# Patient Record
Sex: Male | Born: 1935 | Race: White | Hispanic: No | Marital: Married | Smoking: Former smoker
Health system: Southern US, Community
[De-identification: ages and names within clinical notes are randomized; demographics above are authoritative.]

## PROBLEM LIST (undated history)

## (undated) DIAGNOSIS — Z87898 Personal history of other specified conditions: Secondary | ICD-10-CM

## (undated) DIAGNOSIS — E119 Type 2 diabetes mellitus without complications: Secondary | ICD-10-CM

## (undated) DIAGNOSIS — R011 Cardiac murmur, unspecified: Secondary | ICD-10-CM

## (undated) DIAGNOSIS — N400 Enlarged prostate without lower urinary tract symptoms: Secondary | ICD-10-CM

## (undated) DIAGNOSIS — F0390 Unspecified dementia without behavioral disturbance: Secondary | ICD-10-CM

## (undated) DIAGNOSIS — I219 Acute myocardial infarction, unspecified: Secondary | ICD-10-CM

## (undated) DIAGNOSIS — G20A1 Parkinson's disease without dyskinesia, without mention of fluctuations: Secondary | ICD-10-CM

## (undated) DIAGNOSIS — R269 Unspecified abnormalities of gait and mobility: Secondary | ICD-10-CM

## (undated) DIAGNOSIS — I2510 Atherosclerotic heart disease of native coronary artery without angina pectoris: Secondary | ICD-10-CM

## (undated) DIAGNOSIS — C3490 Malignant neoplasm of unspecified part of unspecified bronchus or lung: Secondary | ICD-10-CM

## (undated) DIAGNOSIS — I10 Essential (primary) hypertension: Secondary | ICD-10-CM

## (undated) DIAGNOSIS — N529 Male erectile dysfunction, unspecified: Secondary | ICD-10-CM

## (undated) DIAGNOSIS — E785 Hyperlipidemia, unspecified: Secondary | ICD-10-CM

## (undated) DIAGNOSIS — G20 Parkinson's disease: Secondary | ICD-10-CM

## (undated) DIAGNOSIS — M722 Plantar fascial fibromatosis: Secondary | ICD-10-CM

## (undated) DIAGNOSIS — H9190 Unspecified hearing loss, unspecified ear: Secondary | ICD-10-CM

## (undated) DIAGNOSIS — K635 Polyp of colon: Secondary | ICD-10-CM

## (undated) DIAGNOSIS — C801 Malignant (primary) neoplasm, unspecified: Secondary | ICD-10-CM

## (undated) HISTORY — DX: Male erectile dysfunction, unspecified: N52.9

## (undated) HISTORY — DX: Polyp of colon: K63.5

## (undated) HISTORY — DX: Personal history of other specified conditions: Z87.898

## (undated) HISTORY — PX: TONSILLECTOMY AND ADENOIDECTOMY: SUR1326

## (undated) HISTORY — DX: Hyperlipidemia, unspecified: E78.5

## (undated) HISTORY — DX: Malignant neoplasm of unspecified part of unspecified bronchus or lung: C34.90

## (undated) HISTORY — DX: Plantar fascial fibromatosis: M72.2

## (undated) HISTORY — DX: Unspecified abnormalities of gait and mobility: R26.9

## (undated) HISTORY — PX: CORONARY ANGIOPLASTY: SHX604

## (undated) HISTORY — DX: Atherosclerotic heart disease of native coronary artery without angina pectoris: I25.10

## (undated) HISTORY — PX: HEMORRHOID SURGERY: SHX153

## (undated) HISTORY — DX: Type 2 diabetes mellitus without complications: E11.9

## (undated) HISTORY — DX: Essential (primary) hypertension: I10

## (undated) HISTORY — PX: OTHER SURGICAL HISTORY: SHX169

---

## 2003-11-13 ENCOUNTER — Inpatient Hospital Stay: Payer: Self-pay | Admitting: Internal Medicine

## 2003-11-14 ENCOUNTER — Other Ambulatory Visit: Payer: Self-pay

## 2003-11-15 ENCOUNTER — Other Ambulatory Visit: Payer: Self-pay

## 2003-12-11 ENCOUNTER — Encounter: Payer: Self-pay | Admitting: Cardiology

## 2004-01-03 ENCOUNTER — Encounter: Payer: Self-pay | Admitting: Cardiology

## 2004-02-03 ENCOUNTER — Encounter: Payer: Self-pay | Admitting: Cardiology

## 2004-03-05 ENCOUNTER — Encounter: Payer: Self-pay | Admitting: Cardiology

## 2005-09-01 ENCOUNTER — Ambulatory Visit: Payer: Self-pay | Admitting: Gastroenterology

## 2008-12-19 ENCOUNTER — Ambulatory Visit: Payer: Self-pay | Admitting: Gastroenterology

## 2008-12-19 LAB — HM COLONOSCOPY

## 2010-09-12 ENCOUNTER — Ambulatory Visit: Payer: Medicare Other | Attending: Neurology | Admitting: Physical Therapy

## 2010-09-12 DIAGNOSIS — G20A1 Parkinson's disease without dyskinesia, without mention of fluctuations: Secondary | ICD-10-CM | POA: Insufficient documentation

## 2010-09-12 DIAGNOSIS — R269 Unspecified abnormalities of gait and mobility: Secondary | ICD-10-CM | POA: Insufficient documentation

## 2010-09-12 DIAGNOSIS — G20 Parkinson's disease: Secondary | ICD-10-CM | POA: Insufficient documentation

## 2010-09-12 DIAGNOSIS — IMO0001 Reserved for inherently not codable concepts without codable children: Secondary | ICD-10-CM | POA: Insufficient documentation

## 2010-09-16 ENCOUNTER — Ambulatory Visit: Payer: Medicare Other | Admitting: Physical Therapy

## 2010-09-18 ENCOUNTER — Ambulatory Visit: Payer: Medicare Other | Admitting: Physical Therapy

## 2010-09-22 ENCOUNTER — Ambulatory Visit: Payer: Medicare Other | Admitting: Physical Therapy

## 2010-09-24 ENCOUNTER — Ambulatory Visit: Payer: Medicare Other | Admitting: Physical Therapy

## 2010-09-26 ENCOUNTER — Ambulatory Visit: Payer: Medicare Other | Admitting: Physical Therapy

## 2010-09-29 ENCOUNTER — Ambulatory Visit: Payer: Medicare Other | Admitting: Physical Therapy

## 2010-10-02 ENCOUNTER — Ambulatory Visit: Payer: Medicare Other | Admitting: Physical Therapy

## 2010-10-07 ENCOUNTER — Ambulatory Visit: Payer: Medicare Other | Attending: Neurology | Admitting: Physical Therapy

## 2010-10-07 DIAGNOSIS — IMO0001 Reserved for inherently not codable concepts without codable children: Secondary | ICD-10-CM | POA: Insufficient documentation

## 2010-10-07 DIAGNOSIS — G20A1 Parkinson's disease without dyskinesia, without mention of fluctuations: Secondary | ICD-10-CM | POA: Insufficient documentation

## 2010-10-07 DIAGNOSIS — G20 Parkinson's disease: Secondary | ICD-10-CM | POA: Insufficient documentation

## 2010-10-07 DIAGNOSIS — R269 Unspecified abnormalities of gait and mobility: Secondary | ICD-10-CM | POA: Insufficient documentation

## 2010-10-09 ENCOUNTER — Ambulatory Visit: Payer: Medicare Other | Admitting: Physical Therapy

## 2010-10-13 ENCOUNTER — Ambulatory Visit: Payer: Medicare Other | Admitting: Physical Therapy

## 2010-10-14 ENCOUNTER — Ambulatory Visit: Payer: Medicare Other | Admitting: Physical Therapy

## 2010-10-15 ENCOUNTER — Ambulatory Visit: Payer: Medicare Other | Admitting: Physical Therapy

## 2010-12-11 ENCOUNTER — Emergency Department (HOSPITAL_COMMUNITY)
Admission: EM | Admit: 2010-12-11 | Discharge: 2010-12-11 | Disposition: A | Discharge status: Home / Self Care | Payer: Medicare Other | Attending: Emergency Medicine | Admitting: Emergency Medicine

## 2010-12-11 ENCOUNTER — Encounter: Payer: Self-pay | Admitting: *Deleted

## 2010-12-11 ENCOUNTER — Emergency Department (HOSPITAL_COMMUNITY): Payer: Medicare Other

## 2010-12-11 DIAGNOSIS — G20A1 Parkinson's disease without dyskinesia, without mention of fluctuations: Secondary | ICD-10-CM | POA: Insufficient documentation

## 2010-12-11 DIAGNOSIS — G20 Parkinson's disease: Secondary | ICD-10-CM | POA: Insufficient documentation

## 2010-12-11 DIAGNOSIS — W19XXXA Unspecified fall, initial encounter: Secondary | ICD-10-CM

## 2010-12-11 DIAGNOSIS — S42009A Fracture of unspecified part of unspecified clavicle, initial encounter for closed fracture: Secondary | ICD-10-CM | POA: Insufficient documentation

## 2010-12-11 DIAGNOSIS — W010XXA Fall on same level from slipping, tripping and stumbling without subsequent striking against object, initial encounter: Secondary | ICD-10-CM | POA: Insufficient documentation

## 2010-12-11 HISTORY — DX: Malignant (primary) neoplasm, unspecified: C80.1

## 2010-12-11 MED ORDER — OXYCODONE-ACETAMINOPHEN 5-325 MG PO TABS: 1.0000 | ORAL_TABLET | ORAL | Status: AC | PRN

## 2010-12-11 NOTE — ED Provider Notes (Signed)
History     CSN: [id number] Arrival date & time: 12/11/2010  3:07 PM   First MD Initiated Contact with Patient 12/11/10 1525      Chief Complaint  Patient presents with  . Fall    left shoulder dislocation     HPI Patient presented via EMS after suffering a mechanical fall.  Patient was trying to tie a shoe on he stumbled and fell striking his left shoulder and head.  Patient has some mild tenderness and swelling to the left shoulder.  Patient denies loss of consciousness, headache, vomiting.  Had some mild nausea.  That has subsided.  Patient has no other complaints of pain.  Patient has history of Parkinson's disease is controlled with medication. Past Medical History  Diagnosis Date  . Cancer   . Blood transfusion   . Parkinson's disease     Past Surgical History  Procedure Date  . Cardicac stent     History reviewed. No pertinent family history.  History  Substance Use Topics  . Smoking status: Never Smoker   . Smokeless tobacco: Never Used  . Alcohol Use: No      Review of Systems  HENT: Negative for neck pain.   Cardiovascular: Negative for chest pain.  All other systems reviewed and are negative.    Allergies  Review of patient's allergies indicates no known allergies.  Home Medications   Current Outpatient Rx  Name Route Sig Dispense Refill  . CLOPIDOGREL BISULFATE 300 MG PO TABS Oral Take by mouth once.      Marland Kitchen PROPOXYPHENE HCL 65 MG PO CAPS Oral Take by mouth every 4 (four) hours as needed.      Marland Kitchen RASAGILINE MESYLATE 0.5 MG PO TABS Oral Take by mouth daily.      Marland Kitchen ROPINIROLE HCL 0.25 MG PO TABS Oral Take by mouth 3 (three) times daily.        BP 139/69  Pulse 69  Temp(Src) 97.8 F (36.6 C) (Oral)  Resp 16  SpO2 95%  Physical Exam  Nursing note and vitals reviewed. Constitutional: He is oriented to person, place, and time. He appears well-developed and well-nourished. No distress.  HENT:  Head: Normocephalic and atraumatic.  Eyes: Pupils  are equal, round, and reactive to light.  Neck: Normal range of motion. No spinous process tenderness and no muscular tenderness present.  Cardiovascular: Normal rate and intact distal pulses.   Pulmonary/Chest: No respiratory distress.  Abdominal: Normal appearance. He exhibits no distension.  Musculoskeletal:       Left shoulder: He exhibits decreased range of motion and tenderness. He exhibits no bony tenderness.  Neurological: He is alert and oriented to person, place, and time. No cranial nerve deficit.  Skin: Skin is warm and dry. No rash noted.  Psychiatric: He has a normal mood and affect. His behavior is normal.    ED Course  Procedures (including critical care time)  Labs Reviewed - No data to display No results found.   No diagnosis found.    MDM          Nelia Shi, MD 12/12/10 [phone]

## 2010-12-11 NOTE — ED Notes (Signed)
Pt was fixing his shoe. Pt fell over and grab the stair rale and hit his shoulder and head. No loc

## 2010-12-11 NOTE — ED Notes (Signed)
Ortho placed sling

## 2010-12-11 NOTE — Progress Notes (Signed)
Orthopedic Tech Progress Note Patient Details:  Craig Dennis 11-17-1933 [id number]     Tawni Carnes [facility] 12/11/2010, 5:01 PM

## 2010-12-11 NOTE — ED Notes (Signed)
Pt was fixing his shoe when he fell over. Attempted to grap the rail lost his balance and fell without any loc

## 2012-03-21 DIAGNOSIS — R269 Unspecified abnormalities of gait and mobility: Secondary | ICD-10-CM | POA: Insufficient documentation

## 2012-03-21 DIAGNOSIS — G20 Parkinson's disease: Secondary | ICD-10-CM | POA: Insufficient documentation

## 2012-08-08 ENCOUNTER — Encounter: Payer: Self-pay | Admitting: Neurology

## 2012-08-08 DIAGNOSIS — R269 Unspecified abnormalities of gait and mobility: Secondary | ICD-10-CM

## 2012-08-08 DIAGNOSIS — G20 Parkinson's disease: Secondary | ICD-10-CM

## 2012-08-09 ENCOUNTER — Ambulatory Visit: Payer: Self-pay | Admitting: Neurology

## 2012-11-03 ENCOUNTER — Encounter: Payer: Self-pay | Admitting: Neurology

## 2012-12-03 ENCOUNTER — Encounter: Payer: Self-pay | Admitting: Neurology

## 2013-01-02 ENCOUNTER — Encounter: Payer: Self-pay | Admitting: Neurology

## 2013-02-02 ENCOUNTER — Encounter: Payer: Self-pay | Admitting: Neurology

## 2013-03-05 ENCOUNTER — Encounter: Payer: Self-pay | Admitting: Neurology

## 2013-05-02 ENCOUNTER — Encounter: Payer: Self-pay | Admitting: Family Medicine

## 2013-05-02 ENCOUNTER — Ambulatory Visit (INDEPENDENT_AMBULATORY_CARE_PROVIDER_SITE_OTHER): Payer: Medicare Other | Admitting: Family Medicine

## 2013-05-02 VITALS — BP 122/68 | HR 75 | Temp 98.2°F | Ht 73.0 in | Wt 222.0 lb

## 2013-05-02 DIAGNOSIS — I2510 Atherosclerotic heart disease of native coronary artery without angina pectoris: Secondary | ICD-10-CM

## 2013-05-02 DIAGNOSIS — R519 Headache, unspecified: Secondary | ICD-10-CM

## 2013-05-02 DIAGNOSIS — E119 Type 2 diabetes mellitus without complications: Secondary | ICD-10-CM

## 2013-05-02 DIAGNOSIS — G20 Parkinson's disease: Secondary | ICD-10-CM

## 2013-05-02 DIAGNOSIS — E785 Hyperlipidemia, unspecified: Secondary | ICD-10-CM

## 2013-05-02 DIAGNOSIS — R51 Headache: Secondary | ICD-10-CM

## 2013-05-02 DIAGNOSIS — I10 Essential (primary) hypertension: Secondary | ICD-10-CM

## 2013-05-02 DIAGNOSIS — E1159 Type 2 diabetes mellitus with other circulatory complications: Secondary | ICD-10-CM

## 2013-05-02 DIAGNOSIS — Z9861 Coronary angioplasty status: Secondary | ICD-10-CM

## 2013-05-02 DIAGNOSIS — I798 Other disorders of arteries, arterioles and capillaries in diseases classified elsewhere: Secondary | ICD-10-CM

## 2013-05-02 NOTE — Patient Instructions (Signed)
Go to the lab on the way out.  We'll contact you with your lab report. Let me review your records and we'll go from there.  Take care.  Glad to see you.

## 2013-05-02 NOTE — Progress Notes (Signed)
Pre visit review using our clinic review tool, if applicable. No additional management support is needed unless otherwise documented below in the visit note.  Diabetes:  Using medications without difficulties:yes Hypoglycemic episodes:no Hyperglycemic episodes:no Feet problems:no Blood Sugars averaging:160 or lower eye exam within last year:yes Due for A1c.   H/o CAD s/p stent w/o CP, SOB, sig BLE edema.  Will review records.   H/o parkinson's followed by neuro. Will review records.  Some tremor.  Occ freezing episodes, worse in crowds.  Gait changes noted.  Compliant with meds.    H/o postconcussive HA, after a deck collapsed.  Had taken prn fiorinal.  Will review records.   PMH and SH reviewed  Meds, vitals, and allergies reviewed.   ROS: See HPI.  Otherwise negative.    GEN: nad, alert and oriented, faint tremor noted in hands.  HEENT: mucous membranes moist NECK: supple w/o LA CV: rrr. PULM: ctab, no inc wob ABD: soft, +bs EXT: trace edema SKIN: no acute rash  Diabetic foot exam: Normal inspection No skin breakdown No calluses  Normal DP pulses Normal sensation to light touch and monofilament Nails normal

## 2013-05-03 ENCOUNTER — Encounter: Payer: Self-pay | Admitting: Family Medicine

## 2013-05-03 DIAGNOSIS — E785 Hyperlipidemia, unspecified: Secondary | ICD-10-CM | POA: Insufficient documentation

## 2013-05-03 DIAGNOSIS — R51 Headache: Secondary | ICD-10-CM

## 2013-05-03 DIAGNOSIS — I10 Essential (primary) hypertension: Secondary | ICD-10-CM | POA: Insufficient documentation

## 2013-05-03 DIAGNOSIS — G8929 Other chronic pain: Secondary | ICD-10-CM | POA: Insufficient documentation

## 2013-05-03 DIAGNOSIS — G20 Parkinson's disease: Secondary | ICD-10-CM | POA: Insufficient documentation

## 2013-05-03 DIAGNOSIS — I2510 Atherosclerotic heart disease of native coronary artery without angina pectoris: Secondary | ICD-10-CM | POA: Insufficient documentation

## 2013-05-03 DIAGNOSIS — Z9861 Coronary angioplasty status: Secondary | ICD-10-CM

## 2013-05-03 DIAGNOSIS — E1159 Type 2 diabetes mellitus with other circulatory complications: Secondary | ICD-10-CM | POA: Insufficient documentation

## 2013-05-03 DIAGNOSIS — E1151 Type 2 diabetes mellitus with diabetic peripheral angiopathy without gangrene: Secondary | ICD-10-CM | POA: Insufficient documentation

## 2013-05-03 DIAGNOSIS — R519 Headache, unspecified: Secondary | ICD-10-CM | POA: Insufficient documentation

## 2013-05-03 LAB — BASIC METABOLIC PANEL
BUN: 18 mg/dL (ref 6–23)
CALCIUM: 10.2 mg/dL (ref 8.4–10.5)
CO2: 29 meq/L (ref 19–32)
CREATININE: 1 mg/dL (ref 0.4–1.5)
Chloride: 98 mEq/L (ref 96–112)
GFR: 75.15 mL/min (ref >60.00)
GLUCOSE: 92 mg/dL (ref 70–99)
Potassium: 4.5 mEq/L (ref 3.5–5.1)
Sodium: 137 mEq/L (ref 135–145)

## 2013-05-03 LAB — HEMOGLOBIN A1C: Hgb A1c MFr Bld: 7.6 % — ABNORMAL HIGH (ref 4.6–6.5)

## 2013-05-03 NOTE — Assessment & Plan Note (Signed)
Will review records. See notes on labs.  No changes in meds at the OV.

## 2013-05-03 NOTE — Assessment & Plan Note (Signed)
No CP. Will review records. See notes on labs.  No changes in meds at the OV.

## 2013-05-03 NOTE — Assessment & Plan Note (Signed)
Will review records. No changes in meds at the OV.

## 2013-05-08 ENCOUNTER — Telehealth: Payer: Self-pay | Admitting: Family Medicine

## 2013-05-08 ENCOUNTER — Encounter: Payer: Self-pay | Admitting: Family Medicine

## 2013-05-08 DIAGNOSIS — I350 Nonrheumatic aortic (valve) stenosis: Secondary | ICD-10-CM | POA: Insufficient documentation

## 2013-05-08 NOTE — Telephone Encounter (Signed)
Call pt.  Records didn't list the dates of his PNA, shingles, and td shots. If he can get [place] the records re: those, it would help.  I need his neuro records re: his HA meds.  Please see if they can send those over. I thought they were already in the records but they aren't.   Thanks.

## 2013-05-09 ENCOUNTER — Encounter: Payer: Self-pay | Admitting: Family Medicine

## 2013-05-09 NOTE — Telephone Encounter (Signed)
Patient states he thinks he has the immunization dates in his records at home and will submit them ASAP.  He has seen an MD at [facility] and will contact them to see about getting the records forwarded to our office.

## 2013-05-09 NOTE — Telephone Encounter (Signed)
Thanks

## 2013-05-15 ENCOUNTER — Other Ambulatory Visit: Payer: Self-pay | Admitting: Family Medicine

## 2013-05-15 MED ORDER — BUTALBITAL-ASPIRIN-CAFFEINE 50-325-40 MG PO CAPS: ORAL_CAPSULE | ORAL | Status: DC

## 2013-05-15 NOTE — Telephone Encounter (Signed)
Please call in.  Had been on prev.  Thanks.

## 2013-05-15 NOTE — Telephone Encounter (Signed)
Refill request from [facility].  RX not on current med list or hx meds.

## 2013-05-16 ENCOUNTER — Telehealth: Payer: Self-pay

## 2013-05-16 NOTE — Telephone Encounter (Signed)
Relevant patient education assigned to patient using Emmi. ° °

## 2013-05-16 NOTE — Telephone Encounter (Signed)
Medication phoned to pharmacy.  

## 2013-05-25 ENCOUNTER — Other Ambulatory Visit: Payer: Self-pay

## 2013-05-25 MED ORDER — SIMVASTATIN 40 MG PO TABS: 40.0000 mg | ORAL_TABLET | Freq: Every day | ORAL | Status: DC

## 2013-05-25 NOTE — Telephone Encounter (Signed)
simva sent, clarify the metoprolol with the patient.

## 2013-05-25 NOTE — Telephone Encounter (Signed)
Mrs Shearn said Dr Damita Dunnings has not prescribed before; request refill simvastatin 40 mg to [facility] and metoprolol ER Suc 25 mg (equivalent to Toprol XL) with instructions take one tab by mouth daily to [facility]. pts med list has metoprolol 50 mg taking one daily.Please advise.

## 2013-05-26 NOTE — Telephone Encounter (Signed)
Left message on patient's voicemail to return call

## 2013-05-29 MED ORDER — METOPROLOL SUCCINATE ER 25 MG PO TB24: 25.0000 mg | ORAL_TABLET | Freq: Every day | ORAL | Status: DC

## 2013-05-29 NOTE — Telephone Encounter (Signed)
Patient's wife says patient is currently taking Metoprolol ER Suc 25 mg. Daily.  Rx sent.

## 2013-06-05 ENCOUNTER — Telehealth: Payer: Self-pay | Admitting: Family Medicine

## 2013-06-05 ENCOUNTER — Encounter: Payer: Self-pay | Admitting: Family Medicine

## 2013-06-05 ENCOUNTER — Ambulatory Visit (INDEPENDENT_AMBULATORY_CARE_PROVIDER_SITE_OTHER): Payer: Medicare Other | Admitting: Family Medicine

## 2013-06-05 VITALS — BP 112/62 | HR 79 | Temp 97.8°F | Wt 220.2 lb

## 2013-06-05 DIAGNOSIS — I2510 Atherosclerotic heart disease of native coronary artery without angina pectoris: Secondary | ICD-10-CM

## 2013-06-05 DIAGNOSIS — Z9861 Coronary angioplasty status: Secondary | ICD-10-CM

## 2013-06-05 DIAGNOSIS — L539 Erythematous condition, unspecified: Secondary | ICD-10-CM | POA: Insufficient documentation

## 2013-06-05 MED ORDER — CEPHALEXIN 500 MG PO CAPS: 500.0000 mg | ORAL_CAPSULE | Freq: Four times a day (QID) | ORAL | Status: DC

## 2013-06-05 NOTE — Patient Instructions (Signed)
Ice and elevate your knee.   Start the antibiotics.   If you have more pain, swelling, spreading redness, or fever, then notify us or go to the ER.  Please update [place] in 1-2 days.   Take care.

## 2013-06-05 NOTE — Telephone Encounter (Signed)
Can you add him on at 3:45?  Thanks.

## 2013-06-05 NOTE — Telephone Encounter (Signed)
Patient Information:  Caller Name: [name]  Phone: [phone]  Patient: Craig Dennis, Craig Dennis  Gender: Male  DOB: Dec 29, 1933  Age: 80 Years  PCP: Elsie Stain Brigitte Pulse) [facility])  Office Follow Up:  Does the office need to follow up with this patient?: Yes  Instructions For The Office: No appt available in office. PLEASE CONTACT REGARDING REDNESS WARMTH and swelling of Knee. Home care advice provided.  RN Note:  No appt available in office. PLEASE CONTACT REGARDING REDNESS WARMTH and swelling of Knee. Home care advice provided.  Symptoms  Reason For Call & Symptoms: She states her husband fell a few week ago 05/24/13 and again on 05/26/13 due to his Parkinsons and balance . He injured his Left knee. She states he had some brusing at the time but it went away along with an abrasions.  He was able to walk on it and not having pain.  She noted on Friday 06/02/13, he complained of pain . It was swollen around the knee cap and behind. Her son-in-law was an EMT and looked at it and told her it was inflammation.  Today there is redness at patella, swollen, warmth noted.  He is able to walk . Denies pain.  Reviewed Health History In EMR: Yes  Reviewed Medications In EMR: Yes  Reviewed Allergies In EMR: Yes  Reviewed Surgeries / Procedures: Yes  Date of Onset of Symptoms: 06/02/2013  Treatments Tried: Ice  Treatments Tried Worked: No  Guideline(s) Used:  Knee Injury  Disposition Per Guideline:   See Today in Office  Reason For Disposition Reached:   Patient wants to be seen  Advice Given:  Apply a Cold Pack:  Apply a cold pack or an ice bag (wrapped in a moist towel) to the area for 20 minutes. Repeat in 1 hour, then every 4 hours while awake.  Elevate the Leg:  Lay down and put your leg on a pillow. This puts (elevates) the knee above the heart.  Do this for 15-20 minutes, 2-3 times a day, for the first two days.  This can also help decrease swelling, bruising, and pain.  Call Back If:  Pain becomes severe  You become worse.  RN Overrode Recommendation:  Make Appointment  No appt available in office. PLEASE CONTACT REGARDING REDNESS WARMTH and swelling of Knee. Home care advice provided.

## 2013-06-05 NOTE — Progress Notes (Signed)
Pre visit review using our clinic review tool, if applicable. No additional management support is needed unless otherwise documented below in the visit note.  L knee red today.  2 weeks ago he fell on the rug and went down on the L knee.  Knee was purple.  Iced down at the time.  Bruising resolved.  Scrape on the knee gradually healed.  Then fell again on the sidewalk at home, again on L knee.  Redness started 06/02/13.  No pain, no complaints of pain.    Redness and swelling are much better today, at least 50% improved, clearly better recently w/o abx treatment.  The skin felt tight prev but not painful o/w.  He has still being going to the gym in the meantime, but not since Friday.    Meds, vitals, and allergies reviewed.   ROS: See HPI.  Otherwise, noncontributory.  nad ncat L knee with normal ROM, not ttp Puffy anteriorly with 10x10cm red patch, warm to touch.  No fluctuant mass.  Not ttp on the joint line or posteriorly

## 2013-06-05 NOTE — Telephone Encounter (Signed)
Wife advised.  Appointment scheduled.

## 2013-06-06 NOTE — Assessment & Plan Note (Signed)
This could be a resolving bursitis, inflammatory. No clear sign for needing I&D now, much improved. Would still start keflex in the meantime, elevate, ice.  If worsening, to ER.  He agrees.  Border marked.

## 2013-06-08 ENCOUNTER — Telehealth: Payer: Self-pay

## 2013-06-08 NOTE — Telephone Encounter (Signed)
Craig Dennis left v/m; pt was seen 06/05/13; pts knee is much better; knee swelling has gone down and is not as red;looks pink today but knee is improving.

## 2013-06-08 NOTE — Telephone Encounter (Signed)
Left detailed message on voicemail.  

## 2013-06-08 NOTE — Telephone Encounter (Signed)
Noted, thanks. Finish the abx and f/u prn. Appreciate the update.

## 2013-07-15 ENCOUNTER — Emergency Department: Payer: Self-pay | Admitting: Emergency Medicine

## 2013-07-15 DIAGNOSIS — N529 Male erectile dysfunction, unspecified: Secondary | ICD-10-CM | POA: Insufficient documentation

## 2013-07-15 LAB — COMPREHENSIVE METABOLIC PANEL
ALBUMIN: 3.8 g/dL (ref 3.4–5.0)
AST: 16 U/L (ref 15–37)
Alkaline Phosphatase: 61 U/L
Anion Gap: 5 — ABNORMAL LOW (ref 7–16)
BILIRUBIN TOTAL: 0.3 mg/dL (ref 0.2–1.0)
BUN: 15 mg/dL (ref 7–18)
CHLORIDE: 101 mmol/L (ref 98–107)
Calcium, Total: 9.5 mg/dL (ref 8.5–10.1)
Co2: 31 mmol/L (ref 21–32)
Creatinine: 0.87 mg/dL (ref 0.60–1.30)
EGFR (African American): >60
EGFR (Non-African Amer.): >60
GLUCOSE: 159 mg/dL — AB (ref 65–99)
Osmolality: 278 (ref 275–301)
POTASSIUM: 4 mmol/L (ref 3.5–5.1)
SGPT (ALT): 10 U/L — ABNORMAL LOW (ref 12–78)
Sodium: 137 mmol/L (ref 136–145)
TOTAL PROTEIN: 7.1 g/dL (ref 6.4–8.2)

## 2013-07-15 LAB — URINALYSIS, COMPLETE
BACTERIA: NONE SEEN
BILIRUBIN, UR: NEGATIVE
BLOOD: NEGATIVE
Glucose,UR: NEGATIVE mg/dL (ref 0–75)
Leukocyte Esterase: NEGATIVE
Nitrite: NEGATIVE
Ph: 6 (ref 4.5–8.0)
Protein: NEGATIVE
SPECIFIC GRAVITY: 1.019 (ref 1.003–1.030)
SQUAMOUS EPITHELIAL: NONE SEEN
WBC UR: 1 /HPF (ref 0–5)

## 2013-07-15 LAB — CBC
HCT: 37.9 % — AB (ref 40.0–52.0)
HGB: 12.9 g/dL — ABNORMAL LOW (ref 13.0–18.0)
MCH: 35.6 pg — AB (ref 26.0–34.0)
MCHC: 34.1 g/dL (ref 32.0–36.0)
MCV: 105 fL — AB (ref 80–100)
Platelet: 187 10*3/uL (ref 150–440)
RBC: 3.63 10*6/uL — ABNORMAL LOW (ref 4.40–5.90)
RDW: 13.1 % (ref 11.5–14.5)
WBC: 10.3 10*3/uL (ref 3.8–10.6)

## 2013-07-15 LAB — TROPONIN I

## 2013-07-15 LAB — PRO B NATRIURETIC PEPTIDE: B-Type Natriuretic Peptide: 258 pg/mL (ref 0–450)

## 2013-07-21 ENCOUNTER — Emergency Department: Payer: Self-pay | Admitting: Emergency Medicine

## 2013-08-01 DIAGNOSIS — I219 Acute myocardial infarction, unspecified: Secondary | ICD-10-CM | POA: Insufficient documentation

## 2013-08-08 ENCOUNTER — Other Ambulatory Visit: Payer: Medicare Other

## 2013-08-15 ENCOUNTER — Ambulatory Visit: Payer: Medicare Other | Admitting: Family Medicine

## 2014-06-18 ENCOUNTER — Other Ambulatory Visit: Payer: Self-pay

## 2014-06-18 ENCOUNTER — Encounter
Admission: RE | Admit: 2014-06-18 | Discharge: 2014-06-18 | Disposition: A | Discharge status: Home / Self Care | Payer: Medicare Other | Source: Ambulatory Visit | Attending: Ophthalmology | Admitting: Ophthalmology

## 2014-06-18 DIAGNOSIS — G20 Parkinson's disease: Secondary | ICD-10-CM | POA: Diagnosis not present

## 2014-06-18 DIAGNOSIS — H2511 Age-related nuclear cataract, right eye: Secondary | ICD-10-CM | POA: Diagnosis not present

## 2014-06-18 DIAGNOSIS — Z955 Presence of coronary angioplasty implant and graft: Secondary | ICD-10-CM | POA: Diagnosis not present

## 2014-06-18 DIAGNOSIS — R011 Cardiac murmur, unspecified: Secondary | ICD-10-CM | POA: Diagnosis not present

## 2014-06-18 DIAGNOSIS — I252 Old myocardial infarction: Secondary | ICD-10-CM | POA: Diagnosis not present

## 2014-06-18 DIAGNOSIS — I10 Essential (primary) hypertension: Secondary | ICD-10-CM | POA: Diagnosis not present

## 2014-06-18 DIAGNOSIS — Z79899 Other long term (current) drug therapy: Secondary | ICD-10-CM | POA: Diagnosis not present

## 2014-06-18 DIAGNOSIS — E119 Type 2 diabetes mellitus without complications: Secondary | ICD-10-CM | POA: Diagnosis not present

## 2014-06-18 DIAGNOSIS — E780 Pure hypercholesterolemia: Secondary | ICD-10-CM | POA: Diagnosis not present

## 2014-06-27 ENCOUNTER — Encounter: Payer: Self-pay | Admitting: *Deleted

## 2014-06-27 DIAGNOSIS — H2511 Age-related nuclear cataract, right eye: Secondary | ICD-10-CM | POA: Diagnosis not present

## 2014-06-28 ENCOUNTER — Encounter: Payer: Self-pay | Admitting: *Deleted

## 2014-06-28 ENCOUNTER — Ambulatory Visit: Payer: Medicare Other | Admitting: Anesthesiology

## 2014-06-28 ENCOUNTER — Encounter
Admission: RE | Disposition: A | Discharge status: Home / Self Care | Payer: Self-pay | Source: Ambulatory Visit | Attending: Ophthalmology

## 2014-06-28 ENCOUNTER — Ambulatory Visit
Admission: RE | Admit: 2014-06-28 | Discharge: 2014-06-28 | Disposition: A | Discharge status: Home / Self Care | Payer: Medicare Other | Source: Ambulatory Visit | Attending: Ophthalmology | Admitting: Ophthalmology

## 2014-06-28 DIAGNOSIS — Z79899 Other long term (current) drug therapy: Secondary | ICD-10-CM | POA: Insufficient documentation

## 2014-06-28 DIAGNOSIS — R011 Cardiac murmur, unspecified: Secondary | ICD-10-CM | POA: Insufficient documentation

## 2014-06-28 DIAGNOSIS — Z955 Presence of coronary angioplasty implant and graft: Secondary | ICD-10-CM | POA: Insufficient documentation

## 2014-06-28 DIAGNOSIS — I252 Old myocardial infarction: Secondary | ICD-10-CM | POA: Insufficient documentation

## 2014-06-28 DIAGNOSIS — H2511 Age-related nuclear cataract, right eye: Secondary | ICD-10-CM | POA: Diagnosis not present

## 2014-06-28 DIAGNOSIS — I10 Essential (primary) hypertension: Secondary | ICD-10-CM | POA: Insufficient documentation

## 2014-06-28 DIAGNOSIS — E119 Type 2 diabetes mellitus without complications: Secondary | ICD-10-CM | POA: Insufficient documentation

## 2014-06-28 DIAGNOSIS — G20 Parkinson's disease: Secondary | ICD-10-CM | POA: Insufficient documentation

## 2014-06-28 DIAGNOSIS — E780 Pure hypercholesterolemia: Secondary | ICD-10-CM | POA: Insufficient documentation

## 2014-06-28 HISTORY — DX: Parkinson's disease: G20

## 2014-06-28 HISTORY — DX: Unspecified hearing loss, unspecified ear: H91.90

## 2014-06-28 HISTORY — PX: CATARACT EXTRACTION W/PHACO: SHX586

## 2014-06-28 HISTORY — DX: Benign prostatic hyperplasia without lower urinary tract symptoms: N40.0

## 2014-06-28 HISTORY — DX: Parkinson's disease without dyskinesia, without mention of fluctuations: G20.A1

## 2014-06-28 HISTORY — DX: Cardiac murmur, unspecified: R01.1

## 2014-06-28 HISTORY — DX: Acute myocardial infarction, unspecified: I21.9

## 2014-06-28 LAB — GLUCOSE, CAPILLARY: GLUCOSE-CAPILLARY: 133 mg/dL — AB (ref 65–99)

## 2014-06-28 SURGERY — PHACOEMULSIFICATION, CATARACT, WITH IOL INSERTION
Anesthesia: Monitor Anesthesia Care | Laterality: Right

## 2014-06-28 MED ORDER — CYCLOPENTOLATE HCL 2 % OP SOLN
1.0000 [drp] | OPHTHALMIC | Status: AC
  Administered 2014-06-28: 1 [drp] via OPHTHALMIC

## 2014-06-28 MED ORDER — LIDOCAINE HCL (PF) 4 % IJ SOLN
INTRAMUSCULAR | Status: AC
  Filled 2014-06-28: qty 5

## 2014-06-28 MED ORDER — MOXIFLOXACIN HCL 0.5 % OP SOLN
OPHTHALMIC | Status: AC
  Administered 2014-06-28: 1 [drp] via OPHTHALMIC
  Filled 2014-06-28: qty 3

## 2014-06-28 MED ORDER — LIDOCAINE HCL (PF) 4 % IJ SOLN
INTRAMUSCULAR | Status: DC | PRN
  Administered 2014-06-28: 1 mL

## 2014-06-28 MED ORDER — TETRACAINE HCL 0.5 % OP SOLN
OPHTHALMIC | Status: AC
  Administered 2014-06-28: 1 [drp] via OPHTHALMIC
  Filled 2014-06-28: qty 2

## 2014-06-28 MED ORDER — PHENYLEPHRINE HCL 10 % OP SOLN
1.0000 [drp] | OPHTHALMIC | Status: AC
  Administered 2014-06-28: 1 [drp] via OPHTHALMIC

## 2014-06-28 MED ORDER — EPINEPHRINE HCL 1 MG/ML IJ SOLN
INTRAMUSCULAR | Status: AC
  Filled 2014-06-28: qty 1

## 2014-06-28 MED ORDER — CEFUROXIME OPHTHALMIC INJECTION 1 MG/0.1 ML
INJECTION | OPHTHALMIC | Status: DC | PRN
  Administered 2014-06-28: 10 mg via INTRACAMERAL

## 2014-06-28 MED ORDER — PHENYLEPHRINE HCL 10 % OP SOLN
OPHTHALMIC | Status: AC
  Administered 2014-06-28: 1 [drp] via OPHTHALMIC
  Filled 2014-06-28: qty 5

## 2014-06-28 MED ORDER — CYCLOPENTOLATE HCL 2 % OP SOLN
OPHTHALMIC | Status: AC
  Administered 2014-06-28: 1 [drp] via OPHTHALMIC
  Filled 2014-06-28: qty 2

## 2014-06-28 MED ORDER — NA HYALUR & NA CHOND-NA HYALUR 0.55-0.5 ML IO KIT
PACK | INTRAOCULAR | Status: AC
  Filled 2014-06-28: qty 1.05

## 2014-06-28 MED ORDER — NA CHONDROIT SULF-NA HYALURON 40-30 MG/ML IO SOLN
INTRAOCULAR | Status: DC | PRN
  Administered 2014-06-28: .35 mL via INTRAOCULAR

## 2014-06-28 MED ORDER — CEFUROXIME OPHTHALMIC INJECTION 1 MG/0.1 ML
INJECTION | OPHTHALMIC | Status: AC
  Filled 2014-06-28: qty 0.1

## 2014-06-28 MED ORDER — BSS IO SOLN
INTRAOCULAR | Status: DC | PRN
  Administered 2014-06-28: 150 mL via INTRAOCULAR

## 2014-06-28 MED ORDER — TETRACAINE HCL 0.5 % OP SOLN
1.0000 [drp] | OPHTHALMIC | Status: DC | PRN
  Administered 2014-06-28: 1 [drp] via OPHTHALMIC

## 2014-06-28 MED ORDER — ALFENTANIL 500 MCG/ML IJ INJ
INJECTION | INTRAMUSCULAR | Status: DC | PRN
  Administered 2014-06-28: 250 ug via INTRAVENOUS

## 2014-06-28 MED ORDER — PROVISC 10 MG/ML IO SOLN
INTRAOCULAR | Status: DC | PRN
  Administered 2014-06-28: .4 mL via INTRAOCULAR

## 2014-06-28 MED ORDER — SODIUM CHLORIDE 0.9 % IV SOLN
INTRAVENOUS | Status: DC
  Administered 2014-06-28 (×2): via INTRAVENOUS

## 2014-06-28 MED ORDER — MOXIFLOXACIN HCL 0.5 % OP SOLN
1.0000 [drp] | OPHTHALMIC | Status: AC
  Administered 2014-06-28: 1 [drp] via OPHTHALMIC

## 2014-06-28 MED ORDER — MOXIFLOXACIN HCL 0.5 % OP SOLN - NO CHARGE
OPHTHALMIC | Status: DC | PRN
  Administered 2014-06-28: 2 [drp]

## 2014-06-28 SURGICAL SUPPLY — 24 items
ACTIVE FMS BASIC CASSETTE ×3 IMPLANT
CUP MEDICINE 2OZ PLAST GRAD ST (MISCELLANEOUS) ×3 IMPLANT
EYE SHIELD UNIVERSAL CLEAR (GAUZE/BANDAGES/DRESSINGS) ×3 IMPLANT
GLOVE BIO SURGEON STRL SZ7 (GLOVE) ×3 IMPLANT
GLOVE SURG LX 6.5 MICRO (GLOVE) ×4
GLOVE SURG LX STRL 6.5 MICRO (GLOVE) ×2 IMPLANT
GOWN STRL REUS W/ TWL LRG LVL3 (GOWN DISPOSABLE) ×2 IMPLANT
GOWN STRL REUS W/TWL LRG LVL3 (GOWN DISPOSABLE) ×4
IOL ×3 IMPLANT
LENS IOL ACRSF IQ PC 24.5 (Intraocular Lens) ×1 IMPLANT
LENS IOL ACRYSOF IQ POST 24.5 (Intraocular Lens) ×3 IMPLANT
NEEDLE FILTER BLUNT 18X 1/2SAF (NEEDLE) ×2
NEEDLE FILTER BLUNT 18X1 1/2 (NEEDLE) ×1 IMPLANT
PACK CATARACT (MISCELLANEOUS) ×3 IMPLANT
PACK CATARACT BRASINGTON LX (MISCELLANEOUS) ×3 IMPLANT
PACK EYE AFTER SURG (MISCELLANEOUS) ×3 IMPLANT
SOL BSS BAG (MISCELLANEOUS) ×3
SOL PREP PVP 2OZ (MISCELLANEOUS) ×3
SOLUTION BSS BAG (MISCELLANEOUS) ×1 IMPLANT
SOLUTION PREP PVP 2OZ (MISCELLANEOUS) ×1 IMPLANT
SYR 3ML LL SCALE MARK (SYRINGE) ×6 IMPLANT
SYR TB 1ML 27GX1/2 LL (SYRINGE) ×3 IMPLANT
WATER STERILE IRR 1000ML POUR (IV SOLUTION) ×3 IMPLANT
WIPE NON LINTING 3.25X3.25 (MISCELLANEOUS) ×3 IMPLANT

## 2014-06-28 NOTE — H&P (Signed)
The history and physical was faxed to the hospital. The history and physical was reviewed by me and no changes have occurred.   

## 2014-06-28 NOTE — Anesthesia Postprocedure Evaluation (Signed)
  Anesthesia Post-op Note  Patient: Craig Dennis.  Procedure(s) Performed: Procedure(s) with comments: CATARACT EXTRACTION PHACO AND INTRAOCULAR LENS PLACEMENT (IOC) (Right) - [place]    1:07.4               AP     12.6             CDE   8.50  Anesthesia type:MAC  Patient location: PACU  Post pain: Pain level controlled  Post assessment: Post-op Vital signs reviewed, Patient's Cardiovascular Status Stable, Respiratory Function Stable, Patent Airway and No signs of Nausea or vomiting  Post vital signs: Reviewed and stable  Last Vitals:  Filed Vitals:   06/28/14 0922  BP: 136/65  Pulse: 63  Temp: 36.7 C  Resp: 16    Level of consciousness: awake, alert  and patient cooperative  Complications: No apparent anesthesia complications

## 2014-06-28 NOTE — Transfer of Care (Signed)
Immediate Anesthesia Transfer of Care Note  Patient: Craig Dennis.  Procedure(s) Performed: Procedure(s) with comments: CATARACT EXTRACTION PHACO AND INTRAOCULAR LENS PLACEMENT (IOC) (Right) - [place]    1:07.4               AP     12.6             CDE   8.50  Patient Location: PACU and Short Stay  Anesthesia Type:MAC  Level of Consciousness: awake, oriented and patient cooperative  Airway & Oxygen Therapy: Patient Spontanous Breathing  Post-op Assessment: Report given to RN, Post -op Vital signs reviewed and stable and Patient moving all extremities X 4  Post vital signs: Reviewed and stable  Last Vitals:  Filed Vitals:   06/28/14 0739  BP: 141/68  Pulse: 69  Temp: 36.4 C  Resp: 16    Complications: No apparent anesthesia complications

## 2014-06-28 NOTE — Discharge Instructions (Signed)
POST OPERATIVE INSTRUCTIONS  DAY OF CATARACT SURGERY Your surgery went well.  Today, take it easy and protect the eye.  Dont bend over at the waist. Dont lift objects heavier than a jug of milk (10lbs). Dont let anything get in the eye other than the drops we give you. Keep the eye shield on at all times except to put in the drops. You may have a mild headache, soreness or scratchy sensation after surgery. EYE DROPS AFTER SURGERY          Durezol Steroid (SHAKE WELL) Ilevro Anti-inflammatory Vigamox Antibiotic       2 times a day Once daily 4 times a day      Wait 5 minutes between drops  If you have questions, call [facility] We will see you tomorrow in the eye clinic. Eye Surgery Discharge Instructions  Expect mild scratchy sensation or mild soreness. DO NOT RUB YOUR EYE!  The day of surgery:  Minimal physical activity, but bed rest is not required  No reading, computer work, or close hand work  No bending, lifting, or straining.  May watch TV  For 24 hours:  No driving, legal decisions, or alcoholic beverages  Safety precautions  Eat anything you prefer: It is better to start with liquids, then soup then solid foods.  _____ Eye patch should be worn until postoperative exam tomorrow.  ____ Solar shield eyeglasses should be worn for comfort in the sunlight/patch while sleeping  Resume all regular medications including aspirin or Coumadin if these were discontinued prior to surgery. You may shower, bathe, shave, or wash your hair. Tylenol may be taken for mild discomfort.  Call your doctor if you experience significant pain, nausea, or vomiting, fever > 101 or other signs of infection. [phone] or [phone] Specific instructions:

## 2014-06-28 NOTE — Op Note (Signed)
  06/28/2014  PRE-OP DIAGNOSIS: Cataract (ICD-10 H25.11) RIGHT EYE  Post operative diagnosis: Cataract (ICD-10 H25.11) RIGHT EYE  Procedure: Phacoemulsification with introcular lens [id number](54008)   SURGEON: Surgeon(s) and Role:    * Lyla Glassing, MD - Primary  ANESTHESIA: Choice   ESTIMATED BLOOD LOSS: MINIMAL  COMPLICATIONS: None  OPERATIVE DESCRIPTION:   Therapeutic options were discussed with the patient preoperatively, including a discussion of risks and benefits of surgery.  Informed consent was obtained. A dilated fundus exam was performed within 6 months.   The patient was premedicated and brought to the operating room and placed on the operating table in the supine position.  Topical tetracaine was instilled.  After adequate anesthesia, the patient was prepped and draped in the usual fashion.  A wire lid speculum was inserted and the microscope was positioned.  A sideport was used to create a paracentesis site and a mixture of preservative-free lidocaine, BSS, and epinephrine was was instilled into the anterior chamber, followed by viscoelastic.  A clear corneal incision was created using a keratome blade.  Capsulorrhexis was then performed.  In situ phacoemulsification was performed.  Cortical material was removed with the irrigation-aspiration unit.  Viscoelastic was instilled to open the capsular bag.  A posterior chamber intraocular lens, model 24.5 diopters, was inserted and positioned.  Irrigation-aspiration was used to remove all viscoelastic. Intracameral cefuroxime was injected into the eye. Wounds were checked for leakage and confirmed to be secure.  Lid speculum was removed and a shield was placed over the eye.  Patient was returned to the recovery room in stable condition. IMPLANTS:   Implant Name Type Inv. Item Serial No. Manufacturer Lot No. LRB No. Used  IOL     [id number] ALCON   Right 1     Postoperative care and discharge medication counseling was  discussed with the patient or the parents prior to discharge

## 2014-06-28 NOTE — Anesthesia Postprocedure Evaluation (Signed)
    Anesthesia Post-op Note  Patient: Craig Dennis.  Procedure(s) Performed: Procedure(s) with comments: CATARACT EXTRACTION PHACO AND INTRAOCULAR LENS PLACEMENT (IOC) (Right) - [place]    1:07.4               AP     12.6             CDE   8.50  Anesthesia type:MAC  Patient location: PACU  Post pain: Pain level controlled  Post assessment: Post-op Vital signs reviewed, Patient's Cardiovascular Status Stable, Respiratory Function Stable, Patent Airway and No signs of Nausea or vomiting  Post vital signs: Reviewed and stable  Last Vitals:  Filed Vitals:   06/28/14 0922  BP: 136/65  Pulse: 63  Temp: 36.7 C  Resp: 16    Level of consciousness: awake, alert  and patient cooperative  Complications: No apparent anesthesia complications

## 2014-06-28 NOTE — Anesthesia Preprocedure Evaluation (Addendum)
Anesthesia Evaluation  Patient identified by MRN, date of birth, ID band Patient awake    Reviewed: Allergy & Precautions, NPO status , Patient's Chart, lab work & pertinent test results  History of Anesthesia Complications Negative for: history of anesthetic complications  Airway Mallampati: III  TM Distance: >3 FB Neck ROM: Full    Dental  (+) Lower Dentures, Upper Dentures   Pulmonary former smoker (remote),          Cardiovascular hypertension (pt not on beta blocker for last year), Pt. on medications + CAD, + Past MI (s/p stent ) and + Peripheral Vascular Disease     Neuro/Psych  Neuromuscular disease (parkinsons dz)    GI/Hepatic   Endo/Other  diabetes, Type 2, Oral Hypoglycemic Agents  Renal/GU      Musculoskeletal   Abdominal   Peds  Hematology   Anesthesia Other Findings   Reproductive/Obstetrics                           Anesthesia Physical Anesthesia Plan  ASA: III  Anesthesia Plan: MAC   Post-op Pain Management:    Induction:   Airway Management Planned:   Additional Equipment:   Intra-op Plan:   Post-operative Plan:   Informed Consent: I have reviewed the patients History and Physical, chart, labs and discussed the procedure including the risks, benefits and alternatives for the proposed anesthesia with the patient or authorized representative who has indicated his/her understanding and acceptance.     Plan Discussed with:   Anesthesia Plan Comments:         Anesthesia Quick Evaluation

## 2014-07-06 ENCOUNTER — Encounter: Payer: Self-pay | Admitting: Ophthalmology

## 2014-07-16 ENCOUNTER — Ambulatory Visit: Payer: Medicare Other | Attending: Neurology | Admitting: Occupational Therapy

## 2014-07-16 DIAGNOSIS — R296 Repeated falls: Secondary | ICD-10-CM

## 2014-07-16 DIAGNOSIS — G20 Parkinson's disease: Secondary | ICD-10-CM | POA: Diagnosis present

## 2014-07-16 DIAGNOSIS — M6281 Muscle weakness (generalized): Secondary | ICD-10-CM

## 2014-07-16 DIAGNOSIS — R262 Difficulty in walking, not elsewhere classified: Secondary | ICD-10-CM

## 2014-07-16 DIAGNOSIS — R279 Unspecified lack of coordination: Secondary | ICD-10-CM

## 2014-07-17 ENCOUNTER — Encounter: Payer: Self-pay | Admitting: Occupational Therapy

## 2014-07-17 ENCOUNTER — Ambulatory Visit: Payer: Medicare Other | Admitting: Occupational Therapy

## 2014-07-17 DIAGNOSIS — R262 Difficulty in walking, not elsewhere classified: Secondary | ICD-10-CM

## 2014-07-17 DIAGNOSIS — G20 Parkinson's disease: Secondary | ICD-10-CM | POA: Diagnosis not present

## 2014-07-17 DIAGNOSIS — R296 Repeated falls: Secondary | ICD-10-CM

## 2014-07-17 DIAGNOSIS — R279 Unspecified lack of coordination: Secondary | ICD-10-CM

## 2014-07-17 DIAGNOSIS — M6281 Muscle weakness (generalized): Secondary | ICD-10-CM

## 2014-07-17 NOTE — Therapy (Signed)
[place] MAIN [facility] SERVICES [address] [place], [place], [address] Phone: [phone]   Fax:  [phone]  Occupational Therapy Evaluation  Patient Details  Name: Craig Dennis. MRN: [id number] Date of Birth: Nov 02, 1933 Referring Provider:  Vladimir Crofts, MD  Encounter Date: 07/16/2014      OT End of Session - 07/16/14 1458    Visit Number 1   Number of Visits 17   Date for OT Re-Evaluation 08/20/14   Authorization Type medicare G code 1   Authorization Time Period 10   OT Start Time 1415   OT Stop Time 1549   OT Time Calculation (min) 94 min   Behavior During Therapy [facility] for tasks assessed/performed      Past Medical History  Diagnosis Date  . Parkinson's disease     Primarily right sided features  . Dyslipidemia   . History of chest pain   . Diabetes   . History of headache     chronic, recurrent, postconcussive, after fall from a collapsed deck, had been treated wtih prn fiorinal  . Hypertension   . Erectile dysfunction   . Colon polyps   . CAD (coronary artery disease)   . Plantar fasciitis   . Gait disorder   . Myocardial infarction   . Heart murmur   . Parkinson's disease   . BPH (benign prostatic hyperplasia)   . HOH (hard of hearing)   . Cancer     skin    Past Surgical History  Procedure Laterality Date  . Cardicac stent    . Hemorrhoid surgery    . Tonsillectomy and adenoidectomy    . Coronary angioplasty      stent  . Cataract extraction w/phaco Right 06/28/2014    Procedure: CATARACT EXTRACTION PHACO AND INTRAOCULAR LENS PLACEMENT (IOC);  Surgeon: Lyla Glassing, MD;  Location: [facility] ORS;  Service: Ophthalmology;  Laterality: Right;  [place]    1:07.4               AP     12.6             CDE   8.50    There were no vitals filed for this visit.  Visit Diagnosis:  Muscle weakness (generalized) - Plan: Ot plan of care cert/re-cert  Lack of coordination - Plan: Ot plan of care  cert/re-cert  Difficulty walking - Plan: Ot plan of care cert/re-cert  Falls frequently - Plan: Ot plan of care cert/re-cert      Subjective Assessment - 07/16/14 1429    Subjective  Patient reports he was seen in the past for LSVT BIG and LOUD and made good progress, "I was with Minette Headland the last time and I did good, I didn't have to use a walker then. "  Patient reports he has had a recent decline in balance, functional mobility and has had an increase in his freezing episodes during mobility.  He has not been able to get back to playing golf.  He reports he was going to the gym at one time and walking 2 miles a day on the treadmill and using the bike but has not been in a while.     Patient is accompained by: Family member   Pertinent History Patient has had Parkinson's for many years and has been seen in the past for LSVT BIG and had good success with the program.  Patient has had a recent decline in function with changes as his  disease progresses.  He has had up to 8 reported falls in the last 6-12 months.  He tends to fall forwards and often to his knees.  He reports his mobility is the worst when he is confronted by a crowd of people going into and coming out of restaurants and tends to freeze, go onto his toes and has the tendency to fall forwards.  He has had 2 episodes of blacking out in the past year, once while eating hot dogs at a local restaurant and then another time while at a cookout eating a hamburger.  He has recently converted to using a rollator for his functional mobility but does not feel comfortable yet with using. His wife reports he tends to push the rollator forwards too far and freezes and ends up falling forwards.    Limitations disease progression, freezing of gait behaviors, fall risk   Special Tests 6 minute walk test, 5 times sit to stand, BERG balance test   Patient Stated Goals Patient reports he would like to be as independent as possible, he wants to get back to  going to the gym and walking on the treadmill and biking.  He wants his legs to be stronger, his balance better and safe with walking.  He would love to play golf again.     Currently in Pain? No/denies   Multiple Pain Sites No           [facility] OT Assessment - 07/17/14 1439    Assessment   Diagnosis Parkinson's disease   Onset Date 07/15/00   Prior Therapy LSVT BIG and LOUD in the past in 2014   Precautions   Precautions Fall   Precaution Comments Patient tends to fall forwards, freezing of gait   Restrictions   Weight Bearing Restrictions No   Balance Screen   Has the patient fallen in the past 6 months Yes   How many times? 8   Has the patient had a decrease in activity level because of a fear of falling?  Yes   Is the patient reluctant to leave their home because of a fear of falling?  Yes  tends to freeze and not do well in crowds   [place] expects to be discharged to: Private residence   Living Arrangements Spouse/significant other   Available Help at Discharge Family   Type of [place] One level   [place] - Number of Steps 4   Bathroom Shower/Tub Walk-in Shower   Bathroom Toilet Handicapped height   Bathroom Accessibility Yes   How accessible Accessible via Diplomatic Services operational officer - 2 wheels;Walker - 4 wheels;Cane - single point;Grab bars - toilet;Grab bars - tub/shower;Toilet riser;Tub bench   Lives With Spouse   Prior Function   Level of Independence Independent with basic ADLs;Independent with household mobility without device;Independent with community mobility with device;Needs assistance with homemaking   Vocation Retired   Leisure loves to golf but hasn't been able to recently   ADL   Eating/Feeding Independent   Grooming Modified independent   Upper Body Bathing Modified independent  increased time to complete   Lower Body Bathing Increased time   Upper Body Dressing  Needs assist for fasteners;Increased time   Lower Body Dressing Increased time   Toilet Tranfer Modified independent   Toileting - Clothing Manipulation Modified independent;Increased time   Warden/ranger Details (  indicate cue type and reason supervision for transfer for safety   Transfers/Ambulation Related to ADL's Patient has difficulty with transfer out of bed and tends to turn, slide off the bed onto his knees and pull up using the bedside table.   ADL comments Patient has difficulty with buttons, requires increased time to complete all tasks, if going to appts, patient requires assistance to get ready by a designated time.   IADL   Prior Level of Function Shopping was able to previously go with wife and Administrator, Civil Service for transportation;Needs to be accompanied on any shopping trip   Light Housekeeping Performs light daily tasks such as dishwashing, bed making  Patient can no longer perform yard work   Meal Prep Able to complete simple warm meal prep;Able to complete simple cold meal and snack prep   Community Mobility Relies on family or friends for transportation   Medication Management Takes responsibility if medication is prepared in advance in seperate dosage   Mobility   Mobility Status Freezing;History of falls   Mobility Status Comments now using a rollator the last 6 weeks   Written Expression   Dominant Hand Right   Handwriting 50% legible   Coordination   Gross Motor Movements are Fluid and Coordinated No   Fine Motor Movements are Fluid and Coordinated No   Finger Nose Finger Test impaired   9 Hole Peg Test Right;Left   Right 9 Hole Peg Test 30   Left 9 Hole Peg Test 27   ROM / Strength   AROM / PROM / Strength AROM;Strength   AROM   Overall AROM  Within functional limits for tasks performed   Overall AROM Comments movements are not fluid but WFLs   Strength   Overall Strength Deficits   Overall Strength  Comments 4/5 strength UB/LB       Grip strength right 77#, Left 81#  Recent weight gain from 185# to 220#. 6 minute walk test 1340 feet 5 times sit to stand 17 secs Freezing of Gait questionairre 15 BERG 42/56.     Patient has a current abrasion to his left knee from a recent fall.   Gait pattern:  Patient now requires a rollator for functional mobility, has significant freezing of gait with initiation of movement, turning, moving in small spaces, navigating in crowds and with changes in flooring materials and color schemes.  He tends to walk on his toes at times and when he freezes his rollator pushes forwards and he falls to his knees when he cannot initiate the step forward.    He is unable to get out of bed without using a non conventional method of sliding out of the bed onto his knees and then pulling up from the night stand.                  OT Education - 07/16/14 1458    Education provided Yes   Education Details LSVT BIG protocol, role of OT   Person(s) Educated Patient;Spouse   Methods Explanation   Comprehension Verbalized understanding             OT Long Term Goals - 07/17/14 1510    OT LONG TERM GOAL #1   Title Patient will decrease frequency of freezing episodes with score of 11 or less on Freezing of Gait Questionnaire in 4 weeks.   Baseline significant freezing behaviors, score of 15 on Freezing of Gait Questionairre   Time 4   Period  Weeks   Status New   OT LONG TERM GOAL #2   Title Patient will improve gait speed and endurance and be able to walk 1500 feet in 6 minutes to negotiate around the home and community safely in 4 weeks.   Baseline Patient now limits his community activities, avoids crowds if possible. Ambulating 1340 feet.    Time 4   Period Weeks   Status New   OT LONG TERM GOAL #3   Title Patient will complete HEP for maximal daily exercises with modified independence in 4 weeks.   Baseline is performing some of the  exercises at home but not with correct form, speed or accuracy.   Time 4   Period Weeks   Status New   OT LONG TERM GOAL #4   Title Patient will transfer from sit to stand without the use of arms safely and independently from a variety of chairs/surfaces in 4 weeks.    Baseline tends to use arms and has difficulty with lower surface heights.    Time 4   Period Weeks   Status New   OT LONG TERM GOAL #5   Title Patient will demonstrate the ability to navigate through a crowd in a restaurant with minimal freezing of gait and no falls with cues if needed.   Baseline significant freezing behaviors now in restaurants and crowds   Time 4   Period Weeks   Status New               Plan - 07-18-14 1500    Clinical Impression Statement Patient is a 81 yo male diagnosed with Parkinson's disease and was referred by his physician for LSVT BIG program. Patient presents with decreased step length with gait patterns, decreased reciprocal arm swing, decreased balance, history of falls, significant freezing behaviors with initiation of movement and turns, decreased coordination, muscle strength which affect his ability to perform daily tasks. The patient is judged to be an excellent candidate for the LSVT BIG program. He would benefit from and was referred for the LSVT BIG program which is an intensive program designed specifically for Parkinson's patients with a focus on increasing amplitude and speed of movements, improving self care and daily tasks and providing patients with daily exercises to improve overall function. It is recommended that the patient receive the LSVT BIG program which is comprised of 16 intensive sessions (4 times per week for 4 weeks, one hour sessions). Prognosis for improvement is good based on his motivation and strong family support. LSVT BIG has been documented in the literature as efficacious for individuals with Parkinson's disease.    Pt will benefit from skilled therapeutic  intervention in order to improve on the following deficits (Retired) Abnormal gait;Decreased strength;Decreased knowledge of use of DME;Difficulty walking;Decreased mobility;Impaired UE functional use;Decreased balance;Decreased coordination;Decreased endurance   Rehab Potential Good   OT Frequency 4x / week   OT Duration 4 weeks   OT Treatment/Interventions Self-care/ADL training;Gait Training;Functional Mobility Training;Therapeutic exercises;Neuromuscular education;Patient/family education;DME and/or AE instruction;Balance training   Plan Patient seen for evaluation and will be seen 4x a week for 4 weeks for 16 treatment sessions as per LSVT BIG protocol.   OT Home Exercise Plan Patient will be required to perform daily homework/exercise tasks as a part of the program.   Consulted and Agree with Plan of Care Patient;Family member/caregiver   Family Member Consulted wife          G-Codes - 07/18/14 1516    Functional Assessment  Tool Used clinical judgment, 6 minute walk test, BERG balance, freezing of gait questionairre, self care assessment, 5 times sit to stand   Functional Limitation Mobility: Walking and moving around   Mobility: Walking and Moving Around Current Status [phone]) At least 40 percent but less than 60 percent impaired, limited or restricted   Mobility: Walking and Moving Around Goal Status [phone]) At least 20 percent but less than 40 percent impaired, limited or restricted      Problem List Patient Active Problem List   Diagnosis Date Noted  . Skin erythema 06/05/2013  . Aortic stenosis 05/08/2013  . CAD S/P percutaneous coronary angioplasty 05/03/2013  . HTN (hypertension) 05/03/2013  . Type 2 diabetes mellitus with vascular disease 05/03/2013  . HLD (hyperlipidemia) 05/03/2013  . Parkinson disease 05/03/2013  . Chronic headache 05/03/2013  . Abnormality of gait 03/21/2012  . Paralysis agitans 03/21/2012    Chaitanya Amedee 07/17/2014, 3:29 PM  [place] MAIN [facility] SERVICES [address] [place], [place], [address] Phone: [phone]   Fax:  [phone]

## 2014-07-18 ENCOUNTER — Ambulatory Visit: Payer: Medicare Other | Admitting: Occupational Therapy

## 2014-07-18 DIAGNOSIS — M6281 Muscle weakness (generalized): Secondary | ICD-10-CM

## 2014-07-18 DIAGNOSIS — R279 Unspecified lack of coordination: Secondary | ICD-10-CM

## 2014-07-18 DIAGNOSIS — R262 Difficulty in walking, not elsewhere classified: Secondary | ICD-10-CM

## 2014-07-18 DIAGNOSIS — G20 Parkinson's disease: Secondary | ICD-10-CM | POA: Diagnosis not present

## 2014-07-18 NOTE — Therapy (Signed)
[place] MAIN [facility] SERVICES [address] [place], [place], [address] Phone: [phone]   Fax:  [phone]  Occupational Therapy Treatment  Patient Details  Name: Craig Dennis. MRN: [id number] Date of Birth: 1933-05-23 Referring Provider:  Anabel Bene, MD  Encounter Date: 07/17/2014      OT End of Session - 07/17/14 1035    Visit Number 2   Number of Visits 17   Date for OT Re-Evaluation 08/20/14   Authorization Type medicare G code 2   Authorization Time Period 10   OT Start Time 0930   OT Stop Time 1040   OT Time Calculation (min) 70 min   Activity Tolerance Patient tolerated treatment well   Behavior During Therapy [facility] for tasks assessed/performed      Past Medical History  Diagnosis Date  . Parkinson's disease     Primarily right sided features  . Dyslipidemia   . History of chest pain   . Diabetes   . History of headache     chronic, recurrent, postconcussive, after fall from a collapsed deck, had been treated wtih prn fiorinal  . Hypertension   . Erectile dysfunction   . Colon polyps   . CAD (coronary artery disease)   . Plantar fasciitis   . Gait disorder   . Myocardial infarction   . Heart murmur   . Parkinson's disease   . BPH (benign prostatic hyperplasia)   . HOH (hard of hearing)   . Cancer     skin    Past Surgical History  Procedure Laterality Date  . Cardicac stent    . Hemorrhoid surgery    . Tonsillectomy and adenoidectomy    . Coronary angioplasty      stent  . Cataract extraction w/phaco Right 06/28/2014    Procedure: CATARACT EXTRACTION PHACO AND INTRAOCULAR LENS PLACEMENT (IOC);  Surgeon: Lyla Glassing, MD;  Location: [facility] ORS;  Service: Ophthalmology;  Laterality: Right;  [place]    1:07.4               AP     12.6             CDE   8.50    There were no vitals filed for this visit.  Visit Diagnosis:  Difficulty walking  Muscle weakness (generalized)  Lack of  coordination  Falls frequently      Subjective Assessment - 07/17/14 0931    Subjective   Patient reports he already felt better yesterday after his evaluation.  I enjoyed yesterday and I think this will be good for me to get back to.  I know you can help me, I have confidence in you."  Reports he was tired after 6 minute walk test yesterday but was ok.   Patient is accompained by: Family member   Limitations disease progression, freezing of gait behaviors, fall risk   Patient Stated Goals Patient reports he would like to be as independent as possible, he wants to get back to going to the gym and walking on the treadmill and biking.  He wants his legs to be stronger, his balance better and safe with walking.  He would love to play golf again.     Currently in Pain? No/denies   Multiple Pain Sites No            [facility] OT Assessment - 07/17/14 1439    Assessment   Diagnosis Parkinson's disease   Onset Date 07/15/00  Prior Therapy LSVT BIG and LOUD in the past in 2014   Precautions   Precautions Fall   Precaution Comments Patient tends to fall forwards, freezing of gait   Restrictions   Weight Bearing Restrictions No   Balance Screen   Has the patient fallen in the past 6 months Yes   How many times? 8   Has the patient had a decrease in activity level because of a fear of falling?  Yes   Is the patient reluctant to leave their home because of a fear of falling?  Yes  tends to freeze and not do well in crowds   [place] expects to be discharged to: Private residence   Living Arrangements Spouse/significant other   Available Help at Discharge Family   Type of [place] One level   [place] - Number of Steps 4   Bathroom Shower/Tub Walk-in Shower   Bathroom Toilet Handicapped height   Bathroom Accessibility Yes   How accessible Accessible via [employer] operational officer - 2 wheels;Walker - 4  wheels;Cane - single point;Grab bars - toilet;Grab bars - tub/shower;Toilet riser;Tub bench   Lives With Spouse   Prior Function   Level of Independence Independent with basic ADLs;Independent with household mobility without device;Independent with community mobility with device;Needs assistance with homemaking   Vocation Retired   Leisure loves to golf but hasn't been able to recently   ADL   Eating/Feeding Independent   Grooming Modified independent   Upper Body Bathing Modified independent  increased time to complete   Lower Body Bathing Increased time   Upper Body Dressing Needs assist for fasteners;Increased time   Lower Body Dressing Increased time   Toilet Tranfer Modified independent   Toileting - Clothing Manipulation Modified independent;Increased time   Warden/ranger Details (indicate cue type and reason supervision for transfer for safety   Transfers/Ambulation Related to ADL's Patient has difficulty with transfer out of bed and tends to turn, slide off the bed onto his knees and pull up using the bedside table.   ADL comments Patient has difficulty with buttons, requires increased time to complete all tasks, if going to appts, patient requires assistance to get ready by a designated time.   IADL   Prior Level of Function Shopping was able to previously go with wife and Administrator, Civil Service for transportation;Needs to be accompanied on any shopping trip   Light Housekeeping Performs light daily tasks such as dishwashing, bed making  Patient can no longer perform yard work   Meal Prep Able to complete simple warm meal prep;Able to complete simple cold meal and snack prep   Community Mobility Relies on family or friends for transportation   Medication Management Takes responsibility if medication is prepared in advance in seperate dosage   Mobility   Mobility Status Freezing;History of falls   Mobility Status Comments now  using a rollator the last 6 weeks   Written Expression   Dominant Hand Right   Handwriting 50% legible   Coordination   Gross Motor Movements are Fluid and Coordinated No   Fine Motor Movements are Fluid and Coordinated No   Finger Nose Finger Test impaired   9 Hole Peg Test Right;Left   Right 9 Hole Peg Test 30   Left 9 Hole Peg Test 27   ROM / Strength   AROM /  PROM / Strength AROM;Strength   AROM   Overall AROM  Within functional limits for tasks performed   Overall AROM Comments movements are not fluid but WFLs   Strength   Overall Strength Deficits   Overall Strength Comments 4/5 strength UB/LB                  OT Treatments/Exercises (OP) - 07/17/14 1531    Neurological Re-education Exercises   Other Exercises 1 LSVT Daily Session Maximal Daily Exercises: Sustained movements are designed to rescale the amplitude of movement output for generalization to daily functional activities. Performed as follows for 1 set of 10 repetitions each: Multi directional sustained movements- 1) Floor to ceiling, 2) Side to side. Multi directional Repetitive movements performed in standing and are designed to provide retraining effort needed for sustained muscle activation in tasks Performed as follows: 3) Step and reach forward, 4) Step and Reach Backwards, 5) Step and reach sideways, 6) Rock and reach forward/backward, 7) Rock and reach sideways. Sit to stand from mat table on lowest setting with cues for weight shift, technique and CGA for 5 reps for 2 sets. Patient seen for functional mobility tasks this date with emphasis on gait speed, length of steps with short distance ambulation, 150 feet for 4 trials with assistive device. Patient requires cues for BIG movements and cadence.                  OT Education - 07/17/14 1030    Education provided Yes   Education Details LSVT BIG maximal daily exercises, discussed component tasks.   Person(s) Educated Patient;Spouse   Methods  Explanation;Demonstration;Verbal cues;Tactile cues   Comprehension Verbalized understanding;Returned demonstration;Verbal cues required;Tactile cues required;Need further instruction             OT Long Term Goals - 07/17/14 1510    OT LONG TERM GOAL #1   Title Patient will decrease frequency of freezing episodes with score of 11 or less on Freezing of Gait Questionnaire in 4 weeks.   Baseline significant freezing behaviors, score of 15 on Freezing of Gait Questionairre   Time 4   Period Weeks   Status New   OT LONG TERM GOAL #2   Title Patient will improve gait speed and endurance and be able to walk 1500 feet in 6 minutes to negotiate around the home and community safely in 4 weeks.   Baseline Patient now limits his community activities, avoids crowds if possible. Ambulating 1340 feet.    Time 4   Period Weeks   Status New   OT LONG TERM GOAL #3   Title Patient will complete HEP for maximal daily exercises with modified independence in 4 weeks.   Baseline is performing some of the exercises at home but not with correct form, speed or accuracy.   Time 4   Period Weeks   Status New   OT LONG TERM GOAL #4   Title Patient will transfer from sit to stand without the use of arms safely and independently from a variety of chairs/surfaces in 4 weeks.    Baseline tends to use arms and has difficulty with lower surface heights.    Time 4   Period Weeks   Status New   OT LONG TERM GOAL #5   Title Patient will demonstrate the ability to navigate through a crowd in a restaurant with minimal freezing of gait and no falls with cues if needed.   Baseline significant freezing behaviors now in restaurants  and crowds   Time 4   Period Weeks   Status New               Plan - 07/17/14 1040    Clinical Impression Statement Although patient is familiar with LSVT BIG maximal daily exercises, he has been performing at home with increased speed, decreased quality of movement and improper  form.  He required moderate cues this date, both verbal and tactile for correct form and to hold sustained exercises.  He will require additional focus on this since he has developed extensive habits on performing the exercises at home to "get them done" and lacks form  and quality of movement to produce maximal results for this program and reduce his risk for falls.  He did lose his balance 2x during exercises and required assistance from therapist for recovery.    Pt will benefit from skilled therapeutic intervention in order to improve on the following deficits (Retired) Abnormal gait;Decreased strength;Decreased knowledge of use of DME;Difficulty walking;Decreased mobility;Impaired UE functional use;Decreased balance;Decreased coordination;Decreased endurance   Rehab Potential Good   Clinical Impairments Affecting Rehab Potential freezing of gait   OT Frequency 4x / week   OT Duration 4 weeks   OT Treatment/Interventions Self-care/ADL training;Gait Training;Functional Mobility Training;Therapeutic exercises;Neuromuscular education;Patient/family education;DME and/or AE instruction;Balance training   Plan Formulate component tasks to work on for the coming week.   OT Home Exercise Plan Patient will be required to perform daily homework/exercise tasks as a part of the program.        Problem List Patient Active Problem List   Diagnosis Date Noted  . Skin erythema 06/05/2013  . Aortic stenosis 05/08/2013  . CAD S/P percutaneous coronary angioplasty 05/03/2013  . HTN (hypertension) 05/03/2013  . Type 2 diabetes mellitus with vascular disease 05/03/2013  . HLD (hyperlipidemia) 05/03/2013  . Parkinson disease 05/03/2013  . Chronic headache 05/03/2013  . Abnormality of gait 03/21/2012  . Paralysis agitans 03/21/2012   Achilles Dunk, OTR/L, CLT Lovett,Amy 07/18/2014, 9:44 AM  [place] MAIN [facility] SERVICES [address] [place], [place],  [address] Phone: [phone]   Fax:  [phone]

## 2014-07-19 ENCOUNTER — Encounter: Payer: Self-pay | Admitting: Occupational Therapy

## 2014-07-19 ENCOUNTER — Ambulatory Visit: Payer: Medicare Other | Admitting: Occupational Therapy

## 2014-07-19 DIAGNOSIS — M6281 Muscle weakness (generalized): Secondary | ICD-10-CM

## 2014-07-19 DIAGNOSIS — R296 Repeated falls: Secondary | ICD-10-CM

## 2014-07-19 DIAGNOSIS — G20 Parkinson's disease: Secondary | ICD-10-CM | POA: Diagnosis not present

## 2014-07-19 DIAGNOSIS — R279 Unspecified lack of coordination: Secondary | ICD-10-CM

## 2014-07-19 DIAGNOSIS — R262 Difficulty in walking, not elsewhere classified: Secondary | ICD-10-CM

## 2014-07-19 NOTE — Therapy (Signed)
[place] MAIN [facility] SERVICES [address] [place], [place], [address] Phone: [phone]   Fax:  [phone]  Occupational Therapy Treatment  Patient Details  Name: Craig Dennis. MRN: [id number] Date of Birth: 08/21/1933 Referring Provider:  Idelle Crouch, MD  Encounter Date: 07/18/2014      OT End of Session - 07/18/14 1700    Visit Number 3   Number of Visits 17   Date for OT Re-Evaluation 08/20/14   Authorization Type medicare G code 3   Authorization Time Period 10   OT Start Time 1355   OT Stop Time 1500   OT Time Calculation (min) 65 min   Activity Tolerance Patient tolerated treatment well   Behavior During Therapy [facility] for tasks assessed/performed      Past Medical History  Diagnosis Date  . Parkinson's disease     Primarily right sided features  . Dyslipidemia   . History of chest pain   . Diabetes   . History of headache     chronic, recurrent, postconcussive, after fall from a collapsed deck, had been treated wtih prn fiorinal  . Hypertension   . Erectile dysfunction   . Colon polyps   . CAD (coronary artery disease)   . Plantar fasciitis   . Gait disorder   . Myocardial infarction   . Heart murmur   . Parkinson's disease   . BPH (benign prostatic hyperplasia)   . HOH (hard of hearing)   . Cancer     skin    Past Surgical History  Procedure Laterality Date  . Cardicac stent    . Hemorrhoid surgery    . Tonsillectomy and adenoidectomy    . Coronary angioplasty      stent  . Cataract extraction w/phaco Right 06/28/2014    Procedure: CATARACT EXTRACTION PHACO AND INTRAOCULAR LENS PLACEMENT (IOC);  Surgeon: Lyla Glassing, MD;  Location: [facility] ORS;  Service: Ophthalmology;  Laterality: Right;  [place]    1:07.4               AP     12.6             CDE   8.50    There were no vitals filed for this visit.  Visit Diagnosis:  Difficulty walking  Muscle weakness (generalized)  Lack of coordination       Subjective Assessment - 07/18/14 1602    Subjective  Patient reports he is a little sore this date.  His left shoulder is a little sore and reports a prior rotator cuff issue.  Reports he would like to try to go without the rollator today.   Patient is accompained by: Family member   Limitations disease progression, freezing of gait behaviors, fall risk   Patient Stated Goals Patient reports he would like to be as independent as possible, he wants to get back to going to the gym and walking on the treadmill and biking.  He wants his legs to be stronger, his balance better and safe with walking.  He would love to play golf again.     Currently in Pain? No/denies   Multiple Pain Sites No                      OT Treatments/Exercises (OP) - 07/19/14 0001    Neurological Re-education Exercises   Other Exercises 1 LSVT Daily Session Maximal Daily Exercises: Sustained movements are designed to rescale the amplitude of  movement output for generalization to daily functional activities. Performed as follows for 1 set of 10 repetitions each: Multi directional sustained movements- 1) Floor to ceiling, 2) Side to side. Multi directional Repetitive movements performed in standing and are designed to provide retraining effort needed for sustained muscle activation in tasks Performed as follows: 3) Step and reach forward, 4) Step and Reach Backwards, 5) Step and reach sideways, 6) Rock and reach forward/backward, 7) Rock and reach sideways. Sit to stand from mat table on lowest setting with cues for weight shift, technique and CGA for 5 reps for 2 sets. Patient seen for functional mobility tasks this date with emphasis on gait speed, length of steps with short distance ambulation, 150 feet for 4 trials without assistive device and CGA. Patient requires cues for BIG movements and cadence.                 OT Education - 07/18/14 1659    Education provided Yes   Education Details maximal  daily exercises, functional mobility   Person(s) Educated Patient   Methods Explanation;Demonstration;Tactile cues;Verbal cues   Comprehension Verbalized understanding;Returned demonstration;Verbal cues required;Tactile cues required             OT Long Term Goals - 07/17/14 1510    OT LONG TERM GOAL #1   Title Patient will decrease frequency of freezing episodes with score of 11 or less on Freezing of Gait Questionnaire in 4 weeks.   Baseline significant freezing behaviors, score of 15 on Freezing of Gait Questionairre   Time 4   Period Weeks   Status New   OT LONG TERM GOAL #2   Title Patient will improve gait speed and endurance and be able to walk 1500 feet in 6 minutes to negotiate around the home and community safely in 4 weeks.   Baseline Patient now limits his community activities, avoids crowds if possible. Ambulating 1340 feet.    Time 4   Period Weeks   Status New   OT LONG TERM GOAL #3   Title Patient will complete HEP for maximal daily exercises with modified independence in 4 weeks.   Baseline is performing some of the exercises at home but not with correct form, speed or accuracy.   Time 4   Period Weeks   Status New   OT LONG TERM GOAL #4   Title Patient will transfer from sit to stand without the use of arms safely and independently from a variety of chairs/surfaces in 4 weeks.    Baseline tends to use arms and has difficulty with lower surface heights.    Time 4   Period Weeks   Status New   OT LONG TERM GOAL #5   Title Patient will demonstrate the ability to navigate through a crowd in a restaurant with minimal freezing of gait and no falls with cues if needed.   Baseline significant freezing behaviors now in restaurants and crowds   Time 4   Period Weeks   Status New               Plan - 07/18/14 1702    Clinical Impression Statement Patient did well this date despite being seen in the afternoon.  He was able to ambulate without his assistive  device with no loss of balance even on sloping surfaces.  He did not have freezing behaviors this date as he did on the day of the evaluation.  He is able to respond well to cues, both verbal  and tactie.    Pt will benefit from skilled therapeutic intervention in order to improve on the following deficits (Retired) Abnormal gait;Decreased strength;Decreased knowledge of use of DME;Difficulty walking;Decreased mobility;Impaired UE functional use;Decreased balance;Decreased coordination;Decreased endurance   Rehab Potential Good   Clinical Impairments Affecting Rehab Potential freezing of gait   OT Frequency 4x / week   OT Duration 4 weeks   OT Treatment/Interventions Self-care/ADL training;Gait Training;Functional Mobility Training;Therapeutic exercises;Neuromuscular education;Patient/family education;DME and/or AE instruction;Balance training   OT Home Exercise Plan Patient will be required to perform daily homework/exercise tasks as a part of the program.        Problem List Patient Active Problem List   Diagnosis Date Noted  . Skin erythema 06/05/2013  . Aortic stenosis 05/08/2013  . CAD S/P percutaneous coronary angioplasty 05/03/2013  . HTN (hypertension) 05/03/2013  . Type 2 diabetes mellitus with vascular disease 05/03/2013  . HLD (hyperlipidemia) 05/03/2013  . Parkinson disease 05/03/2013  . Chronic headache 05/03/2013  . Abnormality of gait 03/21/2012  . Paralysis agitans 03/21/2012   Achilles Dunk, OTR/L, CLT Andres Escandon 07/19/2014, 5:05 PM  [place] MAIN [facility] SERVICES [address] [place], [place], [address] Phone: [phone]   Fax:  [phone]

## 2014-07-20 ENCOUNTER — Ambulatory Visit: Payer: Medicare Other | Admitting: Occupational Therapy

## 2014-07-20 ENCOUNTER — Encounter: Payer: Self-pay | Admitting: Occupational Therapy

## 2014-07-20 DIAGNOSIS — R279 Unspecified lack of coordination: Secondary | ICD-10-CM

## 2014-07-20 DIAGNOSIS — R296 Repeated falls: Secondary | ICD-10-CM

## 2014-07-20 DIAGNOSIS — G20 Parkinson's disease: Secondary | ICD-10-CM | POA: Diagnosis not present

## 2014-07-20 DIAGNOSIS — R262 Difficulty in walking, not elsewhere classified: Secondary | ICD-10-CM

## 2014-07-20 DIAGNOSIS — M6281 Muscle weakness (generalized): Secondary | ICD-10-CM

## 2014-07-20 NOTE — Therapy (Signed)
[place] MAIN [facility] SERVICES [address] [place], [place], [address] Phone: [phone]   Fax:  [phone]  Occupational Therapy Treatment  Patient Details  Name: Craig Dennis. MRN: [id number] Date of Birth: 1933/06/01 Referring Provider:  Idelle Crouch, MD  Encounter Date: 07/19/2014      OT End of Session - 07/19/14 1441    Visit Number 4   Number of Visits 17   Date for OT Re-Evaluation 08/20/14   Authorization Type medicare G code 4   Authorization Time Period 10   OT Start Time 1400   OT Stop Time 1455   OT Time Calculation (min) 55 min   Activity Tolerance Patient tolerated treatment well   Behavior During Therapy [facility] for tasks assessed/performed      Past Medical History  Diagnosis Date  . Parkinson's disease     Primarily right sided features  . Dyslipidemia   . History of chest pain   . Diabetes   . History of headache     chronic, recurrent, postconcussive, after fall from a collapsed deck, had been treated wtih prn fiorinal  . Hypertension   . Erectile dysfunction   . Colon polyps   . CAD (coronary artery disease)   . Plantar fasciitis   . Gait disorder   . Myocardial infarction   . Heart murmur   . Parkinson's disease   . BPH (benign prostatic hyperplasia)   . HOH (hard of hearing)   . Cancer     skin    Past Surgical History  Procedure Laterality Date  . Cardicac stent    . Hemorrhoid surgery    . Tonsillectomy and adenoidectomy    . Coronary angioplasty      stent  . Cataract extraction w/phaco Right 06/28/2014    Procedure: CATARACT EXTRACTION PHACO AND INTRAOCULAR LENS PLACEMENT (IOC);  Surgeon: Lyla Glassing, MD;  Location: [facility] ORS;  Service: Ophthalmology;  Laterality: Right;  [place]    1:07.4               AP     12.6             CDE   8.50    There were no vitals filed for this visit.  Visit Diagnosis:  Difficulty walking  Muscle weakness (generalized)  Lack of  coordination  Falls frequently      Subjective Assessment - 07/19/14 1438    Subjective  Patient reports he is less sore today and is feeling good.  Reports he has been busy all day with appointments and is a little fatigued but willing to work.  Wife reports patient has done extremely well with transfers and walking the past couple of days.  She feels he has made a quick improvement from therapy this week.    Patient is accompained by: Family member   Pertinent History Patient has had Parkinson's for many years and has been seen in the past for LSVT BIG and had good success with the program.  Patient has had a recent decline in function with changes as his disease progresses.  He has had up to 8 reported falls in the last 6-12 months.  He tends to fall forwards and often to his knees.  He reports his mobility is the worst when he is confronted by a crowd of people going into and coming out of restaurants and tends to freeze, go onto his toes and has the tendency to fall forwards.  He has had 2 episodes of blacking out in the past year, once while eating hot dogs at a local restaurant and then another time while at a cookout eating a hamburger.  He has recently converted to using a rollator for his functional mobility but does not feel comfortable yet with using. His wife reports he tends to push the rollator forwards too far and freezes and ends up falling forwards.    Limitations disease progression, freezing of gait behaviors, fall risk   Patient Stated Goals Patient reports he would like to be as independent as possible, he wants to get back to going to the gym and walking on the treadmill and biking.  He wants his legs to be stronger, his balance better and safe with walking.  He would love to play golf again.     Currently in Pain? No/denies   Multiple Pain Sites No                      OT Treatments/Exercises (OP) - 07/19/14 1449    Neurological Re-education Exercises   Other  Exercises 1 LSVT Daily Session Maximal Daily Exercises: Sustained movements are designed to rescale the amplitude of movement output for generalization to daily functional activities. Performed as follows for 1 set of 10 repetitions each: Multi directional sustained movements- 1) Floor to ceiling, 2) Side to side. Multi directional Repetitive movements performed in standing and are designed to provide retraining effort needed for sustained muscle activation in tasks Performed as follows: 3) Step and reach forward, 4) Step and Reach Backwards, 5) Step and reach sideways, 6) Rock and reach forward/backward, 7) Rock and reach sideways. Sit to stand from mat table on lowest setting with cues for weight shift, technique and CGA for 5 reps for 2 sets. Patient seen for functional mobility tasks this date with emphasis on gait speed, length of steps with short distance ambulation, 200 feet for 4 trials with assistive device. Patient requires cues for BIG movements and cadence.  Patient able to perform functional mobility without assistive device this date safely and with challenge.  therapist providing CGA to SBA for all acts.                 OT Education - 07/19/14 1441    Education provided Yes   Education Details Educated on maximal daily exercises and the need to perform another set of exercises tonight, for a total of 2 sets this date and going forward each day.   Person(s) Educated Patient;Spouse   Methods Explanation;Demonstration;Tactile cues;Verbal cues   Comprehension Verbal cues required;Returned demonstration;Verbalized understanding;Tactile cues required             OT Long Term Goals - 07/17/14 1510    OT LONG TERM GOAL #1   Title Patient will decrease frequency of freezing episodes with score of 11 or less on Freezing of Gait Questionnaire in 4 weeks.   Baseline significant freezing behaviors, score of 15 on Freezing of Gait Questionairre   Time 4   Period Weeks   Status New    OT LONG TERM GOAL #2   Title Patient will improve gait speed and endurance and be able to walk 1500 feet in 6 minutes to negotiate around the home and community safely in 4 weeks.   Baseline Patient now limits his community activities, avoids crowds if possible. Ambulating 1340 feet.    Time 4   Period Weeks   Status New   OT LONG  TERM GOAL #3   Title Patient will complete HEP for maximal daily exercises with modified independence in 4 weeks.   Baseline is performing some of the exercises at home but not with correct form, speed or accuracy.   Time 4   Period Weeks   Status New   OT LONG TERM GOAL #4   Title Patient will transfer from sit to stand without the use of arms safely and independently from a variety of chairs/surfaces in 4 weeks.    Baseline tends to use arms and has difficulty with lower surface heights.    Time 4   Period Weeks   Status New   OT LONG TERM GOAL #5   Title Patient will demonstrate the ability to navigate through a crowd in a restaurant with minimal freezing of gait and no falls with cues if needed.   Baseline significant freezing behaviors now in restaurants and crowds   Time 4   Period Weeks   Status New               Plan - 07/19/14 1500    Clinical Impression Statement Patient has made significant gains in his performance of maximal daily exercises this week and in functional mobility.  He has not used his rollator in therapy the last couple of days and has not needed it. Therapist has provided CGA to SBA for activities and patient has not had any loss of balance during therapy even with challenges of sloped terrain, winding pathways and uneven surfaces.  He will need to perform maximal daily exercise now 2x a day.  Will discuss and formulate functional component tasks by the end of the week.    Pt will benefit from skilled therapeutic intervention in order to improve on the following deficits (Retired) Abnormal gait;Decreased strength;Decreased  knowledge of use of DME;Difficulty walking;Decreased mobility;Impaired UE functional use;Decreased balance;Decreased coordination;Decreased endurance   Rehab Potential Good   Clinical Impairments Affecting Rehab Potential freezing of gait   OT Frequency 4x / week   OT Duration 4 weeks   OT Treatment/Interventions Self-care/ADL training;Gait Training;Functional Mobility Training;Therapeutic exercises;Neuromuscular education;Patient/family education;DME and/or AE instruction;Balance training   OT Home Exercise Plan Patient will be required to perform daily homework/exercise tasks as a part of the program.   Consulted and Agree with Plan of Care Patient;Family member/caregiver   Family Member Consulted wife        Problem List Patient Active Problem List   Diagnosis Date Noted  . Skin erythema 06/05/2013  . Aortic stenosis 05/08/2013  . CAD S/P percutaneous coronary angioplasty 05/03/2013  . HTN (hypertension) 05/03/2013  . Type 2 diabetes mellitus with vascular disease 05/03/2013  . HLD (hyperlipidemia) 05/03/2013  . Parkinson disease 05/03/2013  . Chronic headache 05/03/2013  . Abnormality of gait 03/21/2012  . Paralysis agitans 03/21/2012    Areona Homer 07/20/2014, 2:51 PM  [place] MAIN [facility] SERVICES [address] [place], [place], [address] Phone: [phone]   Fax:  [phone]

## 2014-07-20 NOTE — Therapy (Signed)
[place] MAIN [facility] SERVICES [address] [place], [place], [address] Phone: [phone]   Fax:  [phone]  Occupational Therapy Treatment  Patient Details  Name: Craig Dennis. MRN: [id number] Date of Birth: Sep 13, 1933 Referring Provider:  Idelle Crouch, MD  Encounter Date: 07/20/2014      OT End of Session - 07/20/14 1506    Visit Number 5   Number of Visits 17   Date for OT Re-Evaluation 08/20/14   Authorization Type medicare G code 5   Authorization Time Period 10   OT Start Time [phone]   OT Stop Time 1035   OT Time Calculation (min) 64 min   Equipment Utilized During Treatment rollator   Activity Tolerance Patient tolerated treatment well   Behavior During Therapy [facility] for tasks assessed/performed      Past Medical History  Diagnosis Date  . Parkinson's disease     Primarily right sided features  . Dyslipidemia   . History of chest pain   . Diabetes   . History of headache     chronic, recurrent, postconcussive, after fall from a collapsed deck, had been treated wtih prn fiorinal  . Hypertension   . Erectile dysfunction   . Colon polyps   . CAD (coronary artery disease)   . Plantar fasciitis   . Gait disorder   . Myocardial infarction   . Heart murmur   . Parkinson's disease   . BPH (benign prostatic hyperplasia)   . HOH (hard of hearing)   . Cancer     skin    Past Surgical History  Procedure Laterality Date  . Cardicac stent    . Hemorrhoid surgery    . Tonsillectomy and adenoidectomy    . Coronary angioplasty      stent  . Cataract extraction w/phaco Right 06/28/2014    Procedure: CATARACT EXTRACTION PHACO AND INTRAOCULAR LENS PLACEMENT (IOC);  Surgeon: Lyla Glassing, MD;  Location: [facility] ORS;  Service: Ophthalmology;  Laterality: Right;  [place]    1:07.4               AP     12.6             CDE   8.50    There were no vitals filed for this visit.  Visit Diagnosis:  Difficulty walking  Muscle  weakness (generalized)  Lack of coordination  Falls frequently      Subjective Assessment - 07/20/14 1501    Subjective  Patient reports he hasn't felt as well today and feels a little light headed during treatment.  He reports his balance feels off and not sure why.  Patient reports his head hurt earlier and he took something for it and it is not currently hurting.   Patient is accompained by: Family member   Pertinent History Patient has had Parkinson's for many years and has been seen in the past for LSVT BIG and had good success with the program.  Patient has had a recent decline in function with changes as his disease progresses.  He has had up to 8 reported falls in the last 6-12 months.  He tends to fall forwards and often to his knees.  He reports his mobility is the worst when he is confronted by a crowd of people going into and coming out of restaurants and tends to freeze, go onto his toes and has the tendency to fall forwards.  He has had 2 episodes of blacking  out in the past year, once while eating hot dogs at a local restaurant and then another time while at a cookout eating a hamburger.  He has recently converted to using a rollator for his functional mobility but does not feel comfortable yet with using. His wife reports he tends to push the rollator forwards too far and freezes and ends up falling forwards.    Limitations disease progression, freezing of gait behaviors, fall risk   Patient Stated Goals Patient reports he would like to be as independent as possible, he wants to get back to going to the gym and walking on the treadmill and biking.  He wants his legs to be stronger, his balance better and safe with walking.  He would love to play golf again.     Currently in Pain? No/denies   Multiple Pain Sites No                      OT Treatments/Exercises (OP) - 07/20/14 1503    Neurological Re-education Exercises   Other Exercises 1 LSVT Daily Session Maximal  Daily Exercises: Sustained movements are designed to rescale the amplitude of movement output for generalization to daily functional activities. Performed as follows for 1 set of 10 repetitions each: Multi directional sustained movements- 1) Floor to ceiling, 2) Side to side. Multi directional Repetitive movements performed in standing and are designed to provide retraining effort needed for sustained muscle activation in tasks Performed as follows: 3) Step and reach forward, 4) Step and Reach Backwards, 5) Step and reach sideways, 6) Rock and reach forward/backward, 7) Rock and reach sideways. Sit to stand from mat table on lowest setting with cues for weight shift, technique and CGA for 5 reps for 2 sets. Patient seen for functional mobility tasks this date with emphasis on gait speed, length of steps with short distance ambulation, 200 feet for 4 trials with assistive device. Patient requires cues for BIG movements and cadence  patient did have to utilize rollator this date with poor balance skills and increased freezing of gait compared to the last couple of sessions.                  OT Education - 07/20/14 1505    Education provided Yes   Education Details Patient did not perform exercises at home last date, stressed the importance of performing 2 times a day per LSVT protocol for maximal results of the program.     Person(s) Educated Patient;Spouse   Methods Demonstration;Explanation;Verbal cues;Tactile cues   Comprehension Verbal cues required;Returned demonstration;Verbalized understanding;Tactile cues required             OT Long Term Goals - 07/17/14 1510    OT LONG TERM GOAL #1   Title Patient will decrease frequency of freezing episodes with score of 11 or less on Freezing of Gait Questionnaire in 4 weeks.   Baseline significant freezing behaviors, score of 15 on Freezing of Gait Questionairre   Time 4   Period Weeks   Status New   OT LONG TERM GOAL #2   Title Patient  will improve gait speed and endurance and be able to walk 1500 feet in 6 minutes to negotiate around the home and community safely in 4 weeks.   Baseline Patient now limits his community activities, avoids crowds if possible. Ambulating 1340 feet.    Time 4   Period Weeks   Status New   OT LONG TERM GOAL #3  Title Patient will complete HEP for maximal daily exercises with modified independence in 4 weeks.   Baseline is performing some of the exercises at home but not with correct form, speed or accuracy.   Time 4   Period Weeks   Status New   OT LONG TERM GOAL #4   Title Patient will transfer from sit to stand without the use of arms safely and independently from a variety of chairs/surfaces in 4 weeks.    Baseline tends to use arms and has difficulty with lower surface heights.    Time 4   Period Weeks   Status New   OT LONG TERM GOAL #5   Title Patient will demonstrate the ability to navigate through a crowd in a restaurant with minimal freezing of gait and no falls with cues if needed.   Baseline significant freezing behaviors now in restaurants and crowds   Time 4   Period Weeks   Status New               Plan - 07/20/14 1507    Clinical Impression Statement Patient with decreased balance this date and increased freezing of gait when compared to the last couple of treatment sessions.  Performance this date was more indicative of his performance on Monday at his eval.  As a result he required the use of the rollator for functional mobility as well as CGA and cues from therapist for all acts.  Patient did report a headache earlier this am but no longer hurting.  The only other common theme was he was wearing the same shoes this date as he was at his evaluation.  His wife reports he has a heel lift in the tennis shoes he is wearing and therapist suspects this may be shifting his center of balance forwards and causing him to walk more on his toes and results in increased freezing  of gait.  We will need to monitor and see if this continues when he wears this particular pair of shoes.  Patient reports he took his medication on time this date.   He also felt somewhat light headed, therefore therapist provided increased assist and provided and encouraged increased rest breaks    Pt will benefit from skilled therapeutic intervention in order to improve on the following deficits (Retired) Abnormal gait;Decreased strength;Decreased knowledge of use of DME;Difficulty walking;Decreased mobility;Impaired UE functional use;Decreased balance;Decreased coordination;Decreased endurance   Rehab Potential Good   Clinical Impairments Affecting Rehab Potential freezing of gait   OT Frequency 4x / week   OT Duration 4 weeks   OT Treatment/Interventions Self-care/ADL training;Gait Training;Functional Mobility Training;Therapeutic exercises;Neuromuscular education;Patient/family education;DME and/or AE instruction;Balance training   OT Home Exercise Plan Patient will be required to perform daily homework/exercise tasks as a part of the program.   Consulted and Agree with Plan of Care Patient;Family member/caregiver   Family Member Consulted wife        Problem List Patient Active Problem List   Diagnosis Date Noted  . Skin erythema 06/05/2013  . Aortic stenosis 05/08/2013  . CAD S/P percutaneous coronary angioplasty 05/03/2013  . HTN (hypertension) 05/03/2013  . Type 2 diabetes mellitus with vascular disease 05/03/2013  . HLD (hyperlipidemia) 05/03/2013  . Parkinson disease 05/03/2013  . Chronic headache 05/03/2013  . Abnormality of gait 03/21/2012  . Paralysis agitans 03/21/2012  Amy T Tomasita Morrow, OTR/L, CLT  Lovett,Amy 07/20/2014, 3:13 PM  [place] MAIN [facility] SERVICES [address] [place], [place], [address]  Phone: [phone]   Fax:  [phone]

## 2014-07-23 ENCOUNTER — Ambulatory Visit: Payer: Medicare Other | Admitting: Occupational Therapy

## 2014-07-23 ENCOUNTER — Encounter: Payer: Self-pay | Admitting: Occupational Therapy

## 2014-07-23 DIAGNOSIS — R296 Repeated falls: Secondary | ICD-10-CM

## 2014-07-23 DIAGNOSIS — R262 Difficulty in walking, not elsewhere classified: Secondary | ICD-10-CM

## 2014-07-23 DIAGNOSIS — R279 Unspecified lack of coordination: Secondary | ICD-10-CM

## 2014-07-23 DIAGNOSIS — G20 Parkinson's disease: Secondary | ICD-10-CM | POA: Diagnosis not present

## 2014-07-23 DIAGNOSIS — M6281 Muscle weakness (generalized): Secondary | ICD-10-CM

## 2014-07-24 ENCOUNTER — Ambulatory Visit: Payer: Medicare Other | Admitting: Occupational Therapy

## 2014-07-24 ENCOUNTER — Encounter: Payer: Self-pay | Admitting: Occupational Therapy

## 2014-07-24 DIAGNOSIS — R262 Difficulty in walking, not elsewhere classified: Secondary | ICD-10-CM

## 2014-07-24 DIAGNOSIS — G20 Parkinson's disease: Secondary | ICD-10-CM | POA: Diagnosis not present

## 2014-07-24 DIAGNOSIS — M6281 Muscle weakness (generalized): Secondary | ICD-10-CM

## 2014-07-24 DIAGNOSIS — R279 Unspecified lack of coordination: Secondary | ICD-10-CM

## 2014-07-24 DIAGNOSIS — R296 Repeated falls: Secondary | ICD-10-CM

## 2014-07-24 NOTE — Therapy (Signed)
[place] MAIN [facility] SERVICES [address] [place], [place], [address] Phone: [phone]   Fax:  [phone]  Occupational Therapy Treatment  Patient Details  Name: Craig Dennis. MRN: [id number] Date of Birth: July 13, 1933 Referring Provider:  Vladimir Crofts, MD  Encounter Date: 07/23/2014      OT End of Session - 07/23/14 1624    Visit Number 6   Number of Visits 17   Date for OT Re-Evaluation 08/20/14   Authorization Type medicare G code 6   Authorization Time Period 10   OT Start Time 0900   OT Stop Time 0959   OT Time Calculation (min) 59 min   Activity Tolerance Patient tolerated treatment well   Behavior During Therapy [facility] for tasks assessed/performed      Past Medical History  Diagnosis Date  . Parkinson's disease     Primarily right sided features  . Dyslipidemia   . History of chest pain   . Diabetes   . History of headache     chronic, recurrent, postconcussive, after fall from a collapsed deck, had been treated wtih prn fiorinal  . Hypertension   . Erectile dysfunction   . Colon polyps   . CAD (coronary artery disease)   . Plantar fasciitis   . Gait disorder   . Myocardial infarction   . Heart murmur   . Parkinson's disease   . BPH (benign prostatic hyperplasia)   . HOH (hard of hearing)   . Cancer     skin    Past Surgical History  Procedure Laterality Date  . Cardicac stent    . Hemorrhoid surgery    . Tonsillectomy and adenoidectomy    . Coronary angioplasty      stent  . Cataract extraction w/phaco Right 06/28/2014    Procedure: CATARACT EXTRACTION PHACO AND INTRAOCULAR LENS PLACEMENT (IOC);  Surgeon: Lyla Glassing, MD;  Location: [facility] ORS;  Service: Ophthalmology;  Laterality: Right;  [place]    1:07.4               AP     12.6             CDE   8.50    There were no vitals filed for this visit.  Visit Diagnosis:  Difficulty walking  Muscle weakness (generalized)  Lack of coordination  Falls  frequently      Subjective Assessment - 07/23/14 1621    Subjective  Patient reports he has a busy day today, has to go to the eye doctor after therapy, has a funeral to go to and a lunch.  Has on dress shoes this date with a slight wedge at the back.   Patient is accompained by: Family member   Pertinent History Patient has had Parkinson's for many years and has been seen in the past for LSVT BIG and had good success with the program.  Patient has had a recent decline in function with changes as his disease progresses.  He has had up to 8 reported falls in the last 6-12 months.  He tends to fall forwards and often to his knees.  He reports his mobility is the worst when he is confronted by a crowd of people going into and coming out of restaurants and tends to freeze, go onto his toes and has the tendency to fall forwards.  He has had 2 episodes of blacking out in the past year, once while eating hot dogs at a local  restaurant and then another time while at a cookout eating a hamburger.  He has recently converted to using a rollator for his functional mobility but does not feel comfortable yet with using. His wife reports he tends to push the rollator forwards too far and freezes and ends up falling forwards.    Limitations disease progression, freezing of gait behaviors, fall risk   Patient Stated Goals Patient reports he would like to be as independent as possible, he wants to get back to going to the gym and walking on the treadmill and biking.  He wants his legs to be stronger, his balance better and safe with walking.  He would love to play golf again.     Currently in Pain? No/denies   Multiple Pain Sites No                      OT Treatments/Exercises (OP) - 07/23/14 1623    ADLs   Overall ADLs Patient seen for functional component tasks of sit to stand, crossing right and left legs to prep for tying shoes, stairs, curb negotiation and turning in small spaces for 5 reps each  activity, CGA as needed for turning behaviors and cues for moving BIG.   Neurological Re-education Exercises   Other Exercises 1 LSVT Daily Session Maximal Daily Exercises: Sustained movements are designed to rescale the amplitude of movement output for generalization to daily functional activities. Performed as follows for 1 set of 10 repetitions each: Multi directional sustained movements- 1) Floor to ceiling, 2) Side to side. Multi directional Repetitive movements performed in standing and are designed to provide retraining effort needed for sustained muscle activation in tasks Performed as follows: 3) Step and reach forward, 4) Step and Reach Backwards, 5) Step and reach sideways, 6) Rock and reach forward/backward, 7) Rock and reach sideways. Sit to stand from mat table on lowest setting with cues for weight shift, technique and CGA for 5 reps for 2 sets. Patient seen for functional mobility tasks this date with emphasis on gait speed, length of steps with short distance ambulation, 200 feet for 4 trials with assistive device. Patient requires cues for BIG movements and cadence                OT Education - 07/23/14 1430    Education provided Yes   Education Details Patient instructed on methods for maximal daily exercises, perform one more set of exercises this evening.     Person(s) Educated Patient;Spouse   Methods Explanation;Demonstration;Verbal cues   Comprehension Verbal cues required;Returned demonstration;Verbalized understanding             OT Long Term Goals - 07/17/14 1510    OT LONG TERM GOAL #1   Title Patient will decrease frequency of freezing episodes with score of 11 or less on Freezing of Gait Questionnaire in 4 weeks.   Baseline significant freezing behaviors, score of 15 on Freezing of Gait Questionairre   Time 4   Period Weeks   Status New   OT LONG TERM GOAL #2   Title Patient will improve gait speed and endurance and be able to walk 1500 feet in 6  minutes to negotiate around the home and community safely in 4 weeks.   Baseline Patient now limits his community activities, avoids crowds if possible. Ambulating 1340 feet.    Time 4   Period Weeks   Status New   OT LONG TERM GOAL #3   Title Patient will  complete HEP for maximal daily exercises with modified independence in 4 weeks.   Baseline is performing some of the exercises at home but not with correct form, speed or accuracy.   Time 4   Period Weeks   Status New   OT LONG TERM GOAL #4   Title Patient will transfer from sit to stand without the use of arms safely and independently from a variety of chairs/surfaces in 4 weeks.    Baseline tends to use arms and has difficulty with lower surface heights.    Time 4   Period Weeks   Status New   OT LONG TERM GOAL #5   Title Patient will demonstrate the ability to navigate through a crowd in a restaurant with minimal freezing of gait and no falls with cues if needed.   Baseline significant freezing behaviors now in restaurants and crowds   Time 4   Period Weeks   Status New               Plan - 07/23/14 1624    Clinical Impression Statement   Patient's balance and freezing of gait improved this date from last session.  He was able to perform tasks this date without the use of the rollator in and around the clinic.  CGA provided by therapist during tasks in standing for safety.  No loss of balance this date but did tend to lean forwards too far when performing rock and reach exercise which also results in some scissoring of the legs. Patient did not demonstrate the pattern of walking on his toes this date like he did yesterday.  He did wear a different pair of shoes for walking today.   Pt will benefit from skilled therapeutic intervention in order to improve on the following deficits (Retired) Abnormal gait;Decreased strength;Decreased knowledge of use of DME;Difficulty walking;Decreased mobility;Impaired UE functional  use;Decreased balance;Decreased coordination;Decreased endurance   Rehab Potential Good   Clinical Impairments Affecting Rehab Potential freezing of gait   OT Frequency 4x / week   OT Duration 4 weeks   OT Treatment/Interventions Self-care/ADL training;Gait Training;Functional Mobility Training;Therapeutic exercises;Neuromuscular education;Patient/family education;DME and/or AE instruction;Balance training   Plan Continue to work towards amplitude of gait, maximal daily exercises and functional component tasks.   OT Home Exercise Plan Patient will be required to perform daily homework/exercise tasks as a part of the program.   Consulted and Agree with Plan of Care Patient;Family member/caregiver   Family Member Consulted wife        Problem List Patient Active Problem List   Diagnosis Date Noted  . Skin erythema 06/05/2013  . Aortic stenosis 05/08/2013  . CAD S/P percutaneous coronary angioplasty 05/03/2013  . HTN (hypertension) 05/03/2013  . Type 2 diabetes mellitus with vascular disease 05/03/2013  . HLD (hyperlipidemia) 05/03/2013  . Parkinson disease 05/03/2013  . Chronic headache 05/03/2013  . Abnormality of gait 03/21/2012  . Paralysis agitans 03/21/2012   Achilles Dunk, OTR/L, CLT Karas Pickerill 07/24/2014, 2:37 PM  [place] MAIN [facility] SERVICES [address] [place], [place], [address] Phone: [phone]   Fax:  [phone]

## 2014-07-25 ENCOUNTER — Encounter: Payer: Self-pay | Admitting: Occupational Therapy

## 2014-07-25 ENCOUNTER — Ambulatory Visit: Payer: Medicare Other | Admitting: Occupational Therapy

## 2014-07-25 DIAGNOSIS — R279 Unspecified lack of coordination: Secondary | ICD-10-CM

## 2014-07-25 DIAGNOSIS — R262 Difficulty in walking, not elsewhere classified: Secondary | ICD-10-CM

## 2014-07-25 DIAGNOSIS — G20 Parkinson's disease: Secondary | ICD-10-CM | POA: Diagnosis not present

## 2014-07-25 DIAGNOSIS — M6281 Muscle weakness (generalized): Secondary | ICD-10-CM

## 2014-07-25 DIAGNOSIS — R296 Repeated falls: Secondary | ICD-10-CM

## 2014-07-25 NOTE — Therapy (Signed)
[place] MAIN [facility] SERVICES [address] [place], [place], [address] Phone: [phone]   Fax:  [phone]  Occupational Therapy Treatment  Patient Details  Name: Craig Dennis. MRN: [id number] Date of Birth: 04-13-1933 Referring Provider:  Vladimir Crofts, MD  Encounter Date: 07/24/2014      OT End of Session - 07/24/14 1700    Visit Number 7   Number of Visits 17   Date for OT Re-Evaluation 08/20/14   Authorization Type medicare G code 7   Authorization Time Period 10   OT Start Time [id number]   OT Stop Time 1015   OT Time Calculation (min) 59 min   Activity Tolerance Patient tolerated treatment well   Behavior During Therapy [facility] for tasks assessed/performed      Past Medical History  Diagnosis Date  . Parkinson's disease     Primarily right sided features  . Dyslipidemia   . History of chest pain   . Diabetes   . History of headache     chronic, recurrent, postconcussive, after fall from a collapsed deck, had been treated wtih prn fiorinal  . Hypertension   . Erectile dysfunction   . Colon polyps   . CAD (coronary artery disease)   . Plantar fasciitis   . Gait disorder   . Myocardial infarction   . Heart murmur   . Parkinson's disease   . BPH (benign prostatic hyperplasia)   . HOH (hard of hearing)   . Cancer     skin    Past Surgical History  Procedure Laterality Date  . Cardicac stent    . Hemorrhoid surgery    . Tonsillectomy and adenoidectomy    . Coronary angioplasty      stent  . Cataract extraction w/phaco Right 06/28/2014    Procedure: CATARACT EXTRACTION PHACO AND INTRAOCULAR LENS PLACEMENT (IOC);  Surgeon: Lyla Glassing, MD;  Location: [facility] ORS;  Service: Ophthalmology;  Laterality: Right;  [place]    1:07.4               AP     12.6             CDE   8.50    There were no vitals filed for this visit.  Visit Diagnosis:  Difficulty walking  Muscle weakness (generalized)  Lack of coordination  Falls  frequently      Subjective Assessment - 07/24/14 1657    Subjective  Patient reports they had a busy day yesterday and didn't get home until almost 3, still somewhat tired today.  "I am wearing another set of tennis shoes to try today."  Patient did report a fall over the weekend.  Reports he went into the laundry room to turn off a light and tripped over something, reports he did not get hurt but has a small abrasion on his left knee.   Patient is accompained by: Family member   Limitations disease progression, freezing of gait behaviors, fall risk   Patient Stated Goals Patient reports he would like to be as independent as possible, he wants to get back to going to the gym and walking on the treadmill and biking.  He wants his legs to be stronger, his balance better and safe with walking.  He would love to play golf again.     Currently in Pain? No/denies   Multiple Pain Sites No  OT Treatments/Exercises (OP) - 07/24/14 1658    ADLs   Overall ADLs Patient seen for functional component tasks of sit to stand, crossing right and left legs to prep for tying shoes, stairs, curb negotiation and turning in small spaces for 5 reps each activity, CGA as needed for turning behaviors and cues for moving BIG.   Neurological Re-education Exercises   Other Exercises 1 LSVT Daily Session Maximal Daily Exercises: Sustained movements are designed to rescale the amplitude of movement output for generalization to daily functional activities. Performed as follows for 1 set of 10 repetitions each: Multi directional sustained movements- 1) Floor to ceiling, 2) Side to side. Multi directional Repetitive movements performed in standing and are designed to provide retraining effort needed for sustained muscle activation in tasks Performed as follows: 3) Step and reach forward, 4) Step and Reach Backwards, 5) Step and reach sideways, 6) Rock and reach forward/backward, 7) Rock and reach  sideways. Sit to stand from mat table on lowest setting with cues for weight shift, technique and CGA for 5 reps for 2 sets. Patient seen for functional mobility tasks this date with emphasis on gait speed, length of steps with short distance ambulation, 200 feet for 4 trials with assistive device. Patient requires cues for BIG movements and cadence.                OT Education - 07/24/14 1659    Education provided Yes   Education Details Turning in small spaces, compensatory strategies when presented with freezing behaviors.   Person(s) Educated Patient;Spouse   Methods Explanation;Demonstration;Verbal cues   Comprehension Verbalized understanding;Returned demonstration;Verbal cues required             OT Long Term Goals - 07/17/14 1510    OT LONG TERM GOAL #1   Title Patient will decrease frequency of freezing episodes with score of 11 or less on Freezing of Gait Questionnaire in 4 weeks.   Baseline significant freezing behaviors, score of 15 on Freezing of Gait Questionairre   Time 4   Period Weeks   Status New   OT LONG TERM GOAL #2   Title Patient will improve gait speed and endurance and be able to walk 1500 feet in 6 minutes to negotiate around the home and community safely in 4 weeks.   Baseline Patient now limits his community activities, avoids crowds if possible. Ambulating 1340 feet.    Time 4   Period Weeks   Status New   OT LONG TERM GOAL #3   Title Patient will complete HEP for maximal daily exercises with modified independence in 4 weeks.   Baseline is performing some of the exercises at home but not with correct form, speed or accuracy.   Time 4   Period Weeks   Status New   OT LONG TERM GOAL #4   Title Patient will transfer from sit to stand without the use of arms safely and independently from a variety of chairs/surfaces in 4 weeks.    Baseline tends to use arms and has difficulty with lower surface heights.    Time 4   Period Weeks   Status New    OT LONG TERM GOAL #5   Title Patient will demonstrate the ability to navigate through a crowd in a restaurant with minimal freezing of gait and no falls with cues if needed.   Baseline significant freezing behaviors now in restaurants and crowds   Time 4   Period Weeks   Status New  Plan - 07/24/14 1700    Clinical Impression Statement Patient continues to require cues for pace of exercises, tends to go too fast which affects his performance and balance.  Tends to scissor feet at times with stepping forwards and with rock and reach exercise which causes him to lose his balance and requires assist for recovery. Cues for positioning of feet and technique to perfrom exercise correctly as well as CGA for balance for most acts in standing.  Did not use rollator this date for functional mobility tasks, cues for amplitude of gait and speed.  He still demos difficulty with turning, managing in small spaces and crowds.    Pt will benefit from skilled therapeutic intervention in order to improve on the following deficits (Retired) Abnormal gait;Decreased strength;Decreased knowledge of use of DME;Difficulty walking;Decreased mobility;Impaired UE functional use;Decreased balance;Decreased coordination;Decreased endurance   Rehab Potential Good   Clinical Impairments Affecting Rehab Potential freezing of gait   OT Frequency 4x / week   OT Duration 4 weeks   OT Treatment/Interventions Self-care/ADL training;Gait Training;Functional Mobility Training;Therapeutic exercises;Neuromuscular education;Patient/family education;DME and/or AE instruction;Balance training   OT Home Exercise Plan Patient will be required to perform daily homework/exercise tasks as a part of the program.   Consulted and Agree with Plan of Care Patient;Family member/caregiver   Family Member Consulted wife        Problem List Patient Active Problem List   Diagnosis Date Noted  . Skin erythema 06/05/2013  .  Aortic stenosis 05/08/2013  . CAD S/P percutaneous coronary angioplasty 05/03/2013  . HTN (hypertension) 05/03/2013  . Type 2 diabetes mellitus with vascular disease 05/03/2013  . HLD (hyperlipidemia) 05/03/2013  . Parkinson disease 05/03/2013  . Chronic headache 05/03/2013  . Abnormality of gait 03/21/2012  . Paralysis agitans 03/21/2012   Achilles Dunk, OTR/L, CLT Kori Goins 07/25/2014, 10:42 AM  [place] MAIN [facility] SERVICES [address] [place], [place], [address] Phone: [phone]   Fax:  [phone]

## 2014-07-26 ENCOUNTER — Ambulatory Visit: Payer: Medicare Other | Admitting: Occupational Therapy

## 2014-07-26 NOTE — Therapy (Signed)
[place] MAIN [facility] SERVICES [address] [place], [place], [address] Phone: [phone]   Fax:  [phone]  Occupational Therapy Treatment  Patient Details  Name: Craig Dennis. MRN: [id number] Date of Birth: 04-18-1933 Referring Provider:  Vladimir Crofts, MD  Encounter Date: 07/25/2014      OT End of Session - 07/25/14 1634    Visit Number 8   Number of Visits 17   Date for OT Re-Evaluation 08/20/14   Authorization Type medicare G code 8   Authorization Time Period 10   OT Start Time 0900   OT Stop Time 1010   OT Time Calculation (min) 70 min   Equipment Utilized During Treatment cane   Activity Tolerance Patient tolerated treatment well   Behavior During Therapy [facility] for tasks assessed/performed      Past Medical History  Diagnosis Date  . Parkinson's disease     Primarily right sided features  . Dyslipidemia   . History of chest pain   . Diabetes   . History of headache     chronic, recurrent, postconcussive, after fall from a collapsed deck, had been treated wtih prn fiorinal  . Hypertension   . Erectile dysfunction   . Colon polyps   . CAD (coronary artery disease)   . Plantar fasciitis   . Gait disorder   . Myocardial infarction   . Heart murmur   . Parkinson's disease   . BPH (benign prostatic hyperplasia)   . HOH (hard of hearing)   . Cancer     skin    Past Surgical History  Procedure Laterality Date  . Cardicac stent    . Hemorrhoid surgery    . Tonsillectomy and adenoidectomy    . Coronary angioplasty      stent  . Cataract extraction w/phaco Right 06/28/2014    Procedure: CATARACT EXTRACTION PHACO AND INTRAOCULAR LENS PLACEMENT (IOC);  Surgeon: Lyla Glassing, MD;  Location: [facility] ORS;  Service: Ophthalmology;  Laterality: Right;  [place]    1:07.4               AP     12.6             CDE   8.50    There were no vitals filed for this visit.  Visit Diagnosis:  Difficulty walking  Muscle weakness  (generalized)  Lack of coordination  Falls frequently      Subjective Assessment - 07/25/14 1633    Subjective  Patient reports he is doing well, decided to bring the cane today instead of the rollator.  Reports he and his wife have been busy all week with lots of appointments and is looking for a "day off".  He is aware he will still need to perform exercises 2x a day on the days he does not come to therapy.  "That was a good idea to have me work on crossing my leg over to reach my foot, I need to be able to do that."   Patient is accompained by: Family member   Limitations disease progression, freezing of gait behaviors, fall risk   Special Tests 6 minute walk test, 5 times sit to stand, BERG balance test   Patient Stated Goals Patient reports he would like to be as independent as possible, he wants to get back to going to the gym and walking on the treadmill and biking.  He wants his legs to be stronger, his balance better and safe  with walking.  He would love to play golf again.     Currently in Pain? No/denies   Multiple Pain Sites No                      OT Treatments/Exercises (OP) - 07/25/14 1633    ADLs   Overall ADLs Patient seen for functional component tasks of sit to stand, crossing right and left legs to prep for tying shoes, stairs, curb negotiation and turning in small spaces for 5 reps each activity, CGA as needed for turning behaviors and cues for moving BIG   Neurological Re-education Exercises   Other Exercises 1 LSVT Daily Session Maximal Daily Exercises: Sustained movements are designed to rescale the amplitude of movement output for generalization to daily functional activities. Performed as follows for 1 set of 10 repetitions each: Multi directional sustained movements- 1) Floor to ceiling, 2) Side to side. Multi directional Repetitive movements performed in standing and are designed to provide retraining effort needed for sustained muscle activation in  tasks Performed as follows: 3) Step and reach forward, 4) Step and Reach Backwards, 5) Step and reach sideways, 6) Rock and reach forward/backward, 7) Rock and reach sideways. Sit to stand from mat table on lowest setting with cues for weight shift, technique and CGA for 5 reps for 2 sets. Patient seen for functional mobility tasks this date with emphasis on gait speed, length of steps with short distance ambulation, 200 feet for 4 trials without assistive device this date. Patient requires cues for BIG movements and cadence.                OT Education - 07/26/14 1425    Education provided --   Education Details --   Person(s) Educated --   Methods --   Comprehension --             OT Long Term Goals - 07/17/14 1510    OT LONG TERM GOAL #1   Title Patient will decrease frequency of freezing episodes with score of 11 or less on Freezing of Gait Questionnaire in 4 weeks.   Baseline significant freezing behaviors, score of 15 on Freezing of Gait Questionairre   Time 4   Period Weeks   Status New   OT LONG TERM GOAL #2   Title Patient will improve gait speed and endurance and be able to walk 1500 feet in 6 minutes to negotiate around the home and community safely in 4 weeks.   Baseline Patient now limits his community activities, avoids crowds if possible. Ambulating 1340 feet.    Time 4   Period Weeks   Status New   OT LONG TERM GOAL #3   Title Patient will complete HEP for maximal daily exercises with modified independence in 4 weeks.   Baseline is performing some of the exercises at home but not with correct form, speed or accuracy.   Time 4   Period Weeks   Status New   OT LONG TERM GOAL #4   Title Patient will transfer from sit to stand without the use of arms safely and independently from a variety of chairs/surfaces in 4 weeks.    Baseline tends to use arms and has difficulty with lower surface heights.    Time 4   Period Weeks   Status New   OT LONG TERM GOAL  #5   Title Patient will demonstrate the ability to navigate through a crowd in a restaurant with minimal freezing  of gait and no falls with cues if needed.   Baseline significant freezing behaviors now in restaurants and crowds   Time 4   Period Weeks   Status New               Plan - 07/25/14 1635    Clinical Impression Statement Patient's performance varies from day to day.  There have been days he did not require the rollator for functional mobility and other days he demonstrates significant freezing of gait behaviors.  He responds well to verbal and tactile cues in the clinic.  Today on the way to therapy he almost fell in the elevator when presented with a crowded space and marked threshold.  Will continue to work on these areas to diminish the response of freezing.  Will place increased emphasis on changes in flooring, thresholds and crowded spaces.  Incorporate distractions into functional mobility to also diminish these behaviors.   Pt will benefit from skilled therapeutic intervention in order to improve on the following deficits (Retired) Abnormal gait;Decreased strength;Decreased knowledge of use of DME;Difficulty walking;Decreased mobility;Impaired UE functional use;Decreased balance;Decreased coordination;Decreased endurance   Rehab Potential Good   Clinical Impairments Affecting Rehab Potential freezing of gait   OT Frequency 4x / week   OT Duration 4 weeks   OT Treatment/Interventions Self-care/ADL training;Gait Training;Functional Mobility Training;Therapeutic exercises;Neuromuscular education;Patient/family education;DME and/or AE instruction;Balance training   OT Home Exercise Plan Patient will be required to perform daily homework/exercise tasks as a part of the program.   Consulted and Agree with Plan of Care Patient;Family member/caregiver   Family Member Consulted wife        Problem List Patient Active Problem List   Diagnosis Date Noted  . Skin erythema  06/05/2013  . Aortic stenosis 05/08/2013  . CAD S/P percutaneous coronary angioplasty 05/03/2013  . HTN (hypertension) 05/03/2013  . Type 2 diabetes mellitus with vascular disease 05/03/2013  . HLD (hyperlipidemia) 05/03/2013  . Parkinson disease 05/03/2013  . Chronic headache 05/03/2013  . Abnormality of gait 03/21/2012  . Paralysis agitans 03/21/2012   Achilles Dunk, OTR/L, CLT Ande Therrell 07/26/2014, 2:31 PM  [place] [facility] MAIN [facility] SERVICES [address] [place], [place], [address] Phone: [phone]   Fax:  [phone]

## 2014-07-27 ENCOUNTER — Encounter: Payer: Medicare Other | Admitting: Occupational Therapy

## 2014-07-27 ENCOUNTER — Encounter: Payer: Self-pay | Admitting: Occupational Therapy

## 2014-07-27 ENCOUNTER — Ambulatory Visit: Payer: Medicare Other | Admitting: Occupational Therapy

## 2014-07-27 DIAGNOSIS — G20 Parkinson's disease: Secondary | ICD-10-CM | POA: Diagnosis not present

## 2014-07-27 DIAGNOSIS — R262 Difficulty in walking, not elsewhere classified: Secondary | ICD-10-CM

## 2014-07-27 DIAGNOSIS — R296 Repeated falls: Secondary | ICD-10-CM

## 2014-07-27 DIAGNOSIS — R279 Unspecified lack of coordination: Secondary | ICD-10-CM

## 2014-07-27 DIAGNOSIS — M6281 Muscle weakness (generalized): Secondary | ICD-10-CM

## 2014-07-27 NOTE — Therapy (Signed)
[place] MAIN [facility] SERVICES [address] [place], [place], [address] Phone: [phone]   Fax:  [phone]  Occupational Therapy Treatment  Patient Details  Name: Craig Dennis. MRN: [id number] Date of Birth: Jul 10, 1933 Referring Provider:  Vladimir Crofts, MD  Encounter Date: 07/27/2014      OT End of Session - 07/27/14 1550    Visit Number 9   Number of Visits 17   Date for OT Re-Evaluation 08/20/14   Authorization Type medicare G code 9   Authorization Time Period 10   OT Start Time 0859   OT Stop Time 0955   OT Time Calculation (min) 56 min   Activity Tolerance Patient tolerated treatment well   Behavior During Therapy [facility] for tasks assessed/performed      Past Medical History  Diagnosis Date  . Parkinson's disease     Primarily right sided features  . Dyslipidemia   . History of chest pain   . Diabetes   . History of headache     chronic, recurrent, postconcussive, after fall from a collapsed deck, had been treated wtih prn fiorinal  . Hypertension   . Erectile dysfunction   . Colon polyps   . CAD (coronary artery disease)   . Plantar fasciitis   . Gait disorder   . Myocardial infarction   . Heart murmur   . Parkinson's disease   . BPH (benign prostatic hyperplasia)   . HOH (hard of hearing)   . Cancer     skin    Past Surgical History  Procedure Laterality Date  . Cardicac stent    . Hemorrhoid surgery    . Tonsillectomy and adenoidectomy    . Coronary angioplasty      stent  . Cataract extraction w/phaco Right 06/28/2014    Procedure: CATARACT EXTRACTION PHACO AND INTRAOCULAR LENS PLACEMENT (IOC);  Surgeon: Lyla Glassing, MD;  Location: [facility] ORS;  Service: Ophthalmology;  Laterality: Right;  [place]    1:07.4               AP     12.6             CDE   8.50    There were no vitals filed for this visit.  Visit Diagnosis:  Difficulty walking  Muscle weakness (generalized)  Lack of coordination  Falls  frequently      Subjective Assessment - 07/27/14 1544    Subjective  Patient reports he felt he needed to bring his rollator today because his balance was off.  "I am hoping it will be better after we do our session.  We are going to [employer] today and I have to walk alot more."   Patient is accompained by: Family member   Limitations disease progression, freezing of gait behaviors, fall risk   Special Tests 6 minute walk test, 5 times sit to stand, BERG balance test   Patient Stated Goals Patient reports he would like to be as independent as possible, he wants to get back to going to the gym and walking on the treadmill and biking.  He wants his legs to be stronger, his balance better and safe with walking.  He would love to play golf again.     Multiple Pain Sites No                      OT Treatments/Exercises (OP) - 07/27/14 1547    ADLs   Overall  ADLs Patient seen for functional component tasks of sit to stand, crossing right and left legs to prep for tying shoes, stairs, curb negotiation and turning in small spaces for 5 reps each activity, CGA as needed for turning behaviors and cues for moving BIG  Patient progressing well with crossed legs to help with lower body dressing and is able to perform without pulling on his pant leg.   Neurological Re-education Exercises   Other Exercises 1 LSVT Daily Session Maximal Daily Exercises: Sustained movements are designed to rescale the amplitude of movement output for generalization to daily functional activities. Performed as follows for 1 set of 10 repetitions each: Multi directional sustained movements- 1) Floor to ceiling, 2) Side to side. Multi directional Repetitive movements performed in standing and are designed to provide retraining effort needed for sustained muscle activation in tasks Performed as follows: 3) Step and reach forward, 4) Step and Reach Backwards, 5) Step and reach sideways, 6) Rock and reach forward/backward, 7)  Rock and reach sideways. Sit to stand from mat table on lowest setting with cues for weight shift, technique and CGA for 5 reps for 2 sets. Patient seen for functional mobility tasks this date with emphasis on gait speed, length of steps with short distance ambulation, 300 feet for 4 trials without assistive device this date. Patient requires cues for BIG movements and cadence                OT Education - 07/27/14 1549    Education provided Yes   Education Details education on freezing behaviors and modification.   Person(s) Educated Patient;Spouse   Methods Explanation;Demonstration;Verbal cues   Comprehension Verbal cues required;Returned demonstration;Verbalized understanding             OT Long Term Goals - 07/17/14 1510    OT LONG TERM GOAL #1   Title Patient will decrease frequency of freezing episodes with score of 11 or less on Freezing of Gait Questionnaire in 4 weeks.   Baseline significant freezing behaviors, score of 15 on Freezing of Gait Questionairre   Time 4   Period Weeks   Status New   OT LONG TERM GOAL #2   Title Patient will improve gait speed and endurance and be able to walk 1500 feet in 6 minutes to negotiate around the home and community safely in 4 weeks.   Baseline Patient now limits his community activities, avoids crowds if possible. Ambulating 1340 feet.    Time 4   Period Weeks   Status New   OT LONG TERM GOAL #3   Title Patient will complete HEP for maximal daily exercises with modified independence in 4 weeks.   Baseline is performing some of the exercises at home but not with correct form, speed or accuracy.   Time 4   Period Weeks   Status New   OT LONG TERM GOAL #4   Title Patient will transfer from sit to stand without the use of arms safely and independently from a variety of chairs/surfaces in 4 weeks.    Baseline tends to use arms and has difficulty with lower surface heights.    Time 4   Period Weeks   Status New   OT LONG  TERM GOAL #5   Title Patient will demonstrate the ability to navigate through a crowd in a restaurant with minimal freezing of gait and no falls with cues if needed.   Baseline significant freezing behaviors now in restaurants and crowds   Time 4  Period Weeks   Status New               Plan - 07/27/14 1551    Clinical Impression Statement Patient continues to demonstrate freezing behaviors at times.  Today he required assistance to avoid a fall when he froze going towards a threshold of an automatic door that started to close.  Therapist was utilzing a gait belt and was able to help patient recover from the balance loss, patient tends to freeze and will go forwards onto his toes and keep leaning forwards.  Otherwise he performed well with functional mobiliity with sloped paths and uneven surfaces.  Will continue to work on freezing of gait in addition to LSVT BIG protocol. Will plan to reassess next session.   Pt will benefit from skilled therapeutic intervention in order to improve on the following deficits (Retired) Abnormal gait;Decreased strength;Decreased knowledge of use of DME;Difficulty walking;Decreased mobility;Impaired UE functional use;Decreased balance;Decreased coordination;Decreased endurance   Rehab Potential Good   Clinical Impairments Affecting Rehab Potential freezing of gait   OT Duration 4 weeks   OT Treatment/Interventions Self-care/ADL training;Gait Training;Functional Mobility Training;Therapeutic exercises;Neuromuscular education;Patient/family education;DME and/or AE instruction;Balance training   OT Home Exercise Plan Patient will be required to perform daily homework/exercise tasks as a part of the program.   Consulted and Agree with Plan of Care Patient;Family member/caregiver        Problem List Patient Active Problem List   Diagnosis Date Noted  . Skin erythema 06/05/2013  . Aortic stenosis 05/08/2013  . CAD S/P percutaneous coronary angioplasty  05/03/2013  . HTN (hypertension) 05/03/2013  . Type 2 diabetes mellitus with vascular disease 05/03/2013  . HLD (hyperlipidemia) 05/03/2013  . Parkinson disease 05/03/2013  . Chronic headache 05/03/2013  . Abnormality of gait 03/21/2012  . Paralysis agitans 03/21/2012   Achilles Dunk, OTR/L, CLT Alaysia Lightle 07/27/2014, 3:56 PM  [place] MAIN [facility] SERVICES [address] [place], [place], [address] Phone: [phone]   Fax:  [phone]

## 2014-07-30 ENCOUNTER — Ambulatory Visit: Payer: Medicare Other | Admitting: Occupational Therapy

## 2014-07-30 ENCOUNTER — Encounter: Payer: Self-pay | Admitting: Occupational Therapy

## 2014-07-30 DIAGNOSIS — R296 Repeated falls: Secondary | ICD-10-CM

## 2014-07-30 DIAGNOSIS — R262 Difficulty in walking, not elsewhere classified: Secondary | ICD-10-CM

## 2014-07-30 DIAGNOSIS — G20 Parkinson's disease: Secondary | ICD-10-CM | POA: Diagnosis not present

## 2014-07-30 DIAGNOSIS — M6281 Muscle weakness (generalized): Secondary | ICD-10-CM

## 2014-07-30 DIAGNOSIS — R279 Unspecified lack of coordination: Secondary | ICD-10-CM

## 2014-07-31 ENCOUNTER — Ambulatory Visit: Payer: Medicare Other | Admitting: Occupational Therapy

## 2014-07-31 ENCOUNTER — Encounter: Payer: Self-pay | Admitting: Occupational Therapy

## 2014-07-31 DIAGNOSIS — M6281 Muscle weakness (generalized): Secondary | ICD-10-CM

## 2014-07-31 DIAGNOSIS — R279 Unspecified lack of coordination: Secondary | ICD-10-CM

## 2014-07-31 DIAGNOSIS — R262 Difficulty in walking, not elsewhere classified: Secondary | ICD-10-CM

## 2014-07-31 DIAGNOSIS — R296 Repeated falls: Secondary | ICD-10-CM

## 2014-07-31 DIAGNOSIS — G20 Parkinson's disease: Secondary | ICD-10-CM | POA: Diagnosis not present

## 2014-07-31 NOTE — Therapy (Signed)
[place] MAIN [facility] SERVICES [address] [place], [place], [address] Phone: [phone]   Fax:  [phone]  Occupational Therapy Treatment  Patient Details  Name: Craig Dennis. MRN: [id number] Date of Birth: 1933-03-18 Referring Provider:  Vladimir Crofts, MD  Encounter Date: 07/31/2014      OT End of Session - 07/31/14 1540    Visit Number 11   Number of Visits 17   Date for OT Re-Evaluation 08/20/14   Authorization Type medicare G code 11   Authorization Time Period 17   OT Start Time 0930   OT Stop Time 1046   OT Time Calculation (min) 76 min   Equipment Utilized During Treatment rollator at times   Activity Tolerance Patient tolerated treatment well   Behavior During Therapy [facility] for tasks assessed/performed      Past Medical History  Diagnosis Date  . Parkinson's disease     Primarily right sided features  . Dyslipidemia   . History of chest pain   . Diabetes   . History of headache     chronic, recurrent, postconcussive, after fall from a collapsed deck, had been treated wtih prn fiorinal  . Hypertension   . Erectile dysfunction   . Colon polyps   . CAD (coronary artery disease)   . Plantar fasciitis   . Gait disorder   . Myocardial infarction   . Heart murmur   . Parkinson's disease   . BPH (benign prostatic hyperplasia)   . HOH (hard of hearing)   . Cancer     skin    Past Surgical History  Procedure Laterality Date  . Cardicac stent    . Hemorrhoid surgery    . Tonsillectomy and adenoidectomy    . Coronary angioplasty      stent  . Cataract extraction w/phaco Right 06/28/2014    Procedure: CATARACT EXTRACTION PHACO AND INTRAOCULAR LENS PLACEMENT (IOC);  Surgeon: Lyla Glassing, MD;  Location: [facility] ORS;  Service: Ophthalmology;  Laterality: Right;  [place]    1:07.4               AP     12.6             CDE   8.50    There were no vitals filed for this visit.  Visit Diagnosis:  Difficulty  walking  Muscle weakness (generalized)  Lack of coordination  Falls frequently      Subjective Assessment - 07/31/14 1532    Subjective  Patient states, "I have been thinking about yesterday and can't believe you saved me from that fall, I told [name] I thought I was going down and you managed to keep a 220 pound man up without blinking!"  Patient reports he agrees he needs to focus and work more on freezing of gait behaviors.   Patient is accompained by: Family member   Limitations disease progression, freezing of gait behaviors, fall risk   Patient Stated Goals Patient reports he would like to be as independent as possible, he wants to get back to going to the gym and walking on the treadmill and biking.  He wants his legs to be stronger, his balance better and safe with walking.  He would love to play golf again.     Currently in Pain? No/denies   Multiple Pain Sites No                      OT Treatments/Exercises (  OP) - 07/31/14 1534    ADLs   Overall ADLs Patient seen for functional component tasks of sit to stand, crossing right and left legs to prep for tying shoes, stairs, curb negotiation and turning in small spaces for 5 reps each activity, CGA as needed for turning behaviors and cues for moving BIG. Continued focus on walking through doorways, over thresholds and through the revolving door again this date to help with decreasing freezing of gait behaviors, reduction of falls and improved gait in these specific situations..   Neurological Re-education Exercises   Other Exercises 1 LSVT Daily Session Maximal Daily Exercises: Sustained movements are designed to rescale the amplitude of movement output for generalization to daily functional activities. Performed as follows for 1 set of 10 repetitions each: Multi directional sustained movements- 1) Floor to ceiling, 2) Side to side. Multi directional Repetitive movements performed in standing and are designed to provide  retraining effort needed for sustained muscle activation in tasks Performed as follows: 3) Step and reach forward, 4) Step and Reach Backwards, 5) Step and reach sideways, 6) Rock and reach forward/backward, 7) Rock and reach sideways. Sit to stand from mat table on lowest setting with cues for weight shift, technique and CGA for 5 reps for 2 sets. Patient seen for functional mobility tasks this date with emphasis on gait speed, length of steps with short distance ambulation, 300 feet for 4 trials without assistive device this date. Extended focus this date on multiple trials of managing gait with revolving door in and out of the hospital as well as on and off the elevator.  Patient requires cues for timing of initiation of gait in both situations and tends to freeze more when he sees the doors moving or when people are behind him.  Patient requires cues for BIG movements and cadence.                 OT Education - 07/31/14 1538    Education provided Yes   Education Details Continued focus on determining need for use of rollator or cane on specific days, maximal daily exercises and freezing of gait behaviors and analysis.   Person(s) Educated Patient;Spouse   Methods Explanation;Demonstration;Tactile cues;Verbal cues   Comprehension Verbalized understanding;Returned demonstration;Verbal cues required;Tactile cues required             OT Long Term Goals - 07/30/14 1630    OT LONG TERM GOAL #1   Title Patient will decrease frequency of freezing episodes with score of 11 or less on Freezing of Gait Questionnaire in 4 weeks.   Baseline significant freezing behaviors, score of 15 on Freezing of Gait Questionairre, performance can flucuate on a daily basis.   Time 4   Period Weeks   Status On-going   OT LONG TERM GOAL #2   Title Patient will improve gait speed and endurance and be able to walk 1500 feet in 6 minutes to negotiate around the home and community safely in 4 weeks.   Baseline  Patient now limits his community activities, avoids crowds if possible. Ambulating 1340 feet at evaluation, 1350 this date but with improved quality of steps, amplitude of gait and a reduction in freezing behaviors.   Time 4   Status On-going   OT LONG TERM GOAL #3   Title Patient will complete HEP for maximal daily exercises with modified independence in 4 weeks.   Baseline still requires cues with exercises and CGA for exercises in standing for balance.   Time  4   Period Weeks   Status On-going   OT LONG TERM GOAL #4   Title Patient will transfer from sit to stand without the use of arms safely and independently from a variety of chairs/surfaces in 4 weeks.    Baseline Patient able to complete now from normal chair height with cues, will progress to lower height chairs and unstable surfaces.   Time 4   Period Weeks   Status Partially Met   OT LONG TERM GOAL #5   Title Patient will demonstrate the ability to navigate through a crowd in a restaurant with minimal freezing of gait and no falls with cues if needed.   Baseline significant freezing behaviors now in restaurants and crowds which continues but can flucuate daily.   Time 4   Period Weeks   Status On-going               Plan - 07/31/14 1541    Clinical Impression Statement Patient continued to demonstrate freezing of gait behaviors this date but mostly at times when getting on and off the elevator, managing the revolving door and when approaching a crowd of people.  Spent extensive time this date on the timing of attempting to go in and out of doors that move as well as amplitude of steps in these situations.  Patient tends to freeze when he sees the doors moving and when he feels rushed or when he feels people are behind him.  Educated on safety when in the revolving doors and to not stop once he was out of the door way but to keep moving so that other people would not be stuck in the door behind.     Pt will benefit from  skilled therapeutic intervention in order to improve on the following deficits (Retired) Abnormal gait;Decreased strength;Decreased knowledge of use of DME;Difficulty walking;Decreased mobility;Impaired UE functional use;Decreased balance;Decreased coordination;Decreased endurance   Rehab Potential Good   Clinical Impairments Affecting Rehab Potential freezing of gait   OT Frequency 4x / week   OT Duration 4 weeks   OT Treatment/Interventions Self-care/ADL training;Gait Training;Functional Mobility Training;Therapeutic exercises;Neuromuscular education;Patient/family education;DME and/or AE instruction;Balance training   OT Home Exercise Plan Patient will be required to perform daily homework/exercise tasks as a part of the program.   Consulted and Agree with Plan of Care Patient;Family member/caregiver   Family Member Consulted wife          G-Codes - 2014/08/10 1634    Functional Assessment Tool Used clinical judgment, 6 minute walk test, freezing of gait questionairre, self care assessment   Functional Limitation Mobility: Walking and moving around   Mobility: Walking and Moving Around Current Status [phone]) At least 40 percent but less than 60 percent impaired, limited or restricted   Mobility: Walking and Moving Around Goal Status [phone]) At least 20 percent but less than 40 percent impaired, limited or restricted      Problem List Patient Active Problem List   Diagnosis Date Noted  . Skin erythema 06/05/2013  . Aortic stenosis 05/08/2013  . CAD S/P percutaneous coronary angioplasty 05/03/2013  . HTN (hypertension) 05/03/2013  . Type 2 diabetes mellitus with vascular disease 05/03/2013  . HLD (hyperlipidemia) 05/03/2013  . Parkinson disease 05/03/2013  . Chronic headache 05/03/2013  . Abnormality of gait 03/21/2012  . Paralysis agitans 03/21/2012   Amy T Tomasita Morrow, OTR/L, CLT Lovett,Amy 07/31/2014, 3:46 PM  [place] MAIN [facility] SERVICES [address] [place], [place], [address]  Phone: [phone]   Fax:  [phone]

## 2014-07-31 NOTE — Therapy (Signed)
[place] MAIN [facility] SERVICES [address] [place], [place], [address] Phone: [phone]   Fax:  [phone]  Occupational Therapy Treatment  Patient Details  Name: Craig Dennis. MRN: [id number] Date of Birth: 02-Apr-1933 Referring Provider:  Vladimir Crofts, MD  Encounter Date: 07/30/2014      OT End of Session - 07/30/14 1629    Visit Number 10   Number of Visits 17   Date for OT Re-Evaluation 08/20/14   Authorization Type medicare G code 10   OT Start Time 0900   OT Stop Time 1000   OT Time Calculation (min) 60 min   Equipment Utilized During Treatment rollator at times   Activity Tolerance Patient tolerated treatment well   Behavior During Therapy [facility] for tasks assessed/performed      Past Medical History  Diagnosis Date  . Parkinson's disease     Primarily right sided features  . Dyslipidemia   . History of chest pain   . Diabetes   . History of headache     chronic, recurrent, postconcussive, after fall from a collapsed deck, had been treated wtih prn fiorinal  . Hypertension   . Erectile dysfunction   . Colon polyps   . CAD (coronary artery disease)   . Plantar fasciitis   . Gait disorder   . Myocardial infarction   . Heart murmur   . Parkinson's disease   . BPH (benign prostatic hyperplasia)   . HOH (hard of hearing)   . Cancer     skin    Past Surgical History  Procedure Laterality Date  . Cardicac stent    . Hemorrhoid surgery    . Tonsillectomy and adenoidectomy    . Coronary angioplasty      stent  . Cataract extraction w/phaco Right 06/28/2014    Procedure: CATARACT EXTRACTION PHACO AND INTRAOCULAR LENS PLACEMENT (IOC);  Surgeon: Lyla Glassing, MD;  Location: [facility] ORS;  Service: Ophthalmology;  Laterality: Right;  [place]    1:07.4               AP     12.6             CDE   8.50    There were no vitals filed for this visit.  Visit Diagnosis:  Difficulty walking  Muscle weakness  (generalized)  Lack of coordination  Falls frequently      Subjective Assessment - 07/30/14 1624    Subjective  Patient reports he and his wife went to [facility] on Friday after therapy and he got really tired and could only make it about 1/2 way around the store. Reports he still is experiencing freezing behaviors in crowds and when going through doorways, especially through the revolving door at the front of the hospital.     Patient is accompained by: Family member   Limitations disease progression, freezing of gait behaviors, fall risk   Patient Stated Goals Patient reports he would like to be as independent as possible, he wants to get back to going to the gym and walking on the treadmill and biking.  He wants his legs to be stronger, his balance better and safe with walking.  He would love to play golf again.     Currently in Pain? No/denies   Multiple Pain Sites No                      OT Treatments/Exercises (OP) - 07/30/14 1627  ADLs   Overall ADLs Patient seen for functional component tasks of sit to stand, crossing right and left legs to prep for tying shoes, stairs, curb negotiation and turning in small spaces for 5 reps each activity, CGA as needed for turning behaviors and cues for moving BIG.  Added focus on walking through doorways, over thresholds and through the revolving door since this is where patient has the most difficulty with freezing of gait behaviors.   Neurological Re-education Exercises   Other Exercises 1 LSVT Daily Session Maximal Daily Exercises: Sustained movements are designed to rescale the amplitude of movement output for generalization to daily functional activities. Performed as follows for 1 set of 10 repetitions each: Multi directional sustained movements- 1) Floor to ceiling, 2) Side to side. Multi directional Repetitive movements performed in standing and are designed to provide retraining effort needed for sustained muscle activation in  tasks Performed as follows: 3) Step and reach forward, 4) Step and Reach Backwards, 5) Step and reach sideways, 6) Rock and reach forward/backward, 7) Rock and reach sideways. Sit to stand from mat table on lowest setting with cues for weight shift, technique and CGA for 5 reps for 2 sets. Patient seen for functional mobility tasks this date with emphasis on gait speed, length of steps with short distance ambulation, 300 feet for 4 trials without assistive device this date. Patient requires cues for BIG movements and cadence.                  OT Education - 07/30/14 1628    Education provided Yes   Education Details Continued focus on strategies to manage freezing of gait behaviors at home and in the community.   Person(s) Educated Patient;Spouse   Methods Explanation;Demonstration;Tactile cues;Verbal cues   Comprehension Verbal cues required;Returned demonstration;Verbalized understanding;Tactile cues required             OT Long Term Goals - 07/30/14 1630    OT LONG TERM GOAL #1   Title Patient will decrease frequency of freezing episodes with score of 11 or less on Freezing of Gait Questionnaire in 4 weeks.   Baseline significant freezing behaviors, score of 15 on Freezing of Gait Questionairre, performance can flucuate on a daily basis.   Time 4   Period Weeks   Status On-going   OT LONG TERM GOAL #2   Title Patient will improve gait speed and endurance and be able to walk 1500 feet in 6 minutes to negotiate around the home and community safely in 4 weeks.   Baseline Patient now limits his community activities, avoids crowds if possible. Ambulating 1340 feet at evaluation, 1350 this date but with improved quality of steps, amplitude of gait and a reduction in freezing behaviors.   Time 4   Status On-going   OT LONG TERM GOAL #3   Title Patient will complete HEP for maximal daily exercises with modified independence in 4 weeks.   Baseline still requires cues with exercises  and CGA for exercises in standing for balance.   Time 4   Period Weeks   Status On-going   OT LONG TERM GOAL #4   Title Patient will transfer from sit to stand without the use of arms safely and independently from a variety of chairs/surfaces in 4 weeks.    Baseline Patient able to complete now from normal chair height with cues, will progress to lower height chairs and unstable surfaces.   Time 4   Period Weeks   Status Partially  Met   OT LONG TERM GOAL #5   Title Patient will demonstrate the ability to navigate through a crowd in a restaurant with minimal freezing of gait and no falls with cues if needed.   Baseline significant freezing behaviors now in restaurants and crowds which continues but can flucuate daily.   Time 4   Period Weeks   Status On-going               Plan - 2014/08/24 1205    Clinical Impression Statement Patient has made excellent progress in the first couple of weeks of LSVT BIG program with functional gait demonstrating increased amplitude of steps, improved balance and a reduction of falls and freezing of gait episodes.  He is engaging in functional component tasks and is also working at home on maximal daily exercises.  His performance does flucuate from day to day especially in the area of freezing of gait patterns.  He demonstrates significant freezing behaviors and working towards diminishing/extinguishing these in the clinic.  He responds well to cues, both verbal and tactile.  He demonstrates these behaviors more in crowded spaces, thresholds/doorways and especially with the revolving door at the entrance of the hospital.  We have added these areas to his functional component skills to work on each time in therapy.  This date he did have one major loss of balance when ambulating outdoors with therapist and going up an incline where the sidewalk progressed from brick pavers to smooth surface..  Patient could not perform self recovery with balance and therapist  responded and had to provide moderate assist to prevent fall from occurring.  Patient continues to benefit from skilled therapeutic intervention for balance, gait, functional mobility and self care tasks following LSVT BIG protocol.     Pt will benefit from skilled therapeutic intervention in order to improve on the following deficits (Retired) Abnormal gait;Decreased strength;Decreased knowledge of use of DME;Difficulty walking;Decreased mobility;Impaired UE functional use;Decreased balance;Decreased coordination;Decreased endurance   Rehab Potential Good   Clinical Impairments Affecting Rehab Potential freezing of gait   OT Frequency 4x / week   OT Duration 4 weeks   OT Treatment/Interventions Self-care/ADL training;Gait Training;Functional Mobility Training;Therapeutic exercises;Neuromuscular education;Patient/family education;DME and/or AE instruction;Balance training   OT Home Exercise Plan Patient will be required to perform daily homework/exercise tasks as a part of the program.   Consulted and Agree with Plan of Care Patient;Family member/caregiver   Family Member Consulted wife          G-Codes - 24-Aug-2014 1634    Functional Assessment Tool Used clinical judgment, 6 minute walk test, freezing of gait questionairre, self care assessment   Functional Limitation Mobility: Walking and moving around   Mobility: Walking and Moving Around Current Status [phone]) At least 40 percent but less than 60 percent impaired, limited or restricted   Mobility: Walking and Moving Around Goal Status [phone]) At least 20 percent but less than 40 percent impaired, limited or restricted      Problem List Patient Active Problem List   Diagnosis Date Noted  . Skin erythema 06/05/2013  . Aortic stenosis 05/08/2013  . CAD S/P percutaneous coronary angioplasty 05/03/2013  . HTN (hypertension) 05/03/2013  . Type 2 diabetes mellitus with vascular disease 05/03/2013  . HLD (hyperlipidemia) 05/03/2013  .  Parkinson disease 05/03/2013  . Chronic headache 05/03/2013  . Abnormality of gait 03/21/2012  . Paralysis agitans 03/21/2012   Amy T Lovett, OTR/L, CLT Lovett,Amy 07/31/2014, 12:13 PM  [place]  [place], [place], [address] Phone: [phone]   Fax:  [phone]

## 2014-08-01 ENCOUNTER — Ambulatory Visit: Payer: Medicare Other | Admitting: Occupational Therapy

## 2014-08-01 ENCOUNTER — Encounter: Payer: Self-pay | Admitting: Occupational Therapy

## 2014-08-01 DIAGNOSIS — R296 Repeated falls: Secondary | ICD-10-CM

## 2014-08-01 DIAGNOSIS — R262 Difficulty in walking, not elsewhere classified: Secondary | ICD-10-CM

## 2014-08-01 DIAGNOSIS — G20 Parkinson's disease: Secondary | ICD-10-CM | POA: Diagnosis not present

## 2014-08-01 DIAGNOSIS — R279 Unspecified lack of coordination: Secondary | ICD-10-CM

## 2014-08-01 DIAGNOSIS — M6281 Muscle weakness (generalized): Secondary | ICD-10-CM

## 2014-08-02 ENCOUNTER — Ambulatory Visit: Payer: Medicare Other | Admitting: Occupational Therapy

## 2014-08-02 ENCOUNTER — Encounter: Payer: Self-pay | Admitting: Occupational Therapy

## 2014-08-02 DIAGNOSIS — R296 Repeated falls: Secondary | ICD-10-CM

## 2014-08-02 DIAGNOSIS — G20 Parkinson's disease: Secondary | ICD-10-CM | POA: Diagnosis not present

## 2014-08-02 DIAGNOSIS — M6281 Muscle weakness (generalized): Secondary | ICD-10-CM

## 2014-08-02 DIAGNOSIS — R262 Difficulty in walking, not elsewhere classified: Secondary | ICD-10-CM

## 2014-08-02 DIAGNOSIS — R279 Unspecified lack of coordination: Secondary | ICD-10-CM

## 2014-08-02 NOTE — Therapy (Signed)
[place] MAIN [facility] SERVICES [address] [place], [place], [address] Phone: [phone]   Fax:  [phone]  Occupational Therapy Treatment  Patient Details  Name: Craig Dennis. MRN: [id number] Date of Birth: 11-15-1933 Referring Provider:  Vladimir Crofts, MD  Encounter Date: 08/02/2014      OT End of Session - 08/02/14 1553    Visit Number 13   Number of Visits 17   Date for OT Re-Evaluation 08/20/14   Authorization Type medicare G code 13   Authorization Time Period 17   OT Start Time 1305   OT Stop Time 1410   OT Time Calculation (min) 65 min   Equipment Utilized During Treatment rollator at times   Activity Tolerance Patient tolerated treatment well   Behavior During Therapy [facility] for tasks assessed/performed      Past Medical History  Diagnosis Date  . Parkinson's disease     Primarily right sided features  . Dyslipidemia   . History of chest pain   . Diabetes   . History of headache     chronic, recurrent, postconcussive, after fall from a collapsed deck, had been treated wtih prn fiorinal  . Hypertension   . Erectile dysfunction   . Colon polyps   . CAD (coronary artery disease)   . Plantar fasciitis   . Gait disorder   . Myocardial infarction   . Heart murmur   . Parkinson's disease   . BPH (benign prostatic hyperplasia)   . HOH (hard of hearing)   . Cancer     skin    Past Surgical History  Procedure Laterality Date  . Cardicac stent    . Hemorrhoid surgery    . Tonsillectomy and adenoidectomy    . Coronary angioplasty      stent  . Cataract extraction w/phaco Right 06/28/2014    Procedure: CATARACT EXTRACTION PHACO AND INTRAOCULAR LENS PLACEMENT (IOC);  Surgeon: Lyla Glassing, MD;  Location: [facility] ORS;  Service: Ophthalmology;  Laterality: Right;  [place]    1:07.4               AP     12.6             CDE   8.50    There were no vitals filed for this visit.  Visit Diagnosis:  Difficulty  walking  Muscle weakness (generalized)  Lack of coordination  Falls frequently      Subjective Assessment - 08/02/14 1551    Subjective  Patient reports they had another appointment this morning and he missed taking his dose of medication.  Took his meds right at the beginning of therapy session.   Patient is accompained by: Family member   Limitations disease progression, freezing of gait behaviors, fall risk   Patient Stated Goals Patient reports he would like to be as independent as possible, he wants to get back to going to the gym and walking on the treadmill and biking.  He wants his legs to be stronger, his balance better and safe with walking.  He would love to play golf again.     Currently in Pain? No/denies                      OT Treatments/Exercises (OP) - 08/02/14 1552    ADLs   Overall ADLs negotiation and turning in small spaces for 5 reps each activity, CGA as needed for turning behaviors and cues for moving BIG.  Continued focus on walking through doorways, over thresholds and through the revolving door again this date to help with decreasing freezing of gait behaviors, reduction of falls and improved gait in these specific situations.   Neurological Re-education Exercises   Other Exercises 1 LSVT Daily Session Maximal Daily Exercises: Sustained movements are designed to rescale the amplitude of movement output for generalization to daily functional activities. Performed as follows for 1 set of 10 repetitions each: Multi directional sustained movements- 1) Floor to ceiling, 2) Side to side. Multi directional Repetitive movements performed in standing and are designed to provide retraining effort needed for sustained muscle activation in tasks Performed as follows: 3) Step and reach forward, 4) Step and Reach Backwards, 5) Step and reach sideways, 6) Rock and reach forward/backward, 7) Rock and reach sideways. Sit to stand from mat table on lowest setting with  cues for weight shift, technique and CGA for 5 reps for 2 sets. Patient progressed to lower soft chairs in the hospital lobby and was able to complete with CGA to minimal assist for 5 reps this date. Patient seen for functional mobility tasks this date with emphasis on gait speed, length of steps with short distance ambulation, 300 feet for 4 trials without assistive device this date. Extended focus this date on multiple trials of managing gait with revolving door in and out of the hospital as well as on and off the elevator. Patient requires cues for timing of initiation of gait in both situations and tends to freeze more when he sees the doors moving or when people are behind him. Patient requires cues for BIG movements and cadence.                 OT Education - 08/02/14 1552    Education provided Yes   Education Details Reminder to try to take medication on time to optimize therapy intervention.  HEP   Person(s) Educated Patient   Methods Explanation;Demonstration   Comprehension Verbalized understanding;Returned demonstration             OT Long Term Goals - 07/30/14 1630    OT LONG TERM GOAL #1   Title Patient will decrease frequency of freezing episodes with score of 11 or less on Freezing of Gait Questionnaire in 4 weeks.   Baseline significant freezing behaviors, score of 15 on Freezing of Gait Questionairre, performance can flucuate on a daily basis.   Time 4   Period Weeks   Status On-going   OT LONG TERM GOAL #2   Title Patient will improve gait speed and endurance and be able to walk 1500 feet in 6 minutes to negotiate around the home and community safely in 4 weeks.   Baseline Patient now limits his community activities, avoids crowds if possible. Ambulating 1340 feet at evaluation, 1350 this date but with improved quality of steps, amplitude of gait and a reduction in freezing behaviors.   Time 4   Status On-going   OT LONG TERM GOAL #3   Title Patient will  complete HEP for maximal daily exercises with modified independence in 4 weeks.   Baseline still requires cues with exercises and CGA for exercises in standing for balance.   Time 4   Period Weeks   Status On-going   OT LONG TERM GOAL #4   Title Patient will transfer from sit to stand without the use of arms safely and independently from a variety of chairs/surfaces in 4 weeks.    Baseline Patient able to  complete now from normal chair height with cues, will progress to lower height chairs and unstable surfaces.   Time 4   Period Weeks   Status Partially Met   OT LONG TERM GOAL #5   Title Patient will demonstrate the ability to navigate through a crowd in a restaurant with minimal freezing of gait and no falls with cues if needed.   Baseline significant freezing behaviors now in restaurants and crowds which continues but can flucuate daily.   Time 4   Period Weeks   Status On-going               Plan - 08/02/14 1554    Clinical Impression Statement Patient did well this date despite forgetting to take his medicine on time. Discussed the importance of the timing of medication and the need to take it as recommended.  He had 2-3 episodes of freezing at random times but not when going through the thresholds or automatic doors this date.  His biggest challenge is the revolving door at the hospital entrance and crowded elevator.  Will continue to focus on these tasks each session.  He still requires CGA when not using the rollator since the potential for freezing has been great.     Pt will benefit from skilled therapeutic intervention in order to improve on the following deficits (Retired) Abnormal gait;Decreased strength;Decreased knowledge of use of DME;Difficulty walking;Decreased mobility;Impaired UE functional use;Decreased balance;Decreased coordination;Decreased endurance   Rehab Potential Good   Clinical Impairments Affecting Rehab Potential freezing of gait   OT Frequency 4x /  week   OT Duration 4 weeks   OT Treatment/Interventions Self-care/ADL training;Gait Training;Functional Mobility Training;Therapeutic exercises;Neuromuscular education;Patient/family education;DME and/or AE instruction;Balance training   OT Home Exercise Plan Patient is required to perform daily homework/exercise tasks as a part of the program.        Problem List Patient Active Problem List   Diagnosis Date Noted  . Skin erythema 06/05/2013  . Aortic stenosis 05/08/2013  . CAD S/P percutaneous coronary angioplasty 05/03/2013  . HTN (hypertension) 05/03/2013  . Type 2 diabetes mellitus with vascular disease 05/03/2013  . HLD (hyperlipidemia) 05/03/2013  . Parkinson disease 05/03/2013  . Chronic headache 05/03/2013  . Abnormality of gait 03/21/2012  . Paralysis agitans 03/21/2012   Achilles Dunk, OTR/L, CLT Dean Goldner 08/02/2014, 6:17 PM  [place] MAIN [facility] SERVICES [address] [place], [place], [address] Phone: [phone]   Fax:  [phone]

## 2014-08-02 NOTE — Therapy (Signed)
[place] MAIN [facility] SERVICES [address] [place], [place], [address] Phone: [phone]   Fax:  [phone]  Occupational Therapy Treatment  Patient Details  Name: Craig Dennis. MRN: [id number] Date of Birth: November 12, 1933 Referring Provider:  Vladimir Crofts, MD  Encounter Date: 08/01/2014      OT End of Session - 08/01/14 1709    Visit Number 12   Number of Visits 17   Date for OT Re-Evaluation 08/20/14   Authorization Type medicare G code 12   Authorization Time Period 17   OT Start Time 0900   OT Stop Time 1000   OT Time Calculation (min) 60 min   Equipment Utilized During Treatment rollator at times   Activity Tolerance Patient tolerated treatment well   Behavior During Therapy [facility] for tasks assessed/performed      Past Medical History  Diagnosis Date  . Parkinson's disease     Primarily right sided features  . Dyslipidemia   . History of chest pain   . Diabetes   . History of headache     chronic, recurrent, postconcussive, after fall from a collapsed deck, had been treated wtih prn fiorinal  . Hypertension   . Erectile dysfunction   . Colon polyps   . CAD (coronary artery disease)   . Plantar fasciitis   . Gait disorder   . Myocardial infarction   . Heart murmur   . Parkinson's disease   . BPH (benign prostatic hyperplasia)   . HOH (hard of hearing)   . Cancer     skin    Past Surgical History  Procedure Laterality Date  . Cardicac stent    . Hemorrhoid surgery    . Tonsillectomy and adenoidectomy    . Coronary angioplasty      stent  . Cataract extraction w/phaco Right 06/28/2014    Procedure: CATARACT EXTRACTION PHACO AND INTRAOCULAR LENS PLACEMENT (IOC);  Surgeon: Lyla Glassing, MD;  Location: [facility] ORS;  Service: Ophthalmology;  Laterality: Right;  [place]    1:07.4               AP     12.6             CDE   8.50    There were no vitals filed for this visit.  Visit Diagnosis:  Difficulty  walking  Muscle weakness (generalized)  Lack of coordination  Falls frequently      Subjective Assessment - 08/01/14 1708    Subjective  Patient reports he is trying a different pair of shoes today.  Reports he got up and started getting ready and did not take his medication at the correct time and is now moving slower than normal.  "I will do whatever I can and hope it kicks in by the time we need to walk.  I am not sure how good I will be at walking today."   Patient is accompained by: Family member   Limitations disease progression, freezing of gait behaviors, fall risk   Patient Stated Goals Patient reports he would like to be as independent as possible, he wants to get back to going to the gym and walking on the treadmill and biking.  He wants his legs to be stronger, his balance better and safe with walking.  He would love to play golf again.     Currently in Pain? No/denies   Multiple Pain Sites No  OT Treatments/Exercises (OP) - 08/01/14 1709    ADLs   Overall ADLs Patient seen for functional component tasks of sit to stand, crossing right and left legs to prep for tying shoes, stairs, curb negotiation and turning in small spaces for 5 reps each activity, CGA as needed for turning behaviors and cues for moving BIG. Continued focus on walking through doorways, over thresholds and through the revolving door again this date to help with decreasing freezing of gait behaviors, reduction of falls and improved gait in these specific situations..   Neurological Re-education Exercises   Other Exercises 1 LSVT Daily Session Maximal Daily Exercises: Sustained movements are designed to rescale the amplitude of movement output for generalization to daily functional activities. Performed as follows for 1 set of 10 repetitions each: Multi directional sustained movements- 1) Floor to ceiling, 2) Side to side. Multi directional Repetitive movements performed in  standing and are designed to provide retraining effort needed for sustained muscle activation in tasks Performed as follows: 3) Step and reach forward, 4) Step and Reach Backwards, 5) Step and reach sideways, 6) Rock and reach forward/backward, 7) Rock and reach sideways. Sit to stand from mat table on lowest setting with cues for weight shift, technique and CGA for 5 reps for 2 sets.  Patient progressed to lower soft chairs in the hospital lobby and was able to complete with CGA to minimal assist for 5 reps this date.  Patient seen for functional mobility tasks this date with emphasis on gait speed, length of steps with short distance ambulation, 300 feet for 4 trials without assistive device this date. Extended focus this date on multiple trials of managing gait with revolving door in and out of the hospital as well as on and off the elevator. Patient requires cues for timing of initiation of gait in both situations and tends to freeze more when he sees the doors moving or when people are behind him. Patient requires cues for BIG movements and cadence.                 OT Education - 08/01/14 1709    Education provided Yes   Education Details Sit to stand from lower chair, managing crowded spaces, managing automatic opening doors, revolving doors.    Methods Explanation;Demonstration;Verbal cues;Tactile cues   Comprehension Returned demonstration;Verbal cues required;Tactile cues required             OT Long Term Goals - 07/30/14 1630    OT LONG TERM GOAL #1   Title Patient will decrease frequency of freezing episodes with score of 11 or less on Freezing of Gait Questionnaire in 4 weeks.   Baseline significant freezing behaviors, score of 15 on Freezing of Gait Questionairre, performance can flucuate on a daily basis.   Time 4   Period Weeks   Status On-going   OT LONG TERM GOAL #2   Title Patient will improve gait speed and endurance and be able to walk 1500 feet in 6 minutes to  negotiate around the home and community safely in 4 weeks.   Baseline Patient now limits his community activities, avoids crowds if possible. Ambulating 1340 feet at evaluation, 1350 this date but with improved quality of steps, amplitude of gait and a reduction in freezing behaviors.   Time 4   Status On-going   OT LONG TERM GOAL #3   Title Patient will complete HEP for maximal daily exercises with modified independence in 4 weeks.   Baseline still requires cues  with exercises and CGA for exercises in standing for balance.   Time 4   Period Weeks   Status On-going   OT LONG TERM GOAL #4   Title Patient will transfer from sit to stand without the use of arms safely and independently from a variety of chairs/surfaces in 4 weeks.    Baseline Patient able to complete now from normal chair height with cues, will progress to lower height chairs and unstable surfaces.   Time 4   Period Weeks   Status Partially Met   OT LONG TERM GOAL #5   Title Patient will demonstrate the ability to navigate through a crowd in a restaurant with minimal freezing of gait and no falls with cues if needed.   Baseline significant freezing behaviors now in restaurants and crowds which continues but can flucuate daily.   Time 4   Period Weeks   Status On-going               Plan - 08/01/14 1711    Clinical Impression Statement Patient was slow to move this date which may be largely in part due to not taking his medication on time this date.  His performance did improve as the session went on and he did well with functional mobility without the use of an assistive device.  He had a freezing episode when attempting to manage a small space but when the task was repeated 2 more times he completed it without hesistation or difficulty.  Will continue to work towards improving safety with mobiliity and diminish freezing of gait behaviors.    Pt will benefit from skilled therapeutic intervention in order to improve on  the following deficits (Retired) Abnormal gait;Decreased strength;Decreased knowledge of use of DME;Difficulty walking;Decreased mobility;Impaired UE functional use;Decreased balance;Decreased coordination;Decreased endurance   Rehab Potential Good   Clinical Impairments Affecting Rehab Potential freezing of gait   OT Frequency 4x / week   OT Duration 4 weeks   OT Treatment/Interventions Self-care/ADL training;Gait Training;Functional Mobility Training;Therapeutic exercises;Neuromuscular education;Patient/family education;DME and/or AE instruction;Balance training   OT Home Exercise Plan Patient will be required to perform daily homework/exercise tasks as a part of the program.   Consulted and Agree with Plan of Care Patient;Family member/caregiver   Family Member Consulted wife        Problem List Patient Active Problem List   Diagnosis Date Noted  . Skin erythema 06/05/2013  . Aortic stenosis 05/08/2013  . CAD S/P percutaneous coronary angioplasty 05/03/2013  . HTN (hypertension) 05/03/2013  . Type 2 diabetes mellitus with vascular disease 05/03/2013  . HLD (hyperlipidemia) 05/03/2013  . Parkinson disease 05/03/2013  . Chronic headache 05/03/2013  . Abnormality of gait 03/21/2012  . Paralysis agitans 03/21/2012   Achilles Dunk, OTR/L, CLT Lovett,Amy 08/02/2014, 10:45 AM  [place] MAIN [facility] SERVICES [address] [place], [place], [address] Phone: [phone]   Fax:  [phone]

## 2014-08-07 ENCOUNTER — Encounter: Payer: Self-pay | Admitting: Occupational Therapy

## 2014-08-07 ENCOUNTER — Ambulatory Visit: Payer: Medicare Other | Attending: Neurology | Admitting: Occupational Therapy

## 2014-08-07 ENCOUNTER — Ambulatory Visit: Payer: Medicare Other | Admitting: Occupational Therapy

## 2014-08-07 DIAGNOSIS — R279 Unspecified lack of coordination: Secondary | ICD-10-CM

## 2014-08-07 DIAGNOSIS — R296 Repeated falls: Secondary | ICD-10-CM | POA: Insufficient documentation

## 2014-08-07 DIAGNOSIS — M6281 Muscle weakness (generalized): Secondary | ICD-10-CM | POA: Diagnosis not present

## 2014-08-07 DIAGNOSIS — R262 Difficulty in walking, not elsewhere classified: Secondary | ICD-10-CM | POA: Insufficient documentation

## 2014-08-07 NOTE — Therapy (Signed)
[place] MAIN [facility] SERVICES [address] [place], [place], [address] Phone: [phone]   Fax:  [phone]  Occupational Therapy Treatment  Patient Details  Name: Craig Dennis. MRN: [id number] Date of Birth: Dec 25, 1933 Referring Provider:  Vladimir Crofts, MD  Encounter Date: 08/07/2014      OT End of Session - 08/07/14 1553    Visit Number 14   Number of Visits 17   Date for OT Re-Evaluation 08/20/14   Authorization Type medicare G code 14   Authorization Time Period 17   OT Start Time 1002   OT Stop Time 1110   OT Time Calculation (min) 68 min   Equipment Utilized During Treatment rollator at times   Activity Tolerance Patient tolerated treatment well   Behavior During Therapy [facility] for tasks assessed/performed      Past Medical History  Diagnosis Date  . Parkinson's disease     Primarily right sided features  . Dyslipidemia   . History of chest pain   . Diabetes   . History of headache     chronic, recurrent, postconcussive, after fall from a collapsed deck, had been treated wtih prn fiorinal  . Hypertension   . Erectile dysfunction   . Colon polyps   . CAD (coronary artery disease)   . Plantar fasciitis   . Gait disorder   . Myocardial infarction   . Heart murmur   . Parkinson's disease   . BPH (benign prostatic hyperplasia)   . HOH (hard of hearing)   . Cancer     skin    Past Surgical History  Procedure Laterality Date  . Cardicac stent    . Hemorrhoid surgery    . Tonsillectomy and adenoidectomy    . Coronary angioplasty      stent  . Cataract extraction w/phaco Right 06/28/2014    Procedure: CATARACT EXTRACTION PHACO AND INTRAOCULAR LENS PLACEMENT (IOC);  Surgeon: Lyla Glassing, MD;  Location: [facility] ORS;  Service: Ophthalmology;  Laterality: Right;  [place]    1:07.4               AP     12.6             CDE   8.50    There were no vitals filed for this visit.  Visit Diagnosis:  Difficulty  walking  Muscle weakness (generalized)  Lack of coordination      Subjective Assessment - 08/07/14 1547    Subjective  Patient's wife reports patient has not done well over the last 4 days, reports freezing of gait on multiple occasions and even on the way to therapy this am.  Had significant difficulty this date with revolving door.  Wife states, "I am not sure why he responds so well to you in therapy but then I have trouble with him at home. "  Discussed some cues which may help in freezing of gait situations.    Patient is accompained by: Family member   Limitations disease progression, freezing of gait behaviors, fall risk   Special Tests 6 minute walk test, 5 times sit to stand, BERG balance test   Patient Stated Goals Patient reports he would like to be as independent as possible, he wants to get back to going to the gym and walking on the treadmill and biking.  He wants his legs to be stronger, his balance better and safe with walking.  He would love to play golf again.  Currently in Pain? No/denies   Multiple Pain Sites No                      OT Treatments/Exercises (OP) - 08/07/14 1549    ADLs   Overall ADLs Patient seen for functional component tasks of sit to stand, crossing right and left legs to prep for tying shoes, stairs, curb negotiation and turning in small spaces for 5 reps each activity, CGA as needed for turning behaviors and cues for moving BIG. Continued focus on walking through doorways, over thresholds and through the revolving door again this date to help with decreasing freezing of gait behaviors, reduction of falls and improved gait in these specific situations.   Neurological Re-education Exercises   Other Exercises 1 LSVT Daily Session Maximal Daily Exercises: Sustained movements are designed to rescale the amplitude of movement output for generalization to daily functional activities. Performed as follows for 1 set of 10 repetitions each: Multi  directional sustained movements- 1) Floor to ceiling, 2) Side to side. Multi directional Repetitive movements performed in standing and are designed to provide retraining effort needed for sustained muscle activation in tasks Performed as follows: 3) Step and reach forward, 4) Step and Reach Backwards, 5) Step and reach sideways, 6) Rock and reach forward/backward, 7) Rock and reach sideways. Sit to stand from mat table on lowest setting with cues for weight shift, technique and CGA for 5 reps for 2 sets. Patient progressed to lower soft chairs in the hospital lobby and was able to complete with CGA to minimal assist for 5 reps this date. Patient seen for functional mobility tasks this date with emphasis on gait speed, length of steps with short distance ambulation, 200 feet for 2 trials without assistive device this date. Continued focus this date on multiple trials of managing gait with revolving door (especially since he had increased difficulty this date just getting into the clinic)  in and out of the hospital as well as on and off the elevator. Patient requires cues for timing of initiation of gait in both situations and tends to freeze more when he sees the doors moving or when people are behind him. Patient requires cues for BIG movements and cadence.                 OT Education - 08/07/14 1552    Education provided Yes   Education Details Freezing of gait behaviors, use of a march step to initiate gait, importance of being consistent especially days he is not in therapy.   Person(s) Educated Patient;Spouse   Methods Explanation;Demonstration;Verbal cues;Tactile cues   Comprehension Verbalized understanding;Returned demonstration;Verbal cues required;Tactile cues required             OT Long Term Goals - 07/30/14 1630    OT LONG TERM GOAL #1   Title Patient will decrease frequency of freezing episodes with score of 11 or less on Freezing of Gait Questionnaire in 4 weeks.    Baseline significant freezing behaviors, score of 15 on Freezing of Gait Questionairre, performance can flucuate on a daily basis.   Time 4   Period Weeks   Status On-going   OT LONG TERM GOAL #2   Title Patient will improve gait speed and endurance and be able to walk 1500 feet in 6 minutes to negotiate around the home and community safely in 4 weeks.   Baseline Patient now limits his community activities, avoids crowds if possible. Ambulating 1340 feet at evaluation,  1350 this date but with improved quality of steps, amplitude of gait and a reduction in freezing behaviors.   Time 4   Status On-going   OT LONG TERM GOAL #3   Title Patient will complete HEP for maximal daily exercises with modified independence in 4 weeks.   Baseline still requires cues with exercises and CGA for exercises in standing for balance.   Time 4   Period Weeks   Status On-going   OT LONG TERM GOAL #4   Title Patient will transfer from sit to stand without the use of arms safely and independently from a variety of chairs/surfaces in 4 weeks.    Baseline Patient able to complete now from normal chair height with cues, will progress to lower height chairs and unstable surfaces.   Time 4   Period Weeks   Status Partially Met   OT LONG TERM GOAL #5   Title Patient will demonstrate the ability to navigate through a crowd in a restaurant with minimal freezing of gait and no falls with cues if needed.   Baseline significant freezing behaviors now in restaurants and crowds which continues but can flucuate daily.   Time 4   Period Weeks   Status On-going               Plan - 08/07/14 1554    Clinical Impression Statement Patient agrees he was not consistent with performance of exercises this weekend and had been busy over the holiday.  Discussed the importance of completing the exercises now 2 times a day, which needs to be a priority and he should view the exercises as a part of his "must have routines" .  His  wife reports he had significant freezing behaviors the last 4 days and feels he responds well to therapist and while in therapy sessions but not sure why it doesn't always carryover in daily routines.  Implemented some marching steps as an intiator step when freezing and this appeared to improve this date.  A march step tends to trigger a proprioceptive response and gets leg up and moving from the floor versus the shuffling step.   Pt will benefit from skilled therapeutic intervention in order to improve on the following deficits (Retired) Abnormal gait;Decreased strength;Decreased knowledge of use of DME;Difficulty walking;Decreased mobility;Impaired UE functional use;Decreased balance;Decreased coordination;Decreased endurance   Rehab Potential Good   Clinical Impairments Affecting Rehab Potential freezing of gait   OT Frequency 4x / week   OT Duration 4 weeks   OT Treatment/Interventions Self-care/ADL training;Gait Training;Functional Mobility Training;Therapeutic exercises;Neuromuscular education;Patient/family education;DME and/or AE instruction;Balance training   OT Home Exercise Plan Patient is required to perform daily homework/exercise tasks as a part of the program.   Consulted and Agree with Plan of Care Patient;Family member/caregiver   Family Member Consulted wife        Problem List Patient Active Problem List   Diagnosis Date Noted  . Skin erythema 06/05/2013  . Aortic stenosis 05/08/2013  . CAD S/P percutaneous coronary angioplasty 05/03/2013  . HTN (hypertension) 05/03/2013  . Type 2 diabetes mellitus with vascular disease 05/03/2013  . HLD (hyperlipidemia) 05/03/2013  . Parkinson disease 05/03/2013  . Chronic headache 05/03/2013  . Abnormality of gait 03/21/2012  . Paralysis agitans 03/21/2012   Achilles Dunk, OTR/L, CLT Craig Dennis 08/07/2014, 4:00 PM  [employer] MAIN [facility] SERVICES [address] [place], [place], [address] Phone:  [phone]   Fax:  [phone]

## 2014-08-08 ENCOUNTER — Encounter: Payer: Self-pay | Admitting: Occupational Therapy

## 2014-08-08 ENCOUNTER — Ambulatory Visit: Payer: Medicare Other | Attending: Neurology | Admitting: Occupational Therapy

## 2014-08-08 ENCOUNTER — Ambulatory Visit: Payer: Medicare Other | Admitting: Occupational Therapy

## 2014-08-08 DIAGNOSIS — R296 Repeated falls: Secondary | ICD-10-CM | POA: Diagnosis present

## 2014-08-08 DIAGNOSIS — R279 Unspecified lack of coordination: Secondary | ICD-10-CM | POA: Diagnosis present

## 2014-08-08 DIAGNOSIS — R262 Difficulty in walking, not elsewhere classified: Secondary | ICD-10-CM | POA: Diagnosis not present

## 2014-08-08 DIAGNOSIS — M6281 Muscle weakness (generalized): Secondary | ICD-10-CM | POA: Diagnosis present

## 2014-08-08 NOTE — Therapy (Signed)
[place] MAIN [facility] SERVICES [address] [place], [place], [address] Phone: [phone]   Fax:  [phone]  Occupational Therapy Treatment  Patient Details  Name: Craig Dennis. MRN: [id number] Date of Birth: 04-03-1933 Referring Provider:  Vladimir Crofts, MD  Encounter Date: 08/08/2014      OT End of Session - 08/08/14 1600    Visit Number 15   Number of Visits 17   Date for OT Re-Evaluation 08/20/14   Authorization Type medicare G code 15   Authorization Time Period 17   OT Start Time 1003   OT Stop Time 1114   OT Time Calculation (min) 71 min   Equipment Utilized During Treatment rollator at times, revolving door at entry of hospital   Activity Tolerance Patient tolerated treatment well   Behavior During Therapy [facility] for tasks assessed/performed      Past Medical History  Diagnosis Date  . Parkinson's disease     Primarily right sided features  . Dyslipidemia   . History of chest pain   . Diabetes   . History of headache     chronic, recurrent, postconcussive, after fall from a collapsed deck, had been treated wtih prn fiorinal  . Hypertension   . Erectile dysfunction   . Colon polyps   . CAD (coronary artery disease)   . Plantar fasciitis   . Gait disorder   . Myocardial infarction   . Heart murmur   . Parkinson's disease   . BPH (benign prostatic hyperplasia)   . HOH (hard of hearing)   . Cancer     skin    Past Surgical History  Procedure Laterality Date  . Cardicac stent    . Hemorrhoid surgery    . Tonsillectomy and adenoidectomy    . Coronary angioplasty      stent  . Cataract extraction w/phaco Right 06/28/2014    Procedure: CATARACT EXTRACTION PHACO AND INTRAOCULAR LENS PLACEMENT (IOC);  Surgeon: Lyla Glassing, MD;  Location: [facility] ORS;  Service: Ophthalmology;  Laterality: Right;  [place]    1:07.4               AP     12.6             CDE   8.50    There were no vitals filed for this visit.  Visit  Diagnosis:  Difficulty walking  Muscle weakness (generalized)  Lack of coordination  Falls frequently      Subjective Assessment - 08/08/14 1556    Subjective  Wife reports patient had difficulty after therapy yesterday when leaving to get out of the revolving door and stated, "it took about 15 minutes to get him out and to the car" despite working on this skill just prior to his departure. Patient reports he is doing good today and feels he is not freezing much.   Patient is accompained by: Family member   Limitations disease progression, freezing of gait behaviors, fall risk   Special Tests 6 minute walk test, 5 times sit to stand, BERG balance test   Patient Stated Goals Patient reports he would like to be as independent as possible, he wants to get back to going to the gym and walking on the treadmill and biking.  He wants his legs to be stronger, his balance better and safe with walking.  He would love to play golf again.     Currently in Pain? No/denies   Multiple Pain Sites No  OT Treatments/Exercises (OP) - 08/08/14 1607    ADLs   Overall ADLs Patient seen for functional component tasks of sit to stand, crossing right and left legs to prep for tying shoes, stairs, curb negotiation and turning in small spaces for 5 reps each activity, CGA as needed for turning behaviors and cues for moving BIG  Patient requires cues to keep leg crossed and be able to reach with hands to manage socks and shoes.   Neurological Re-education Exercises   Other Exercises 1 LSVT Daily Session Maximal Daily Exercises: Sustained movements are designed to rescale the amplitude of movement output for generalization to daily functional activities. Performed as follows for 1 set of 10 repetitions each: Multi directional sustained movements- 1) Floor to ceiling, 2) Side to side. Multi directional Repetitive movements performed in standing and are designed to provide retraining  effort needed for sustained muscle activation in tasks Performed as follows: 3) Step and reach forward, 4) Step and Reach Backwards, 5) Step and reach sideways, 6) Rock and reach forward/backward, 7) Rock and reach sideways. Sit to stand from mat table on lowest setting with cues for weight shift, technique and CGA for 5 reps for 2 sets. Patient progressed to lower soft chairs in the hospital lobby and was able to complete with CGA this date for 5 reps this date. Patient seen for functional mobility tasks this date with emphasis on gait speed, length of steps with short distance ambulation, 200 feet for 2 trials without assistive device this date. Focus remained this date on multiple trials of managing gait with revolving door (especially since he had increased difficulty this date just getting into the clinic) in and out of the hospital as well as on and off the elevator. Patient requires cues for timing of initiation of gait in both situations and tends to freeze more when he sees the doors moving or when people are behind him. Patient requires cues for BIG movements and cadence.                 OT Education - 08/08/14 1558    Education provided Yes   Education Details Continued focus on education regarding LSVT Daily Session Maximal Daily Exercises: Sustained movements are designed to rescale the amplitude of movement output for generalization to daily functional activities. Performed as follows for 1 set of 10 repetitions each: Multi directional sustained movements- 1) Floor to ceiling, 2) Side to side. Multi directional Repetitive movements performed in standing and are designed to provide retraining effort needed for sustained muscle activation in tasks Performed as follows: 3) Step and reach forward, 4) Step and Reach Backwards, 5) Step and reach sideways, 6) Rock and reach forward/backward, 7) Rock and reach sideways. Sit to stand from mat table on lowest setting with cues for weight shift,  technique and CGA for 5 reps for 2 sets. Patient seen for functional mobility tasks this date with emphasis on gait speed, length of steps with short distance ambulation, 300 feet for 4 trials. Patient requires cues for BIG movements and cadence   Person(s) Educated Patient;Spouse   Methods Explanation;Demonstration;Verbal cues   Comprehension Verbalized understanding;Returned demonstration;Verbal cues required             OT Long Term Goals - 07/30/14 1630    OT LONG TERM GOAL #1   Title Patient will decrease frequency of freezing episodes with score of 11 or less on Freezing of Gait Questionnaire in 4 weeks.   Baseline significant freezing behaviors, score  of 15 on Freezing of Gait Questionairre, performance can flucuate on a daily basis.   Time 4   Period Weeks   Status On-going   OT LONG TERM GOAL #2   Title Patient will improve gait speed and endurance and be able to walk 1500 feet in 6 minutes to negotiate around the home and community safely in 4 weeks.   Baseline Patient now limits his community activities, avoids crowds if possible. Ambulating 1340 feet at evaluation, 1350 this date but with improved quality of steps, amplitude of gait and a reduction in freezing behaviors.   Time 4   Status On-going   OT LONG TERM GOAL #3   Title Patient will complete HEP for maximal daily exercises with modified independence in 4 weeks.   Baseline still requires cues with exercises and CGA for exercises in standing for balance.   Time 4   Period Weeks   Status On-going   OT LONG TERM GOAL #4   Title Patient will transfer from sit to stand without the use of arms safely and independently from a variety of chairs/surfaces in 4 weeks.    Baseline Patient able to complete now from normal chair height with cues, will progress to lower height chairs and unstable surfaces.   Time 4   Period Weeks   Status Partially Met   OT LONG TERM GOAL #5   Title Patient will demonstrate the ability to  navigate through a crowd in a restaurant with minimal freezing of gait and no falls with cues if needed.   Baseline significant freezing behaviors now in restaurants and crowds which continues but can flucuate daily.   Time 4   Period Weeks   Status On-going               Plan - 08/08/14 1601    Clinical Impression Statement Patient and wife asking if it might be beneficial to continue for a short while past the 16 visits.  Discussed this at length and the potential benefit from focusing more on the freezing of gait issues in particular.  Will reassess patient on Friday and perform outcome measures to see progress.  He does continue to have some bad days with balance and freezing of gait and would likely benefit from continued therapy to help extinguish those behaviors and continue with education for patient and wife on management of these issues on the days he has more difficulty.   Pt will benefit from skilled therapeutic intervention in order to improve on the following deficits (Retired) Abnormal gait;Decreased strength;Decreased knowledge of use of DME;Difficulty walking;Decreased mobility;Impaired UE functional use;Decreased balance;Decreased coordination;Decreased endurance   Rehab Potential Good   Clinical Impairments Affecting Rehab Potential freezing of gait   OT Frequency 4x / week   OT Duration 4 weeks   OT Treatment/Interventions Self-care/ADL training;Gait Training;Functional Mobility Training;Therapeutic exercises;Neuromuscular education;Patient/family education;DME and/or AE instruction;Balance training   OT Home Exercise Plan Patient is required to perform daily homework/exercise tasks as a part of the program.   Consulted and Agree with Plan of Care Patient;Family member/caregiver   Family Member Consulted wife        Problem List Patient Active Problem List   Diagnosis Date Noted  . Skin erythema 06/05/2013  . Aortic stenosis 05/08/2013  . CAD S/P percutaneous  coronary angioplasty 05/03/2013  . HTN (hypertension) 05/03/2013  . Type 2 diabetes mellitus with vascular disease 05/03/2013  . HLD (hyperlipidemia) 05/03/2013  . Parkinson disease 05/03/2013  . Chronic headache 05/03/2013  .  Abnormality of gait 03/21/2012  . Paralysis agitans 03/21/2012   Achilles Dunk, OTR/L, CLT Carita Sollars 08/08/2014, 4:10 PM  [place] MAIN [facility] SERVICES [address] [place], [place], [address] Phone: [phone]   Fax:  [phone]

## 2014-08-09 ENCOUNTER — Ambulatory Visit: Payer: Medicare Other | Admitting: Occupational Therapy

## 2014-08-09 ENCOUNTER — Encounter: Payer: Self-pay | Admitting: Occupational Therapy

## 2014-08-09 DIAGNOSIS — M6281 Muscle weakness (generalized): Secondary | ICD-10-CM

## 2014-08-09 DIAGNOSIS — R262 Difficulty in walking, not elsewhere classified: Secondary | ICD-10-CM | POA: Diagnosis not present

## 2014-08-09 DIAGNOSIS — R279 Unspecified lack of coordination: Secondary | ICD-10-CM

## 2014-08-09 DIAGNOSIS — R296 Repeated falls: Secondary | ICD-10-CM

## 2014-08-09 NOTE — Therapy (Signed)
[place] MAIN [facility] SERVICES [address] [place], [place], [address] Phone: [phone]   Fax:  [phone]  Occupational Therapy Treatment  Patient Details  Name: Craig Dennis. MRN: [id number] Date of Birth: 1933/02/14 Referring Provider:  Vladimir Crofts, MD  Encounter Date: 08/09/2014      OT End of Session - 08/09/14 1625    Visit Number 16   Number of Visits 17   Date for OT Re-Evaluation 08/20/14   Authorization Type medicare G code 16   Authorization Time Period 40   OT Start Time 1000   OT Stop Time 1102   OT Time Calculation (min) 62 min   Equipment Utilized During Treatment rollator at times   Activity Tolerance Patient tolerated treatment well   Behavior During Therapy [facility] for tasks assessed/performed      Past Medical History  Diagnosis Date  . Parkinson's disease     Primarily right sided features  . Dyslipidemia   . History of chest pain   . Diabetes   . History of headache     chronic, recurrent, postconcussive, after fall from a collapsed deck, had been treated wtih prn fiorinal  . Hypertension   . Erectile dysfunction   . Colon polyps   . CAD (coronary artery disease)   . Plantar fasciitis   . Gait disorder   . Myocardial infarction   . Heart murmur   . Parkinson's disease   . BPH (benign prostatic hyperplasia)   . HOH (hard of hearing)   . Cancer     skin    Past Surgical History  Procedure Laterality Date  . Cardicac stent    . Hemorrhoid surgery    . Tonsillectomy and adenoidectomy    . Coronary angioplasty      stent  . Cataract extraction w/phaco Right 06/28/2014    Procedure: CATARACT EXTRACTION PHACO AND INTRAOCULAR LENS PLACEMENT (IOC);  Surgeon: Lyla Glassing, MD;  Location: [facility] ORS;  Service: Ophthalmology;  Laterality: Right;  [place]    1:07.4               AP     12.6             CDE   8.50    There were no vitals filed for this visit.  Visit Diagnosis:  Difficulty  walking  Muscle weakness (generalized)  Lack of coordination  Falls frequently      Subjective Assessment - 08/09/14 1622    Subjective  Patient and wife report patient did forget to take his morning medication yesterday and did not realize it until late in the evening yesterday which may be why he had increased freezing of gait at times.  Reports he is doing well today and feeling good.     Patient is accompained by: Family member   Limitations disease progression, freezing of gait behaviors, fall risk   Patient Stated Goals Patient reports he would like to be as independent as possible, he wants to get back to going to the gym and walking on the treadmill and biking.  He wants his legs to be stronger, his balance better and safe with walking.  He would love to play golf again.     Currently in Pain? No/denies   Multiple Pain Sites No                      OT Treatments/Exercises (OP) - 08/09/14 1629  ADLs   Overall ADLs Continued focus on functional component tasks:  Patient seen for functional component tasks of sit to stand, crossing right and left legs to prep for tying shoes, stairs, curb negotiation and turning in small spaces for 5 reps each activity, CGA as needed for turning behaviors and cues for moving BIG Patient requires cues to keep leg crossed and be able to reach with hands to manage socks and shoes.   Neurological Re-education Exercises   Other Exercises 1 LSVT Daily Session Maximal Daily Exercises: Sustained movements are designed to rescale the amplitude of movement output for generalization to daily functional activities. Performed as follows for 1 set of 10 repetitions each: Multi directional sustained movements- 1) Floor to ceiling, 2) Side to side. Multi directional Repetitive movements performed in standing and are designed to provide retraining effort needed for sustained muscle activation in tasks Performed as follows: 3) Step and reach forward, 4)  Step and Reach Backwards, 5) Step and reach sideways, 6) Rock and reach forward/backward, 7) Rock and reach sideways. Sit to stand from mat table on lowest setting with cues for weight shift, technique and CGA for 5 reps for 2 sets. Patient progressed to lower soft chairs in the hospital lobby and was able to complete with CGA this date for 5 reps this date. Patient seen for functional mobility tasks this date with emphasis on gait speed, length of steps with short distance ambulation, 200 feet for 2 trials without assistive device this date. Focus remained this date on multiple trials of managing gait with revolving door (especially since he had increased difficulty this date just getting into the clinic) in and out of the hospital as well as on and off the elevator. Patient requires cues for timing of initiation of gait in both situations and tends to freeze more when he sees the doors moving or when people are behind him, worked more this date on managing in crowded environements. Patient requires cues for BIG movements and cadence                OT Education - 08/09/14 1623    Education provided Yes   Education Details Specifically technique for rock and reach sideways to slow down, twist from the trunk and reach forward.  Continued education to patient and wife on freezing of gait issues and potential ways to help diminish these behaviors.    Person(s) Educated Patient;Spouse   Methods Explanation;Demonstration;Verbal cues;Tactile cues   Comprehension Verbal cues required;Returned demonstration;Verbalized understanding;Tactile cues required             OT Long Term Goals - 07/30/14 1630    OT LONG TERM GOAL #1   Title Patient will decrease frequency of freezing episodes with score of 11 or less on Freezing of Gait Questionnaire in 4 weeks.   Baseline significant freezing behaviors, score of 15 on Freezing of Gait Questionairre, performance can flucuate on a daily basis.   Time 4    Period Weeks   Status On-going   OT LONG TERM GOAL #2   Title Patient will improve gait speed and endurance and be able to walk 1500 feet in 6 minutes to negotiate around the home and community safely in 4 weeks.   Baseline Patient now limits his community activities, avoids crowds if possible. Ambulating 1340 feet at evaluation, 1350 this date but with improved quality of steps, amplitude of gait and a reduction in freezing behaviors.   Time 4   Status On-going  OT LONG TERM GOAL #3   Title Patient will complete HEP for maximal daily exercises with modified independence in 4 weeks.   Baseline still requires cues with exercises and CGA for exercises in standing for balance.   Time 4   Period Weeks   Status On-going   OT LONG TERM GOAL #4   Title Patient will transfer from sit to stand without the use of arms safely and independently from a variety of chairs/surfaces in 4 weeks.    Baseline Patient able to complete now from normal chair height with cues, will progress to lower height chairs and unstable surfaces.   Time 4   Period Weeks   Status Partially Met   OT LONG TERM GOAL #5   Title Patient will demonstrate the ability to navigate through a crowd in a restaurant with minimal freezing of gait and no falls with cues if needed.   Baseline significant freezing behaviors now in restaurants and crowds which continues but can flucuate daily.   Time 4   Period Weeks   Status On-going               Plan - 08/09/14 1625    Clinical Impression Statement Patient demonstrated a smoother quality of gait pattern this date, decreased freezing of gait behaviors limited to a few turns.  Implemented stop and go patterns to help with the initiation and termination of activities and diminish the freezing behaviors with this task.  His turns are improved at the top of the stairs but demos some difficulty when turning at the bottom of the stairs,often turning his trunk and not his feet.  Will  plan to reassess patient next session with outcome measures and update goals.   Pt will benefit from skilled therapeutic intervention in order to improve on the following deficits (Retired) Abnormal gait;Decreased strength;Decreased knowledge of use of DME;Difficulty walking;Decreased mobility;Impaired UE functional use;Decreased balance;Decreased coordination;Decreased endurance   Rehab Potential Good   Clinical Impairments Affecting Rehab Potential freezing of gait   OT Frequency 4x / week   OT Duration 4 weeks   OT Treatment/Interventions Self-care/ADL training;Gait Training;Functional Mobility Training;Therapeutic exercises;Patient/family education;DME and/or AE instruction   OT Home Exercise Plan Patient is required to perform daily homework/exercise tasks as a part of the program.   Consulted and Agree with Plan of Care Patient;Family member/caregiver        Problem List Patient Active Problem List   Diagnosis Date Noted  . Skin erythema 06/05/2013  . Aortic stenosis 05/08/2013  . CAD S/P percutaneous coronary angioplasty 05/03/2013  . HTN (hypertension) 05/03/2013  . Type 2 diabetes mellitus with vascular disease 05/03/2013  . HLD (hyperlipidemia) 05/03/2013  . Parkinson disease 05/03/2013  . Chronic headache 05/03/2013  . Abnormality of gait 03/21/2012  . Paralysis agitans 03/21/2012   Achilles Dunk, OTR/L, CLT Lovett,Amy 08/09/2014, 4:33 PM  [place] [facility] MAIN [facility] SERVICES [address] [place], [place], [address] Phone: [phone]   Fax:  [phone]

## 2014-08-10 ENCOUNTER — Encounter: Payer: Self-pay | Admitting: Occupational Therapy

## 2014-08-10 ENCOUNTER — Ambulatory Visit: Payer: Medicare Other | Admitting: Occupational Therapy

## 2014-08-10 ENCOUNTER — Encounter: Payer: Medicare Other | Admitting: Occupational Therapy

## 2014-08-10 DIAGNOSIS — R296 Repeated falls: Secondary | ICD-10-CM

## 2014-08-10 DIAGNOSIS — R279 Unspecified lack of coordination: Secondary | ICD-10-CM

## 2014-08-10 DIAGNOSIS — M6281 Muscle weakness (generalized): Secondary | ICD-10-CM

## 2014-08-10 DIAGNOSIS — R262 Difficulty in walking, not elsewhere classified: Secondary | ICD-10-CM

## 2014-08-11 NOTE — Therapy (Signed)
[place] MAIN [facility] SERVICES [address] [place], [place], [address] Phone: [phone]   Fax:  [phone]  Occupational Therapy Treatment  Patient Details  Name: Craig Dennis. MRN: [id number] Date of Birth: Dec 23, 1933 Referring Provider:  Vladimir Crofts, MD  Encounter Date: 08/10/2014      OT End of Session - 08/10/14 1943    Visit Number 17   Number of Visits 17   Date for OT Re-Evaluation 08/20/14   Authorization Type medicare G code 17   Authorization Time Period 93   OT Start Time 1000   OT Stop Time 1104   OT Time Calculation (min) 64 min   Equipment Utilized During Treatment rollator at times   Activity Tolerance Patient tolerated treatment well   Behavior During Therapy [facility] for tasks assessed/performed      Past Medical History  Diagnosis Date  . Parkinson's disease     Primarily right sided features  . Dyslipidemia   . History of chest pain   . Diabetes   . History of headache     chronic, recurrent, postconcussive, after fall from a collapsed deck, had been treated wtih prn fiorinal  . Hypertension   . Erectile dysfunction   . Colon polyps   . CAD (coronary artery disease)   . Plantar fasciitis   . Gait disorder   . Myocardial infarction   . Heart murmur   . Parkinson's disease   . BPH (benign prostatic hyperplasia)   . HOH (hard of hearing)   . Cancer     skin    Past Surgical History  Procedure Laterality Date  . Cardicac stent    . Hemorrhoid surgery    . Tonsillectomy and adenoidectomy    . Coronary angioplasty      stent  . Cataract extraction w/phaco Right 06/28/2014    Procedure: CATARACT EXTRACTION PHACO AND INTRAOCULAR LENS PLACEMENT (IOC);  Surgeon: Lyla Glassing, MD;  Location: [facility] ORS;  Service: Ophthalmology;  Laterality: Right;  [place]    1:07.4               AP     12.6             CDE   8.50    There were no vitals filed for this visit.  Visit Diagnosis:  Difficulty walking - Plan:  Ot plan of care cert/re-cert  Muscle weakness (generalized) - Plan: Ot plan of care cert/re-cert  Lack of coordination - Plan: Ot plan of care cert/re-cert  Falls frequently - Plan: Ot plan of care cert/re-cert      Subjective Assessment - 08/10/14 1939    Subjective  Patient has not had any reported falls since participating in the [place] program, he has had a few near misses with assistance to recover and remains at risk for falls.    Patient is accompained by: Family member   Limitations disease progression, freezing of gait behaviors, fall risk   Special Tests 6 minute walk test, 5 times sit to stand, BERG balance test   Patient Stated Goals Patient reports he would like to be as independent as possible, he wants to get back to going to the gym and walking on the treadmill and biking.  He wants his legs to be stronger, his balance better and safe with walking.  He would love to play golf again.     Currently in Pain? No/denies   Multiple Pain Sites No  OT Treatments/Exercises (OP) - 08/10/14 1941    ADLs   Overall ADLs Functional component tasks addressed this date as outlined in previous sessions for sit to stand, crossing right and left legs to prep for tying shoes, stairs, curb negotiation and turning in small spaces for 5 reps each activity.  Discussed the potential benefit from continued focus on select activities especially in regards to freezing of gait behaviors to reduce the risk for falls.    Neurological Re-education Exercises   Other Exercises 1 LSVT Daily Session Maximal Daily Exercises: Sustained movements are designed to rescale the amplitude of movement output for generalization to daily functional activities. Performed as follows for 1 set of 10 repetitions each: Multi directional sustained movements- 1) Floor to ceiling, 2) Side to side. Multi directional Repetitive movements performed in standing and are designed to provide  retraining effort needed for sustained muscle activation in tasks Performed as follows: 3) Step and reach forward, 4) Step and Reach Backwards, 5) Step and reach sideways, 6) Rock and reach forward/backward, 7) Rock and reach sideways. Sit to stand from mat table on lowest setting with cues for weight shift, technique and CGA for 5 reps for 2 sets.  6 minute walk test, 5 times sit to stand, freezing of gait questionaire                OT Education - 08/10/14 1942    Education provided Yes   Education Details HEP, functional component tasks, walking at the gym with the potential use of the treadmill, progress to date, goal update.   Person(s) Educated Patient;Spouse   Methods Explanation;Demonstration;Tactile cues;Verbal cues   Comprehension Verbal cues required;Returned demonstration;Verbalized understanding;Tactile cues required             OT Long Term Goals - 08/10/14 1944    OT LONG TERM GOAL #1   Title Patient will decrease frequency of freezing episodes with score of 11 or less on Freezing of Gait Questionnaire in 4 weeks.   Time 4   Period Weeks   Status On-going   OT LONG TERM GOAL #2   Title Patient will improve gait speed and endurance and be able to walk 1500 feet in 6 minutes to negotiate around the home and community safely in 4 weeks.   Baseline Patient now limits his community activities, avoids crowds if possible. Ambulating 1340 feet at evaluation, 1350 this date but with improved quality of steps, amplitude of gait and a reduction in freezing behaviors.   Time 4   Period Weeks   Status Partially Met   OT LONG TERM GOAL #3   Title Patient will complete HEP for maximal daily exercises with modified independence in 4 weeks.   Baseline occasional cues required and CGA with exercises in standing or modified with use of chair   Time 4   Period Weeks   Status Partially Met   OT LONG TERM GOAL #4   Title Patient will transfer from sit to stand without the use of  arms safely and independently from a variety of chairs/surfaces in 4 weeks.    Time 4   Period Weeks   Status Achieved   OT LONG TERM GOAL #5   Title Patient will demonstrate the ability to navigate through a crowd in a restaurant with minimal freezing of gait and no falls with cues if needed.   Baseline continues to demonstrate these behaviors significant freezing behaviors now in restaurants and crowds which continues but can flucuate daily.  Time 4   Period Weeks   Status Not Met   OT LONG TERM GOAL #6   Title Patient will navigate safely through revolving doors with rollator, verbal cues and supervision.   Baseline significant freezing of gait at times.   Time 4   Period Weeks   Status New   OT LONG TERM GOAL #7   Title Patient will demonstrate turning in small spaces safely with verbal cues and close supervision.   Baseline difficulty with turning and freezing behaviors which increase fall risk .   Time 4   Period Weeks   Status New   OT LONG TERM GOAL #8   Title Patient will demonstrate the ability to cross over a variety of thresholds with minimal hesistation 80% of the time, with minimal cues.   Baseline occurs 90% or greater of the time.   Time 4   Period Weeks   Status New               Plan - 08/10/14 1944    Clinical Impression Statement Patient completed the intensive portion of LSVT BIG with 16 treatment visits.  He has progressed well, he has not had any falls since starting the program, a couple of near misses which he was able to either self correct or his wife was able to demostrate the ability to anticipate and assist patient to avoid falls.  He came to therapy this date wtih 2 different tennis shoes on which likely affected his gait patterns for outcome measures. His 6 minute walk test was 1350 feet, 5 times sit to stand was 11.7 sec.  Freezing of gait was 13.  He is able to demonstrate his HEP with minimal verbal cues for technique and occasionally  requires modification for balance.  His wife attended each session and has been educated on how much assistance to provide for exercises at home.  He continues to have significant freezing of gait behaviors which improved during therapy however, he would benefit from continued OT 2x a week to focus primarily on crossing thresholds, entering and exiting crowded elevators, managing to ambulate through a revolving door and entering crowded restaurants using techniques to dimnish freezing of gait behaviors and therefore decrease patient's fall risk.  His goals were updated to reflect progress and additional goals added.  Patient and wife agree with plan.     Pt will benefit from skilled therapeutic intervention in order to improve on the following deficits (Retired) Abnormal gait;Decreased strength;Decreased knowledge of use of DME;Difficulty walking;Decreased mobility;Impaired UE functional use;Decreased balance;Decreased coordination;Decreased endurance   Rehab Potential Good   Clinical Impairments Affecting Rehab Potential freezing of gait   OT Frequency 2x / week   OT Duration 6 weeks   OT Treatment/Interventions Self-care/ADL training;Gait Training;Functional Mobility Training;Therapeutic exercises;Patient/family education;DME and/or AE instruction   OT Home Exercise Plan Patient is required to perform daily homework/exercise tasks as a part of the program. Although the intensive portion of the program has been completed, therapist recommends patient continue with exercises 2 times a day.   Consulted and Agree with Plan of Care Patient;Family member/caregiver   Family Member Consulted wife        Problem List Patient Active Problem List   Diagnosis Date Noted  . Skin erythema 06/05/2013  . Aortic stenosis 05/08/2013  . CAD S/P percutaneous coronary angioplasty 05/03/2013  . HTN (hypertension) 05/03/2013  . Type 2 diabetes mellitus with vascular disease 05/03/2013  . HLD (hyperlipidemia)  05/03/2013  . Parkinson disease  05/03/2013  . Chronic headache 05/03/2013  . Abnormality of gait 03/21/2012  . Paralysis agitans 03/21/2012   Achilles Dunk, OTR/L, CLT Braylee Bosher 08/11/2014, 1:57 PM  [place] MAIN [facility] SERVICES [address] [place], [place], [address] Phone: [phone]   Fax:  [phone]

## 2014-08-13 ENCOUNTER — Ambulatory Visit: Payer: Medicare Other | Admitting: Occupational Therapy

## 2014-08-14 ENCOUNTER — Ambulatory Visit: Payer: Medicare Other | Admitting: Occupational Therapy

## 2014-08-14 ENCOUNTER — Encounter: Payer: Self-pay | Admitting: Occupational Therapy

## 2014-08-14 DIAGNOSIS — R296 Repeated falls: Secondary | ICD-10-CM

## 2014-08-14 DIAGNOSIS — M6281 Muscle weakness (generalized): Secondary | ICD-10-CM

## 2014-08-14 DIAGNOSIS — R279 Unspecified lack of coordination: Secondary | ICD-10-CM

## 2014-08-14 DIAGNOSIS — R262 Difficulty in walking, not elsewhere classified: Secondary | ICD-10-CM | POA: Diagnosis not present

## 2014-08-15 NOTE — Therapy (Signed)
[place] MAIN [facility] SERVICES [address] [place], [place], [address] Phone: [phone]   Fax:  [phone]  Occupational Therapy Treatment  Patient Details  Name: Craig Dennis. MRN: [id number] Date of Birth: 1933/12/15 Referring Provider:  Idelle Crouch, MD  Encounter Date: 08/14/2014      OT End of Session - 08/14/14 2144    Visit Number 18   Number of Visits 29   Date for OT Re-Evaluation 09/14/14   Authorization Type medicare G code 18   Authorization Time Period 20   OT Start Time 1000   OT Stop Time 1100   OT Time Calculation (min) 60 min   Equipment Utilized During Treatment rollator at times   Activity Tolerance Patient tolerated treatment well   Behavior During Therapy [facility] for tasks assessed/performed      Past Medical History  Diagnosis Date  . Parkinson's disease     Primarily right sided features  . Dyslipidemia   . History of chest pain   . Diabetes   . History of headache     chronic, recurrent, postconcussive, after fall from a collapsed deck, had been treated wtih prn fiorinal  . Hypertension   . Erectile dysfunction   . Colon polyps   . CAD (coronary artery disease)   . Plantar fasciitis   . Gait disorder   . Myocardial infarction   . Heart murmur   . Parkinson's disease   . BPH (benign prostatic hyperplasia)   . HOH (hard of hearing)   . Cancer     skin    Past Surgical History  Procedure Laterality Date  . Cardicac stent    . Hemorrhoid surgery    . Tonsillectomy and adenoidectomy    . Coronary angioplasty      stent  . Cataract extraction w/phaco Right 06/28/2014    Procedure: CATARACT EXTRACTION PHACO AND INTRAOCULAR LENS PLACEMENT (IOC);  Surgeon: Lyla Glassing, MD;  Location: [facility] ORS;  Service: Ophthalmology;  Laterality: Right;  [place]    1:07.4               AP     12.6             CDE   8.50    There were no vitals filed for this visit.  Visit Diagnosis:  Difficulty  walking  Muscle weakness (generalized)  Lack of coordination  Falls frequently      Subjective Assessment - 08/14/14 2142    Subjective  Wife reports patient had a near fall this weekend at church but she was able to help him recover.  Patient reports he has been performing exercises at home with wife present.     Patient is accompained by: Family member   Limitations disease progression, freezing of gait behaviors, fall risk   Patient Stated Goals Patient reports he would like to be as independent as possible, he wants to get back to going to the gym and walking on the treadmill and biking.  He wants his legs to be stronger, his balance better and safe with walking.  He would love to play golf again.     Currently in Pain? No/denies   Multiple Pain Sites No                      OT Treatments/Exercises (OP) - 08/14/14 2142    Neurological Re-education Exercises   Other Exercises 1 LSVT Daily Session Maximal Daily Exercises:  Sustained movements are designed to rescale the amplitude of movement output for generalization to daily functional activities. Performed as follows for 1 set of 10 repetitions each: Multi directional sustained movements- 1) Floor to ceiling, 2) Side to side. Multi directional Repetitive movements performed in standing and are designed to provide retraining effort needed for sustained muscle activation in tasks Performed as follows: 3) Step and reach forward, 4) Step and Reach Backwards, 5) Step and reach sideways, 6) Rock and reach forward/backward, 7) Rock and reach sideways. Sit to stand from mat table on lowest setting with cues for weight shift, technique and CGA for 5 reps    Other Exercises 2 Patient seen for focus on freezing of gait patterns this date with a variety of techniques used such as visualization of antipated movement in situations such as crossing the thresholds, getting onto the elevator and moving in a crowd.  Patient seen for high  repetitions of crossing thresholds with use of rollator in more quiet area and with plans to progress to more congested areas. Patient tends to freeze with weight shift towards toes, pushing rollator far forwards and requires cues to stop, shift weight back and step big.  He was also seen for obstacle course management with items strategically placed in his path and cues for use of rollator, multiple trials completed and performance improved with repetition.                  OT Education - 08/14/14 2143    Education provided Yes   Education Details Freezing of gait patterns, visualization, repetition to help diminish these behaviors.    Person(s) Educated Patient;Spouse   Methods Explanation;Demonstration;Tactile cues;Verbal cues   Comprehension Verbal cues required;Returned demonstration;Verbalized understanding;Tactile cues required             OT Long Term Goals - 08/10/14 1944    OT LONG TERM GOAL #1   Title Patient will decrease frequency of freezing episodes with score of 11 or less on Freezing of Gait Questionnaire in 4 weeks.   Time 4   Period Weeks   Status On-going   OT LONG TERM GOAL #2   Title Patient will improve gait speed and endurance and be able to walk 1500 feet in 6 minutes to negotiate around the home and community safely in 4 weeks.   Baseline Patient now limits his community activities, avoids crowds if possible. Ambulating 1340 feet at evaluation, 1350 this date but with improved quality of steps, amplitude of gait and a reduction in freezing behaviors.   Time 4   Period Weeks   Status Partially Met   OT LONG TERM GOAL #3   Title Patient will complete HEP for maximal daily exercises with modified independence in 4 weeks.   Baseline occasional cues required and CGA with exercises in standing or modified with use of chair   Time 4   Period Weeks   Status Partially Met   OT LONG TERM GOAL #4   Title Patient will transfer from sit to stand without the  use of arms safely and independently from a variety of chairs/surfaces in 4 weeks.    Time 4   Period Weeks   Status Achieved   OT LONG TERM GOAL #5   Title Patient will demonstrate the ability to navigate through a crowd in a restaurant with minimal freezing of gait and no falls with cues if needed.   Baseline continues to demonstrate these behaviors significant freezing behaviors now in restaurants  and crowds which continues but can flucuate daily.   Time 4   Period Weeks   Status Not Met   OT LONG TERM GOAL #6   Title Patient will navigate safely through revolving doors with rollator, verbal cues and supervision.   Baseline significant freezing of gait at times.   Time 4   Period Weeks   Status New   OT LONG TERM GOAL #7   Title Patient will demonstrate turning in small spaces safely with verbal cues and close supervision.   Baseline difficulty with turning and freezing behaviors which increase fall risk .   Time 4   Period Weeks   Status New   OT LONG TERM GOAL #8   Title Patient will demonstrate the ability to cross over a variety of thresholds with minimal hesistation 80% of the time, with minimal cues.   Baseline occurs 90% or greater of the time.   Time 4   Period Weeks   Status New               Plan - 08/14/14 2146    Clinical Impression Statement Patient requires additional focus on freezing of gait behaviors along with extensive education to wife for assisting patient by providing cues and/or assist.  Patient continues to demo freezing behaviors, responds well to cues and will need to perform in high reps to help try to diminish/extinguish the behavior.  It appears some of the freezing may be anticipated by patient which reinforces it.  Discussed the option of not using the revolving door at the hospital entrance since this is the only place he uses such a door.     Pt will benefit from skilled therapeutic intervention in order to improve on the following deficits  (Retired) Abnormal gait;Decreased strength;Decreased knowledge of use of DME;Difficulty walking;Decreased mobility;Impaired UE functional use;Decreased balance;Decreased coordination;Decreased endurance   Rehab Potential Good   Clinical Impairments Affecting Rehab Potential freezing of gait   OT Frequency 2x / week   OT Duration 6 weeks   OT Treatment/Interventions Self-care/ADL training;Gait Training;Functional Mobility Training;Therapeutic exercises;Patient/family education;DME and/or AE instruction   OT Home Exercise Plan Patient is required to perform daily homework/exercise tasks as a part of the program. Although the intensive portion of the program has been completed, therapist recommends patient continue with exercises 2 times a day.   Consulted and Agree with Plan of Care Patient;Family member/caregiver   Family Member Consulted wife        Problem List Patient Active Problem List   Diagnosis Date Noted  . Skin erythema 06/05/2013  . Aortic stenosis 05/08/2013  . CAD S/P percutaneous coronary angioplasty 05/03/2013  . HTN (hypertension) 05/03/2013  . Type 2 diabetes mellitus with vascular disease 05/03/2013  . HLD (hyperlipidemia) 05/03/2013  . Parkinson disease 05/03/2013  . Chronic headache 05/03/2013  . Abnormality of gait 03/21/2012  . Paralysis agitans 03/21/2012   Achilles Dunk, OTR/L, CLT Cashmere Dingley 08/15/2014, 9:36 PM  [place] MAIN [facility] SERVICES [address] [place], [place], [address] Phone: [phone]   Fax:  [phone]

## 2014-08-16 ENCOUNTER — Ambulatory Visit: Payer: Medicare Other | Admitting: Occupational Therapy

## 2014-08-16 ENCOUNTER — Encounter: Payer: Self-pay | Admitting: Occupational Therapy

## 2014-08-16 DIAGNOSIS — R262 Difficulty in walking, not elsewhere classified: Secondary | ICD-10-CM | POA: Diagnosis not present

## 2014-08-16 DIAGNOSIS — R296 Repeated falls: Secondary | ICD-10-CM

## 2014-08-16 DIAGNOSIS — R279 Unspecified lack of coordination: Secondary | ICD-10-CM

## 2014-08-16 DIAGNOSIS — M6281 Muscle weakness (generalized): Secondary | ICD-10-CM

## 2014-08-17 NOTE — Therapy (Signed)
[place] MAIN [facility] SERVICES [address] [place], [place], [address] Phone: [phone]   Fax:  [phone]  Occupational Therapy Treatment  Patient Details  Name: Craig Dennis. MRN: [id number] Date of Birth: 03-21-1933 Referring Provider:  Idelle Crouch, MD  Encounter Date: 08/16/2014      OT End of Session - 08/16/14 2151    Visit Number 19   Number of Visits 29   Date for OT Re-Evaluation 09/14/14   Authorization Type medicare G code 19   Authorization Time Period 20   OT Start Time 1101   OT Stop Time 1202   OT Time Calculation (min) 61 min      Past Medical History  Diagnosis Date  . Parkinson's disease     Primarily right sided features  . Dyslipidemia   . History of chest pain   . Diabetes   . History of headache     chronic, recurrent, postconcussive, after fall from a collapsed deck, had been treated wtih prn fiorinal  . Hypertension   . Erectile dysfunction   . Colon polyps   . CAD (coronary artery disease)   . Plantar fasciitis   . Gait disorder   . Myocardial infarction   . Heart murmur   . Parkinson's disease   . BPH (benign prostatic hyperplasia)   . HOH (hard of hearing)   . Cancer     skin    Past Surgical History  Procedure Laterality Date  . Cardicac stent    . Hemorrhoid surgery    . Tonsillectomy and adenoidectomy    . Coronary angioplasty      stent  . Cataract extraction w/phaco Right 06/28/2014    Procedure: CATARACT EXTRACTION PHACO AND INTRAOCULAR LENS PLACEMENT (IOC);  Surgeon: Lyla Glassing, MD;  Location: [facility] ORS;  Service: Ophthalmology;  Laterality: Right;  [place]    1:07.4               AP     12.6             CDE   8.50    There were no vitals filed for this visit.  Visit Diagnosis:  Difficulty walking  Muscle weakness (generalized)  Lack of coordination  Falls frequently      Subjective Assessment - 08/16/14 2150    Subjective  Patient reports he is doing well,  has a funeral to go to this afternoon.  No complaints.  Reports he did have a hard time getting through the doors leaving the other day and was wondering if therapist was watching.    Patient is accompained by: Family member   Limitations disease progression, freezing of gait behaviors, fall risk   Patient Stated Goals Patient reports he would like to be as independent as possible, he wants to get back to going to the gym and walking on the treadmill and biking.  He wants his legs to be stronger, his balance better and safe with walking.  He would love to play golf again.     Currently in Pain? No/denies   Multiple Pain Sites No                      OT Treatments/Exercises (OP) - 08/16/14 2151    Neurological Re-education Exercises   Other Exercises 1 LSVT Daily Session Maximal Daily Exercises: Sustained movements are designed to rescale the amplitude of movement output for generalization to daily functional activities. Performed as  follows for 1 set of 10 repetitions each: Multi directional sustained movements- 1) Floor to ceiling, 2) Side to side. Multi directional Repetitive movements performed in standing and are designed to provide retraining effort needed for sustained muscle activation in tasks Performed as follows: 3) Step and reach forward, 4) Step and Reach Backwards, 5) Step and reach sideways, 6) Rock and reach forward/backward, 7) Rock and reach sideways. Sit to stand from mat table on lowest setting with cues for weight shift,performed 5 reps each this date prior to additional tasks.    Other Exercises 2 Patient seen for focus on freezing of gait patterns this date with a variety of techniques used such as visualization of antipated movement in situations such as crossing the thresholds, getting onto the elevator and moving in a crowd. Patient seen for high repetitions of crossing thresholds with use of rollator in more quiet area and with plans to progress to more congested  areas. Patient tends to freeze with weight shift towards toes, pushing rollator far forwards and requires cues to stop, shift weight back and step big. He was also seen for obstacle course management with items strategically placed in his path and cues for use of rollator, multiple trials completed and performance improved with repetition.   Progressed to elevator, crossing threshold with cues and CGA for multiple reps with and without crowds.                 OT Education - 08/16/14 2151    Education provided Yes   Person(s) Educated Patient;Spouse   Methods Explanation;Verbal cues;Demonstration   Comprehension Verbalized understanding;Returned demonstration;Verbal cues required             OT Long Term Goals - 08/10/14 1944    OT LONG TERM GOAL #1   Title Patient will decrease frequency of freezing episodes with score of 11 or less on Freezing of Gait Questionnaire in 4 weeks.   Time 4   Period Weeks   Status On-going   OT LONG TERM GOAL #2   Title Patient will improve gait speed and endurance and be able to walk 1500 feet in 6 minutes to negotiate around the home and community safely in 4 weeks.   Baseline Patient now limits his community activities, avoids crowds if possible. Ambulating 1340 feet at evaluation, 1350 this date but with improved quality of steps, amplitude of gait and a reduction in freezing behaviors.   Time 4   Period Weeks   Status Partially Met   OT LONG TERM GOAL #3   Title Patient will complete HEP for maximal daily exercises with modified independence in 4 weeks.   Baseline occasional cues required and CGA with exercises in standing or modified with use of chair   Time 4   Period Weeks   Status Partially Met   OT LONG TERM GOAL #4   Title Patient will transfer from sit to stand without the use of arms safely and independently from a variety of chairs/surfaces in 4 weeks.    Time 4   Period Weeks   Status Achieved   OT LONG TERM GOAL #5   Title  Patient will demonstrate the ability to navigate through a crowd in a restaurant with minimal freezing of gait and no falls with cues if needed.   Baseline continues to demonstrate these behaviors significant freezing behaviors now in restaurants and crowds which continues but can flucuate daily.   Time 4   Period Weeks   Status Not  Met   OT LONG TERM GOAL #6   Title Patient will navigate safely through revolving doors with rollator, verbal cues and supervision.   Baseline significant freezing of gait at times.   Time 4   Period Weeks   Status New   OT LONG TERM GOAL #7   Title Patient will demonstrate turning in small spaces safely with verbal cues and close supervision.   Baseline difficulty with turning and freezing behaviors which increase fall risk .   Time 4   Period Weeks   Status New   OT LONG TERM GOAL #8   Title Patient will demonstrate the ability to cross over a variety of thresholds with minimal hesistation 80% of the time, with minimal cues.   Baseline occurs 90% or greater of the time.   Time 4   Period Weeks   Status New               Plan - 08/16/14 2152    Clinical Impression Statement Continued focus on freezing of gait issues in a variety of contexts.  Patient still tends to show a high rate of freezing with crossing thresholds, crowded spaces and with turning.  He shows improvement after repeated trials but needs to continue with high repetition trials to improve skill.     Pt will benefit from skilled therapeutic intervention in order to improve on the following deficits (Retired) Abnormal gait;Decreased strength;Decreased knowledge of use of DME;Difficulty walking;Decreased mobility;Impaired UE functional use;Decreased balance;Decreased coordination;Decreased endurance   Rehab Potential Good   Clinical Impairments Affecting Rehab Potential freezing of gait   OT Frequency 2x / week   OT Duration 6 weeks   OT Treatment/Interventions Self-care/ADL  training;Gait Training;Functional Mobility Training;Therapeutic exercises;Patient/family education;DME and/or AE instruction   Consulted and Agree with Plan of Care Patient;Family member/caregiver   Family Member Consulted wife        Problem List Patient Active Problem List   Diagnosis Date Noted  . Skin erythema 06/05/2013  . Aortic stenosis 05/08/2013  . CAD S/P percutaneous coronary angioplasty 05/03/2013  . HTN (hypertension) 05/03/2013  . Type 2 diabetes mellitus with vascular disease 05/03/2013  . HLD (hyperlipidemia) 05/03/2013  . Parkinson disease 05/03/2013  . Chronic headache 05/03/2013  . Abnormality of gait 03/21/2012  . Paralysis agitans 03/21/2012  Achilles Dunk, OTR/L, CLT Lovett,Amy 08/17/2014, 4:41 PM  [place] [facility] MAIN [facility] SERVICES [address] [place], [place], [address] Phone: [phone]   Fax:  [phone]

## 2014-08-21 ENCOUNTER — Ambulatory Visit: Payer: Medicare Other | Admitting: Occupational Therapy

## 2014-08-21 ENCOUNTER — Encounter: Payer: Self-pay | Admitting: Occupational Therapy

## 2014-08-21 DIAGNOSIS — M6281 Muscle weakness (generalized): Secondary | ICD-10-CM

## 2014-08-21 DIAGNOSIS — R262 Difficulty in walking, not elsewhere classified: Secondary | ICD-10-CM

## 2014-08-21 DIAGNOSIS — R279 Unspecified lack of coordination: Secondary | ICD-10-CM

## 2014-08-21 DIAGNOSIS — R296 Repeated falls: Secondary | ICD-10-CM

## 2014-08-22 NOTE — Therapy (Addendum)
[place] MAIN [facility] SERVICES [address] [place], [place], [address] Phone: [phone]   Fax:  [phone]  Occupational Therapy Treatment Occupational Therapy Progress Note 07/30/2014 to 08/21/2014  Patient Details  Name: Craig Dennis. MRN: [id number] Date of Birth: 06-11-1933 Referring Provider:  Idelle Crouch, MD  Encounter Date: 08/21/2014      OT End of Session - 08/21/14 1707    Visit Number 20   Number of Visits 29   Date for OT Re-Evaluation 09/14/14   Authorization Type medicare G code 20   Authorization Time Period 29   OT Start Time 1000   OT Stop Time 1100   OT Time Calculation (min) 60 min   Equipment Utilized During Treatment rollator at times   Activity Tolerance Patient tolerated treatment well   Behavior During Therapy [facility] for tasks assessed/performed      Past Medical History  Diagnosis Date  . Parkinson's disease     Primarily right sided features  . Dyslipidemia   . History of chest pain   . Diabetes   . History of headache     chronic, recurrent, postconcussive, after fall from a collapsed deck, had been treated wtih prn fiorinal  . Hypertension   . Erectile dysfunction   . Colon polyps   . CAD (coronary artery disease)   . Plantar fasciitis   . Gait disorder   . Myocardial infarction   . Heart murmur   . Parkinson's disease   . BPH (benign prostatic hyperplasia)   . HOH (hard of hearing)   . Cancer     skin    Past Surgical History  Procedure Laterality Date  . Cardicac stent    . Hemorrhoid surgery    . Tonsillectomy and adenoidectomy    . Coronary angioplasty      stent  . Cataract extraction w/phaco Right 06/28/2014    Procedure: CATARACT EXTRACTION PHACO AND INTRAOCULAR LENS PLACEMENT (IOC);  Surgeon: Lyla Glassing, MD;  Location: [facility] ORS;  Service: Ophthalmology;  Laterality: Right;  [place]    1:07.4               AP     12.6             CDE   8.50    There were no vitals  filed for this visit.  Visit Diagnosis:  Difficulty walking  Muscle weakness (generalized)  Lack of coordination  Falls frequently      Subjective Assessment - 08/21/14 1707    Patient is accompained by: Family member   Limitations disease progression, freezing of gait behaviors, fall risk   Patient Stated Goals Patient reports he would like to be as independent as possible, he wants to get back to going to the gym and walking on the treadmill and biking.  He wants his legs to be stronger, his balance better and safe with walking.  He would love to play golf again.     Currently in Pain? No/denies   Multiple Pain Sites No                      OT Treatments/Exercises (OP) - 08/21/14 1710    ADLs   Overall ADLs Patient seen for functional component tasks of sit to stand, crossing right and left legs to prep for tying shoes, stairs, curb negotiation and turning in small spaces for 5 reps each activity, CGA as needed for turning behaviors and  cues for moving BIG.   Neurological Re-education Exercises   Other Exercises 1 LSVT Daily Session Maximal Daily Exercises: Sustained movements are designed to rescale the amplitude of movement output for generalization to daily functional activities. Performed as follows for 1 set of 10 repetitions each: Multi directional sustained movements- 1) Floor to ceiling, 2) Side to side. Multi directional Repetitive movements performed in standing and are designed to provide retraining effort needed for sustained muscle activation in tasks Performed as follows: 3) Step and reach forward, 4) Step and Reach Backwards, 5) Step and reach sideways, 6) Rock and reach forward/backward, 7) Rock and reach sideways.   Other Exercises 2 Patient seen for focus on freezing of gait patterns this date with a variety of techniques used such as visualization of antipated movement in situations such as crossing the thresholds, getting onto the elevator and moving in  a crowd. Patient seen for high repetitions of crossing thresholds with use of rollator in more quiet area and with plans to progress to more congested areas.  Focused this date on initiation and termination of gait on command along with turning behaviors with verbal and tactile cues.                 OT Education - 08/21/14 1707    Education provided Yes   Person(s) Educated Patient;Spouse   Methods Demonstration;Explanation;Verbal cues   Comprehension Verbal cues required;Returned demonstration;Verbalized understanding             OT Long Term Goals - 08/22/14 1602    OT LONG TERM GOAL #1   Title Patient will decrease frequency of freezing episodes with score of 11 or less on Freezing of Gait Questionnaire in 4 weeks.   Baseline significant freezing behaviors, score of 15 on Freezing of Gait Questionairre, performance can flucuate on a daily basis.   Time 4   Period Weeks   Status On-going   OT LONG TERM GOAL #2   Title Patient will improve gait speed and endurance and be able to walk 1500 feet in 6 minutes to negotiate around the home and community safely in 4 weeks.   Baseline Patient now limits his community activities, avoids crowds if possible. Ambulating 1340 feet at evaluation, 1350 this date but with improved quality of steps, amplitude of gait and a reduction in freezing behaviors.   Period Weeks   Status Partially Met   OT LONG TERM GOAL #3   Title Patient will complete HEP for maximal daily exercises with modified independence in 4 weeks.   Baseline occasional cues required and CGA with exercises in standing or modified with use of chair   Period Weeks   Status Partially Met   OT LONG TERM GOAL #4   Title Patient will transfer from sit to stand without the use of arms safely and independently from a variety of chairs/surfaces in 4 weeks.    Baseline Patient able to complete now from normal chair height with cues, will progress to lower height chairs and unstable  surfaces.   Time 4   Status Achieved   OT LONG TERM GOAL #5   Title Patient will demonstrate the ability to navigate through a crowd in a restaurant with minimal freezing of gait and no falls with cues if needed.   Baseline continues to demonstrate these behaviors significant freezing behaviors now in restaurants and crowds which continues but can flucuate daily.   Time 4   Period Weeks   Status Partially Met  OT LONG TERM GOAL #6   Title Patient will navigate safely through revolving doors with rollator, verbal cues and supervision.   Baseline significant freezing of gait at times.   Time 4   Period Weeks   Status Partially Met   OT LONG TERM GOAL #7   Title Patient will demonstrate turning in small spaces safely with verbal cues and close supervision.   Baseline difficulty with turning and freezing behaviors which increase fall risk .   Time 4   Period Weeks   Status On-going   OT LONG TERM GOAL #8   Title Patient will demonstrate the ability to cross over a variety of thresholds with minimal hesistation 80% of the time, with minimal cues.   Baseline occurs 90% or greater of the time.   Time 4   Period Weeks   Status On-going               Plan - 2014/09/06 1708    Clinical Impression Statement Patient continues to demo difficulty with freezing behaviors and needs to continue with work on initiation and termination of gait tasks as well as turning behaviors especially with emphasis on turning feet first rather than trunk.  Patient did have a reported fall last week at a [place] at [place] in which his wife witnessed him turning  his body first, his feet were frozen and resulted in patient falling onto his hip.  He denied pain or discomfort at the time and did not seek any medical attention.  He continues to benefit from high repetition trials of increased amplitude of movement and focus on freezing behaviors to improve gait and safety with daily tasks.    Pt will benefit from  skilled therapeutic intervention in order to improve on the following deficits (Retired) Abnormal gait;Decreased strength;Decreased knowledge of use of DME;Difficulty walking;Decreased mobility;Impaired UE functional use;Decreased balance;Decreased coordination;Decreased endurance   Rehab Potential Good   Clinical Impairments Affecting Rehab Potential freezing of gait   OT Frequency 2x / week   OT Duration 6 weeks   OT Treatment/Interventions Self-care/ADL training;Gait Training;Functional Mobility Training;Therapeutic exercises;Patient/family education;DME and/or AE instruction   OT Home Exercise Plan Patient is required to perform daily homework/exercise tasks as a part of the program. Although the intensive portion of the program has been completed, therapist recommends patient continue with exercises 2 times a day.   Consulted and Agree with Plan of Care Patient;Family member/caregiver   Family Member Consulted wife          G-Codes - 2014/09/06 1709    Functional Assessment Tool Used clinical judgment, 6 minute walk test, freezing of gait questionairre, self care assessment   Functional Limitation Mobility: Walking and moving around   Mobility: Walking and Moving Around Current Status [phone]) At least 40 percent but less than 60 percent impaired, limited or restricted   Mobility: Walking and Moving Around Goal Status [phone]) At least 20 percent but less than 40 percent impaired, limited or restricted      Problem List Patient Active Problem List   Diagnosis Date Noted  . Skin erythema 06/05/2013  . Aortic stenosis 05/08/2013  . CAD S/P percutaneous coronary angioplasty 05/03/2013  . HTN (hypertension) 05/03/2013  . Type 2 diabetes mellitus with vascular disease 05/03/2013  . HLD (hyperlipidemia) 05/03/2013  . Parkinson disease 05/03/2013  . Chronic headache 05/03/2013  . Abnormality of gait 03/21/2012  . Paralysis agitans 03/21/2012   Terease Marcotte T Zakari Bathe, OTR/L,  CLT Alice Vitelli 08/22/2014, 4:03 PM  [place]  [facility] MAIN [facility] SERVICES [place], [place], [address] Phone: [phone]   Fax:  [phone]

## 2014-08-23 ENCOUNTER — Encounter: Payer: Self-pay | Admitting: Occupational Therapy

## 2014-08-23 ENCOUNTER — Ambulatory Visit: Payer: Medicare Other | Admitting: Occupational Therapy

## 2014-08-23 DIAGNOSIS — R262 Difficulty in walking, not elsewhere classified: Secondary | ICD-10-CM

## 2014-08-23 DIAGNOSIS — R296 Repeated falls: Secondary | ICD-10-CM

## 2014-08-23 DIAGNOSIS — M6281 Muscle weakness (generalized): Secondary | ICD-10-CM

## 2014-08-23 DIAGNOSIS — R279 Unspecified lack of coordination: Secondary | ICD-10-CM

## 2014-08-24 NOTE — Therapy (Signed)
[place] MAIN [facility] SERVICES [address] [place], [place], [address] Phone: [phone]   Fax:  [phone]  Occupational Therapy Treatment  Patient Details  Name: Craig Dennis. MRN: [id number] Date of Birth: 08-29-1933 Referring Provider:  Vladimir Crofts, MD  Encounter Date: 08/23/2014      OT End of Session - 08/23/14 2202    Visit Number 21   Number of Visits 29   Date for OT Re-Evaluation 09/14/14   Authorization Type medicare G code 21   Authorization Time Period 60   OT Start Time 1009   OT Stop Time 1105   OT Time Calculation (min) 56 min   Equipment Utilized During Treatment rollator at times   Activity Tolerance Patient tolerated treatment well   Behavior During Therapy [facility] for tasks assessed/performed      Past Medical History  Diagnosis Date  . Parkinson's disease     Primarily right sided features  . Dyslipidemia   . History of chest pain   . Diabetes   . History of headache     chronic, recurrent, postconcussive, after fall from a collapsed deck, had been treated wtih prn fiorinal  . Hypertension   . Erectile dysfunction   . Colon polyps   . CAD (coronary artery disease)   . Plantar fasciitis   . Gait disorder   . Myocardial infarction   . Heart murmur   . Parkinson's disease   . BPH (benign prostatic hyperplasia)   . HOH (hard of hearing)   . Cancer     skin    Past Surgical History  Procedure Laterality Date  . Cardicac stent    . Hemorrhoid surgery    . Tonsillectomy and adenoidectomy    . Coronary angioplasty      stent  . Cataract extraction w/phaco Right 06/28/2014    Procedure: CATARACT EXTRACTION PHACO AND INTRAOCULAR LENS PLACEMENT (IOC);  Surgeon: Lyla Glassing, MD;  Location: [facility] ORS;  Service: Ophthalmology;  Laterality: Right;  [place]    1:07.4               AP     12.6             CDE   8.50    There were no vitals filed for this visit.  Visit Diagnosis:  Difficulty  walking  Muscle weakness (generalized)  Lack of coordination  Falls frequently      Subjective Assessment - 08/23/14 2201    Subjective  Patient reports he had a awesome day yesterday and felt he was walking normal, wasn't even using a cane or any assistive device.  Wife said she couldn't believe how well he was walking.  He was using his rollator today to come to therapy.  Wants to work on walking on the treadmill.    Patient is accompained by: Family member   Limitations disease progression, freezing of gait behaviors, fall risk   Patient Stated Goals Patient reports he would like to be as independent as possible, he wants to get back to going to the gym and walking on the treadmill and biking.  He wants his legs to be stronger, his balance better and safe with walking.  He would love to play golf again.     Currently in Pain? No/denies   Multiple Pain Sites No                      OT Treatments/Exercises (  OP) - 08/23/14 2201    ADLs   Overall ADLs Patient seen for crossing legs to tie shoes in addition to other functional component tasks of sit to stand, stairs, turning in small spaces and crossing thresholds.    Neurological Re-education Exercises   Other Exercises 1 LSVT Daily Session Maximal Daily Exercises: Sustained movements are designed to rescale the amplitude of movement output for generalization to daily functional activities. Performed as follows for 1 set of 10 repetitions each: Multi directional sustained movements- 1) Floor to ceiling, 2) Side to side. Multi directional Repetitive movements performed in standing and are designed to provide retraining effort needed for sustained muscle activation in tasks Performed as follows: 3) Step and reach forward, 4) Step and Reach Backwards, 5) Step and reach sideways, 6) Rock and reach forward/backward, 7) Rock and reach sideways. Sit to stand from mat table on lowest setting with cues for weight shift, technique and CGA  for 5 reps for 2 sets.  Focused on treadmill walking trial to deterrmine safety, patient able to walk .25 mile on the treadmill at 2.5 MPH speed with therapist providing CGA and safety button attached.                  OT Education - 08/23/14 2201    Education provided Yes   Education Details treadmill walking, exercises, freezing of gait.   Person(s) Educated Patient;Spouse   Methods Explanation;Demonstration;Verbal cues   Comprehension Verbal cues required;Returned demonstration;Verbalized understanding             OT Long Term Goals - 08/22/14 1602    OT LONG TERM GOAL #1   Title Patient will decrease frequency of freezing episodes with score of 11 or less on Freezing of Gait Questionnaire in 4 weeks.   Baseline significant freezing behaviors, score of 15 on Freezing of Gait Questionairre, performance can flucuate on a daily basis.   Time 4   Period Weeks   Status On-going   OT LONG TERM GOAL #2   Title Patient will improve gait speed and endurance and be able to walk 1500 feet in 6 minutes to negotiate around the home and community safely in 4 weeks.   Baseline Patient now limits his community activities, avoids crowds if possible. Ambulating 1340 feet at evaluation, 1350 this date but with improved quality of steps, amplitude of gait and a reduction in freezing behaviors.   Period Weeks   Status Partially Met   OT LONG TERM GOAL #3   Title Patient will complete HEP for maximal daily exercises with modified independence in 4 weeks.   Baseline occasional cues required and CGA with exercises in standing or modified with use of chair   Period Weeks   Status Partially Met   OT LONG TERM GOAL #4   Title Patient will transfer from sit to stand without the use of arms safely and independently from a variety of chairs/surfaces in 4 weeks.    Baseline Patient able to complete now from normal chair height with cues, will progress to lower height chairs and unstable surfaces.    Time 4   Status Achieved   OT LONG TERM GOAL #5   Title Patient will demonstrate the ability to navigate through a crowd in a restaurant with minimal freezing of gait and no falls with cues if needed.   Baseline continues to demonstrate these behaviors significant freezing behaviors now in restaurants and crowds which continues but can flucuate daily.   Time 4  Period Weeks   Status Partially Met   OT LONG TERM GOAL #6   Title Patient will navigate safely through revolving doors with rollator, verbal cues and supervision.   Baseline significant freezing of gait at times.   Time 4   Period Weeks   Status Partially Met   OT LONG TERM GOAL #7   Title Patient will demonstrate turning in small spaces safely with verbal cues and close supervision.   Baseline difficulty with turning and freezing behaviors which increase fall risk .   Time 4   Period Weeks   Status On-going   OT LONG TERM GOAL #8   Title Patient will demonstrate the ability to cross over a variety of thresholds with minimal hesistation 80% of the time, with minimal cues.   Baseline occurs 90% or greater of the time.   Time 4   Period Weeks   Status On-going               Plan - 08/23/14 2203    Clinical Impression Statement Patient has been able to perform 2 trials of treadmill walking without any freezing behaviors, he tends to have freezing more with turning, crossing thresholds, and approaching crowds.  Wife reports she will be with patient at the gym while exercising.  Suggested patient may want to consider other options such as the nu step or the bike as well.     Rehab Potential Good   Clinical Impairments Affecting Rehab Potential freezing of gait   OT Frequency 2x / week   OT Duration 6 weeks   OT Treatment/Interventions Self-care/ADL training;Gait Training;Functional Mobility Training;Therapeutic exercises;Patient/family education;DME and/or AE instruction   Consulted and Agree with Plan of Care  Patient;Family member/caregiver   Family Member Consulted wife        Problem List Patient Active Problem List   Diagnosis Date Noted  . Skin erythema 06/05/2013  . Aortic stenosis 05/08/2013  . CAD S/P percutaneous coronary angioplasty 05/03/2013  . HTN (hypertension) 05/03/2013  . Type 2 diabetes mellitus with vascular disease 05/03/2013  . HLD (hyperlipidemia) 05/03/2013  . Parkinson disease 05/03/2013  . Chronic headache 05/03/2013  . Abnormality of gait 03/21/2012  . Paralysis agitans 03/21/2012   Achilles Dunk, OTR/L, CLT Arihana Ambrocio 08/24/2014, 1:47 PM  [place] MAIN [facility] SERVICES [address] [place], [place], [address] Phone: [phone]   Fax:  [phone]

## 2014-08-29 ENCOUNTER — Encounter: Payer: Self-pay | Admitting: Occupational Therapy

## 2014-08-29 ENCOUNTER — Ambulatory Visit: Payer: Medicare Other | Admitting: Occupational Therapy

## 2014-08-29 DIAGNOSIS — R296 Repeated falls: Secondary | ICD-10-CM

## 2014-08-29 DIAGNOSIS — R279 Unspecified lack of coordination: Secondary | ICD-10-CM

## 2014-08-29 DIAGNOSIS — R262 Difficulty in walking, not elsewhere classified: Secondary | ICD-10-CM

## 2014-08-29 DIAGNOSIS — M6281 Muscle weakness (generalized): Secondary | ICD-10-CM

## 2014-08-29 NOTE — Therapy (Signed)
[place] MAIN [facility] SERVICES [address] [place], [place], [address] Phone: [phone]   Fax:  [phone]  Occupational Therapy Treatment  Patient Details  Name: Craig Dennis. MRN: [id number] Date of Birth: 11-04-1933 Referring Provider:  Vladimir Crofts, MD  Encounter Date: 08/29/2014      OT End of Session - 08/29/14 2057    Visit Number 22   Number of Visits 29   Date for OT Re-Evaluation 09/14/14   Authorization Type medicare G code 22   Authorization Time Period 53   OT Start Time 1103   OT Stop Time 1205   OT Time Calculation (min) 62 min   Equipment Utilized During Treatment rollator at times   Activity Tolerance Patient tolerated treatment well   Behavior During Therapy [facility] for tasks assessed/performed      Past Medical History  Diagnosis Date  . Parkinson's disease     Primarily right sided features  . Dyslipidemia   . History of chest pain   . Diabetes   . History of headache     chronic, recurrent, postconcussive, after fall from a collapsed deck, had been treated wtih prn fiorinal  . Hypertension   . Erectile dysfunction   . Colon polyps   . CAD (coronary artery disease)   . Plantar fasciitis   . Gait disorder   . Myocardial infarction   . Heart murmur   . Parkinson's disease   . BPH (benign prostatic hyperplasia)   . HOH (hard of hearing)   . Cancer     skin    Past Surgical History  Procedure Laterality Date  . Cardicac stent    . Hemorrhoid surgery    . Tonsillectomy and adenoidectomy    . Coronary angioplasty      stent  . Cataract extraction w/phaco Right 06/28/2014    Procedure: CATARACT EXTRACTION PHACO AND INTRAOCULAR LENS PLACEMENT (IOC);  Surgeon: Lyla Glassing, MD;  Location: [facility] ORS;  Service: Ophthalmology;  Laterality: Right;  [place]    1:07.4               AP     12.6             CDE   8.50    There were no vitals filed for this visit.  Visit Diagnosis:  Difficulty  walking  Muscle weakness (generalized)  Lack of coordination  Falls frequently      Subjective Assessment - 08/29/14 2055    Subjective  Patient reports he is doing well and is very pleased with his progress, he is planning to get back to going to the gym with his wife and he feels like the last few sessions have been especially beneficial for freezing behavior focus.    Patient is accompained by: Family member   Limitations disease progression, freezing of gait behaviors, fall risk   Patient Stated Goals Patient reports he would like to be as independent as possible, he wants to get back to going to the gym and walking on the treadmill and biking.  He wants his legs to be stronger, his balance better and safe with walking.  He would love to play golf again.     Currently in Pain? No/denies   Multiple Pain Sites No                      OT Treatments/Exercises (OP) - 08/29/14 2101    ADLs   ADL  Comments Patient seen self care tasks of crossing legs to tie shoes with repeated trials, managing stairs with minimal cues and SBA and entrance into small spaces such as the closet or bathroom and using rollator to turn with use of quarter turns safely with cues.    Neurological Re-education Exercises   Other Exercises 1 LSVT Daily Session Maximal Daily Exercises: Sustained movements are designed to rescale the amplitude of movement output for generalization to daily functional activities. Performed as follows for 1 set of 10 repetitions each: Multi directional sustained movements- 1) Floor to ceiling, 2) Side to side. Multi directional Repetitive movements performed in standing and are designed to provide retraining effort needed for sustained muscle activation in tasks Performed as follows: 3) Step and reach forward, 4) Step and Reach Backwards, 5) Step and reach sideways, 6) Rock and reach forward/backward, 7) Rock and reach sideways. Sit to stand from mat table on lowest setting with  cues for weight shift, technique and CGA for 5 reps for 2 sets. Focused on treadmill walking trial to deterrmine safety, patient able to walk .25 mile on the treadmill at 2.5 MPH speed with therapist providing CGA and safety button attached.    Other Exercises 2 Patient also seen for focus on freezing of gait patterns, functional mobility with rollator to manage obstacles and initiation and termination behaviors.                OT Education - 08/29/14 2057    Education provided Yes   Education Details HEP, treadmill walking, freezing of gait   Person(s) Educated Patient;Spouse   Methods Explanation;Demonstration;Verbal cues   Comprehension Verbal cues required;Returned demonstration;Verbalized understanding             OT Long Term Goals - 08/22/14 1602    OT LONG TERM GOAL #1   Title Patient will decrease frequency of freezing episodes with score of 11 or less on Freezing of Gait Questionnaire in 4 weeks.   Baseline significant freezing behaviors, score of 15 on Freezing of Gait Questionairre, performance can flucuate on a daily basis.   Time 4   Period Weeks   Status On-going   OT LONG TERM GOAL #2   Title Patient will improve gait speed and endurance and be able to walk 1500 feet in 6 minutes to negotiate around the home and community safely in 4 weeks.   Baseline Patient now limits his community activities, avoids crowds if possible. Ambulating 1340 feet at evaluation, 1350 this date but with improved quality of steps, amplitude of gait and a reduction in freezing behaviors.   Period Weeks   Status Partially Met   OT LONG TERM GOAL #3   Title Patient will complete HEP for maximal daily exercises with modified independence in 4 weeks.   Baseline occasional cues required and CGA with exercises in standing or modified with use of chair   Period Weeks   Status Partially Met   OT LONG TERM GOAL #4   Title Patient will transfer from sit to stand without the use of arms  safely and independently from a variety of chairs/surfaces in 4 weeks.    Baseline Patient able to complete now from normal chair height with cues, will progress to lower height chairs and unstable surfaces.   Time 4   Status Achieved   OT LONG TERM GOAL #5   Title Patient will demonstrate the ability to navigate through a crowd in a restaurant with minimal freezing of gait and no  falls with cues if needed.   Baseline continues to demonstrate these behaviors significant freezing behaviors now in restaurants and crowds which continues but can flucuate daily.   Time 4   Period Weeks   Status Partially Met   OT LONG TERM GOAL #6   Title Patient will navigate safely through revolving doors with rollator, verbal cues and supervision.   Baseline significant freezing of gait at times.   Time 4   Period Weeks   Status Partially Met   OT LONG TERM GOAL #7   Title Patient will demonstrate turning in small spaces safely with verbal cues and close supervision.   Baseline difficulty with turning and freezing behaviors which increase fall risk .   Time 4   Period Weeks   Status On-going   OT LONG TERM GOAL #8   Title Patient will demonstrate the ability to cross over a variety of thresholds with minimal hesistation 80% of the time, with minimal cues.   Baseline occurs 90% or greater of the time.   Time 4   Period Weeks   Status On-going               Plan - 08/29/14 2058    Clinical Impression Statement Patient has shown good progress with implementing strategies to diminish freezing of gait behaviors especially in the last few visits with these areas targeted and performed in a high repetition trialed status.     Pt will benefit from skilled therapeutic intervention in order to improve on the following deficits (Retired) Abnormal gait;Decreased strength;Decreased knowledge of use of DME;Difficulty walking;Decreased mobility;Impaired UE functional use;Decreased balance;Decreased  coordination;Decreased endurance   Rehab Potential Good   Clinical Impairments Affecting Rehab Potential freezing of gait   OT Frequency 2x / week   OT Duration 6 weeks   OT Treatment/Interventions Self-care/ADL training;Gait Training;Functional Mobility Training;Therapeutic exercises;Patient/family education;DME and/or AE instruction   OT Home Exercise Plan Patient is required to perform daily homework/exercise tasks as a part of the program. Although the intensive portion of the program has been completed, therapist recommends patient continue with exercises 2 times a day.   Consulted and Agree with Plan of Care Patient;Family member/caregiver   Family Member Consulted wife        Problem List Patient Active Problem List   Diagnosis Date Noted  . Skin erythema 06/05/2013  . Aortic stenosis 05/08/2013  . CAD S/P percutaneous coronary angioplasty 05/03/2013  . HTN (hypertension) 05/03/2013  . Type 2 diabetes mellitus with vascular disease 05/03/2013  . HLD (hyperlipidemia) 05/03/2013  . Parkinson disease 05/03/2013  . Chronic headache 05/03/2013  . Abnormality of gait 03/21/2012  . Paralysis agitans 03/21/2012   Achilles Dunk, OTR/L, CLT Craig Dennis 08/29/2014, 9:04 PM  [place] MAIN [facility] SERVICES [address] [place], [place], [address] Phone: [phone]   Fax:  [phone]

## 2014-08-31 ENCOUNTER — Encounter: Payer: Self-pay | Admitting: Occupational Therapy

## 2014-08-31 ENCOUNTER — Ambulatory Visit: Payer: Medicare Other | Admitting: Occupational Therapy

## 2014-08-31 DIAGNOSIS — R262 Difficulty in walking, not elsewhere classified: Secondary | ICD-10-CM | POA: Diagnosis not present

## 2014-08-31 DIAGNOSIS — R279 Unspecified lack of coordination: Secondary | ICD-10-CM

## 2014-08-31 DIAGNOSIS — R296 Repeated falls: Secondary | ICD-10-CM

## 2014-08-31 DIAGNOSIS — M6281 Muscle weakness (generalized): Secondary | ICD-10-CM

## 2014-08-31 NOTE — Therapy (Signed)
[place] MAIN [facility] SERVICES [address] [place], [place], [address] Phone: [phone]   Fax:  [phone]  Occupational Therapy Treatment/Discharge Summary  Patient Details  Name: Craig Dennis. MRN: [id number] Date of Birth: 1933/08/13 Referring Provider:  Vladimir Crofts, MD  Encounter Date: 08/31/2014      OT End of Session - 08/31/14 1730    Visit Number 23   Number of Visits 29   Date for OT Re-Evaluation 09/14/14   Authorization Type medicare G code 23   Authorization Time Period 55   OT Start Time 0907   OT Stop Time 1005   OT Time Calculation (min) 58 min   Equipment Utilized During Treatment rollator at times   Activity Tolerance Patient tolerated treatment well   Behavior During Therapy [facility] for tasks assessed/performed      Past Medical History  Diagnosis Date  . Parkinson's disease     Primarily right sided features  . Dyslipidemia   . History of chest pain   . Diabetes   . History of headache     chronic, recurrent, postconcussive, after fall from a collapsed deck, had been treated wtih prn fiorinal  . Hypertension   . Erectile dysfunction   . Colon polyps   . CAD (coronary artery disease)   . Plantar fasciitis   . Gait disorder   . Myocardial infarction   . Heart murmur   . Parkinson's disease   . BPH (benign prostatic hyperplasia)   . HOH (hard of hearing)   . Cancer     skin    Past Surgical History  Procedure Laterality Date  . Cardicac stent    . Hemorrhoid surgery    . Tonsillectomy and adenoidectomy    . Coronary angioplasty      stent  . Cataract extraction w/phaco Right 06/28/2014    Procedure: CATARACT EXTRACTION PHACO AND INTRAOCULAR LENS PLACEMENT (IOC);  Surgeon: Lyla Glassing, MD;  Location: [facility] ORS;  Service: Ophthalmology;  Laterality: Right;  [place]    1:07.4               AP     12.6             CDE   8.50    There were no vitals filed for this visit.  Visit Diagnosis:   Difficulty walking  Muscle weakness (generalized)  Lack of coordination  Falls frequently      Subjective Assessment - 08/31/14 1724    Subjective  Patient reports he feels coming to therapy has really helped him and he has some tools to assist him with his functional mobility as well as freezing behaviors.  He reports he liked the functional component tasks which were directed towards his deficits.     Patient is accompained by: Family member   Limitations disease progression, freezing of gait behaviors, fall risk   Special Tests 6 minute walk test, 5 times sit to stand, BERG balance test   Patient Stated Goals Patient reports he would like to be as independent as possible, he wants to get back to going to the gym and walking on the treadmill and biking.  He wants his legs to be stronger, his balance better and safe with walking.  He would love to play golf again.     Currently in Pain? No/denies   Multiple Pain Sites No  OT Treatments/Exercises (OP) - 08/31/14 1728    ADLs   ADL Comments Review of functional component tasks, patient able to complete each one successfully.  Supervision for the stairs.    Neurological Re-education Exercises   Other Exercises 1 LSVT Daily Session Maximal Daily Exercises: Sustained movements are designed to rescale the amplitude of movement output for generalization to daily functional activities. Performed as follows for 1 set of 10 repetitions each: Multi directional sustained movements- 1) Floor to ceiling, 2) Side to side. Multi directional Repetitive movements performed in standing and are designed to provide retraining effort needed for sustained muscle activation in tasks Performed as follows: 3) Step and reach forward, 4) Step and Reach Backwards, 5) Step and reach sideways, 6) Rock and reach forward/backward, 7) Rock and reach sideways. Sit to stand from mat table on lowest setting with cues for weight shift,  technique and CGA for 5 reps for 2 sets   Other Exercises 2 Outcome measures as follows:  6 minute walk test 1350 feet, 5 times sit to stand 12 sec, freezing of gait questionairre 11, BERG balance 48/56                OT Education - 08/31/14 1730    Education provided Yes   Education Details HEP, freezing of gait strategies, functional mobility, treadmill walking, use of rollator.   Person(s) Educated Patient;Spouse   Methods Explanation;Demonstration;Verbal cues   Comprehension Verbal cues required;Returned demonstration;Verbalized understanding             OT Long Term Goals - 08/31/14 1736    OT LONG TERM GOAL #1   Title Patient will decrease frequency of freezing episodes with score of 11 or less on Freezing of Gait Questionnaire in 4 weeks.   Time 4   Period Weeks   Status Achieved   OT LONG TERM GOAL #2   Title Patient will improve gait speed and endurance and be able to walk 1500 feet in 6 minutes to negotiate around the home and community safely in 4 weeks.   Baseline 6 minute walk test 1350 feet   Time 4   Period Weeks   Status Partially Met   OT LONG TERM GOAL #3   Title Patient will complete HEP for maximal daily exercises with modified independence in 4 weeks.   Baseline supervision from wife   Time 4   Period Weeks   Status Partially Met   OT LONG TERM GOAL #4   Title Patient will transfer from sit to stand without the use of arms safely and independently from a variety of chairs/surfaces in 4 weeks.    Time 4   Period Weeks   Status Achieved   OT LONG TERM GOAL #5   Title Patient will demonstrate the ability to navigate through a crowd in a restaurant with minimal freezing of gait and no falls with cues if needed.   Time 4   Period Weeks   Status Achieved   OT LONG TERM GOAL #6   Title Patient will navigate safely through revolving doors with rollator, verbal cues and supervision.   Baseline deferred this goal and used side doors of hospital  since it is rare he would have to use revolving doors.   Time 4   Period Weeks   Status Partially Met   OT LONG TERM GOAL #7   Title Patient will demonstrate turning in small spaces safely with verbal cues and close supervision.   Time 4  Period Weeks   Status Achieved   OT LONG TERM GOAL #8   Title Patient will demonstrate the ability to cross over a variety of thresholds with minimal hesistation 80% of the time, with minimal cues.   Time 4   Period Weeks   Status Achieved               Plan - 30-Sep-2014 1731    Clinical Impression Statement Patient has made excellent progress with LSVT BIG protocol for Parkinson's disease and continued to make progress in select areas with extended visits.  He has partially met goals and is able to demo HEP with use of handouts and supervision from wife.  He has demonstrated significant improvements with freezing of gait and has implemented strategies to help diminish these behaviors.  Wife is aware of cues to provide patient in these situations.  He tends to  do well with walking in areas which do no require many turns or traffic.  He still has some mild freezing behaviors with crossing threaholds, enter and exiting the elevator and with turns.  He demonstrates some freezing with approaching crowds but is able to demo stategies to remain safe and correct his balance from leaning too far forwards which is when he tends to lose his balance.  Will plan to discharge this date with patient and wife in agreement and he will continue with exercises and strategies at home. Goals partially met.   Pt will benefit from skilled therapeutic intervention in order to improve on the following deficits (Retired) Abnormal gait;Decreased strength;Decreased knowledge of use of DME;Difficulty walking;Decreased mobility;Impaired UE functional use;Decreased balance;Decreased coordination;Decreased endurance   Rehab Potential Good   Clinical Impairments Affecting Rehab  Potential freezing of gait   OT Frequency 2x / week   OT Duration 6 weeks   OT Treatment/Interventions Self-care/ADL training;Gait Training;Functional Mobility Training;Therapeutic exercises;Patient/family education;DME and/or AE instruction   OT Home Exercise Plan Patient is required to perform daily homework/exercise tasks as a part of the program. Although the intensive portion of the program has been completed, therapist recommends patient continue with exercises 2 times a day.   Consulted and Agree with Plan of Care Patient;Family member/caregiver   Family Member Consulted wife          G-Codes - 2014-09-30 1741    Functional Assessment Tool Used clinical judgment, 6 minute walk test, freezing of gait questionairre, self care assessment   Functional Limitation Mobility: Walking and moving around   Mobility: Walking and Moving Around Current Status [phone]) At least 40 percent but less than 60 percent impaired, limited or restricted   Mobility: Walking and Moving Around Goal Status [phone]) At least 20 percent but less than 40 percent impaired, limited or restricted   Mobility: Walking and Moving Around Discharge Status [phone]) At least 20 percent but less than 40 percent impaired, limited or restricted      Problem List Patient Active Problem List   Diagnosis Date Noted  . Skin erythema 06/05/2013  . Aortic stenosis 05/08/2013  . CAD S/P percutaneous coronary angioplasty 05/03/2013  . HTN (hypertension) 05/03/2013  . Type 2 diabetes mellitus with vascular disease 05/03/2013  . HLD (hyperlipidemia) 05/03/2013  . Parkinson disease 05/03/2013  . Chronic headache 05/03/2013  . Abnormality of gait 03/21/2012  . Paralysis agitans 03/21/2012   Achilles Dunk, OTR/L, CLT Evora Schechter 09-30-2014, 5:42 PM  [place] MAIN [facility] SERVICES [address] [place], [place], [address] Phone: [phone]   Fax:  [phone]

## 2015-02-27 ENCOUNTER — Observation Stay
Admission: EM | Admit: 2015-02-27 | Discharge: 2015-02-28 | Disposition: A | Discharge status: Home / Self Care | Payer: Medicare Other | Attending: Internal Medicine | Admitting: Internal Medicine

## 2015-02-27 ENCOUNTER — Emergency Department: Payer: Medicare Other

## 2015-02-27 ENCOUNTER — Encounter: Payer: Self-pay | Admitting: Emergency Medicine

## 2015-02-27 ENCOUNTER — Inpatient Hospital Stay
Admit: 2015-02-27 | Discharge: 2015-02-27 | Disposition: A | Discharge status: Home / Self Care | Payer: Medicare Other | Attending: Internal Medicine | Admitting: Internal Medicine

## 2015-02-27 DIAGNOSIS — G2581 Restless legs syndrome: Secondary | ICD-10-CM | POA: Insufficient documentation

## 2015-02-27 DIAGNOSIS — M4802 Spinal stenosis, cervical region: Secondary | ICD-10-CM | POA: Insufficient documentation

## 2015-02-27 DIAGNOSIS — I6523 Occlusion and stenosis of bilateral carotid arteries: Secondary | ICD-10-CM | POA: Diagnosis not present

## 2015-02-27 DIAGNOSIS — Z9841 Cataract extraction status, right eye: Secondary | ICD-10-CM | POA: Insufficient documentation

## 2015-02-27 DIAGNOSIS — Z7982 Long term (current) use of aspirin: Secondary | ICD-10-CM | POA: Insufficient documentation

## 2015-02-27 DIAGNOSIS — I10 Essential (primary) hypertension: Secondary | ICD-10-CM | POA: Diagnosis not present

## 2015-02-27 DIAGNOSIS — B370 Candidal stomatitis: Secondary | ICD-10-CM | POA: Diagnosis not present

## 2015-02-27 DIAGNOSIS — I350 Nonrheumatic aortic (valve) stenosis: Secondary | ICD-10-CM | POA: Diagnosis not present

## 2015-02-27 DIAGNOSIS — Z79899 Other long term (current) drug therapy: Secondary | ICD-10-CM | POA: Insufficient documentation

## 2015-02-27 DIAGNOSIS — I2510 Atherosclerotic heart disease of native coronary artery without angina pectoris: Secondary | ICD-10-CM | POA: Diagnosis not present

## 2015-02-27 DIAGNOSIS — Z7984 Long term (current) use of oral hypoglycemic drugs: Secondary | ICD-10-CM | POA: Insufficient documentation

## 2015-02-27 DIAGNOSIS — I252 Old myocardial infarction: Secondary | ICD-10-CM | POA: Insufficient documentation

## 2015-02-27 DIAGNOSIS — Z833 Family history of diabetes mellitus: Secondary | ICD-10-CM | POA: Diagnosis not present

## 2015-02-27 DIAGNOSIS — W19XXXA Unspecified fall, initial encounter: Secondary | ICD-10-CM | POA: Insufficient documentation

## 2015-02-27 DIAGNOSIS — N400 Enlarged prostate without lower urinary tract symptoms: Secondary | ICD-10-CM | POA: Diagnosis not present

## 2015-02-27 DIAGNOSIS — I340 Nonrheumatic mitral (valve) insufficiency: Secondary | ICD-10-CM | POA: Diagnosis not present

## 2015-02-27 DIAGNOSIS — R531 Weakness: Secondary | ICD-10-CM

## 2015-02-27 DIAGNOSIS — I071 Rheumatic tricuspid insufficiency: Secondary | ICD-10-CM | POA: Diagnosis not present

## 2015-02-27 DIAGNOSIS — Z8249 Family history of ischemic heart disease and other diseases of the circulatory system: Secondary | ICD-10-CM | POA: Insufficient documentation

## 2015-02-27 DIAGNOSIS — Z955 Presence of coronary angioplasty implant and graft: Secondary | ICD-10-CM | POA: Insufficient documentation

## 2015-02-27 DIAGNOSIS — Z820 Family history of epilepsy and other diseases of the nervous system: Secondary | ICD-10-CM | POA: Insufficient documentation

## 2015-02-27 DIAGNOSIS — Z8719 Personal history of other diseases of the digestive system: Secondary | ICD-10-CM | POA: Insufficient documentation

## 2015-02-27 DIAGNOSIS — Z8042 Family history of malignant neoplasm of prostate: Secondary | ICD-10-CM | POA: Diagnosis not present

## 2015-02-27 DIAGNOSIS — E119 Type 2 diabetes mellitus without complications: Secondary | ICD-10-CM | POA: Diagnosis not present

## 2015-02-27 DIAGNOSIS — Z86010 Personal history of colonic polyps: Secondary | ICD-10-CM | POA: Diagnosis not present

## 2015-02-27 DIAGNOSIS — E785 Hyperlipidemia, unspecified: Secondary | ICD-10-CM | POA: Insufficient documentation

## 2015-02-27 DIAGNOSIS — Z87891 Personal history of nicotine dependence: Secondary | ICD-10-CM | POA: Insufficient documentation

## 2015-02-27 DIAGNOSIS — G20 Parkinson's disease: Secondary | ICD-10-CM | POA: Insufficient documentation

## 2015-02-27 DIAGNOSIS — R4781 Slurred speech: Secondary | ICD-10-CM | POA: Diagnosis present

## 2015-02-27 DIAGNOSIS — R079 Chest pain, unspecified: Secondary | ICD-10-CM | POA: Insufficient documentation

## 2015-02-27 DIAGNOSIS — G459 Transient cerebral ischemic attack, unspecified: Principal | ICD-10-CM | POA: Insufficient documentation

## 2015-02-27 DIAGNOSIS — R011 Cardiac murmur, unspecified: Secondary | ICD-10-CM | POA: Insufficient documentation

## 2015-02-27 DIAGNOSIS — R4182 Altered mental status, unspecified: Secondary | ICD-10-CM

## 2015-02-27 LAB — URINALYSIS COMPLETE WITH MICROSCOPIC (ARMC ONLY)
BILIRUBIN URINE: NEGATIVE
Bacteria, UA: NONE SEEN
Glucose, UA: NEGATIVE mg/dL
Hgb urine dipstick: NEGATIVE
LEUKOCYTES UA: NEGATIVE
NITRITE: NEGATIVE
PH: 5 (ref 5.0–8.0)
Protein, ur: NEGATIVE mg/dL
SPECIFIC GRAVITY, URINE: 1.015 (ref 1.005–1.030)
Squamous Epithelial / LPF: NONE SEEN
WBC, UA: NONE SEEN WBC/hpf (ref 0–5)

## 2015-02-27 LAB — TSH: TSH: 1.332 u[IU]/mL (ref 0.350–4.500)

## 2015-02-27 LAB — CBC
HEMATOCRIT: 42.2 % (ref 40.0–52.0)
HEMOGLOBIN: 13.9 g/dL (ref 13.0–18.0)
MCH: 32.9 pg (ref 26.0–34.0)
MCHC: 32.8 g/dL (ref 32.0–36.0)
MCV: 100.1 fL — ABNORMAL HIGH (ref 80.0–100.0)
Platelets: 188 10*3/uL (ref 150–440)
RBC: 4.22 MIL/uL — AB (ref 4.40–5.90)
RDW: 13.2 % (ref 11.5–14.5)
WBC: 9.9 10*3/uL (ref 3.8–10.6)

## 2015-02-27 LAB — GLUCOSE, CAPILLARY: Glucose-Capillary: 101 mg/dL — ABNORMAL HIGH (ref 65–99)

## 2015-02-27 LAB — BASIC METABOLIC PANEL
ANION GAP: 9 (ref 5–15)
BUN: 13 mg/dL (ref 6–20)
CO2: 25 mmol/L (ref 22–32)
Calcium: 10.2 mg/dL (ref 8.9–10.3)
Chloride: 100 mmol/L — ABNORMAL LOW (ref 101–111)
Creatinine, Ser: 0.79 mg/dL (ref 0.61–1.24)
GFR calc Af Amer: >60 mL/min (ref >60)
Glucose, Bld: 105 mg/dL — ABNORMAL HIGH (ref 65–99)
POTASSIUM: 5.2 mmol/L — AB (ref 3.5–5.1)
SODIUM: 134 mmol/L — AB (ref 135–145)

## 2015-02-27 MED ORDER — ADULT MULTIVITAMIN W/MINERALS CH
1.0000 | ORAL_TABLET | Freq: Every day | ORAL | Status: DC
  Administered 2015-02-28: 08:00:00 1 via ORAL
  Filled 2015-02-27: qty 1

## 2015-02-27 MED ORDER — ASPIRIN EC 81 MG PO TBEC: 81.0000 mg | DELAYED_RELEASE_TABLET | Freq: Every day | ORAL | Status: DC

## 2015-02-27 MED ORDER — SODIUM CHLORIDE 0.9 % IV SOLN
INTRAVENOUS | Status: DC
  Administered 2015-02-27: 18:00:00 via INTRAVENOUS

## 2015-02-27 MED ORDER — BUTALBITAL-APAP-CAFFEINE 50-325-40 MG PO TABS
1.0000 | ORAL_TABLET | Freq: Four times a day (QID) | ORAL | Status: DC | PRN
  Filled 2015-02-27: qty 1

## 2015-02-27 MED ORDER — TAMSULOSIN HCL 0.4 MG PO CAPS
0.4000 mg | ORAL_CAPSULE | Freq: Every day | ORAL | Status: DC
  Administered 2015-02-27 – 2015-02-28 (×2): 0.4 mg via ORAL
  Filled 2015-02-27 (×2): qty 1

## 2015-02-27 MED ORDER — ROPINIROLE HCL 1 MG PO TABS
4.0000 mg | ORAL_TABLET | Freq: Every day | ORAL | Status: DC
  Administered 2015-02-27: 22:00:00 4 mg via ORAL
  Filled 2015-02-27: qty 4

## 2015-02-27 MED ORDER — LINAGLIPTIN 5 MG PO TABS
5.0000 mg | ORAL_TABLET | Freq: Every day | ORAL | Status: DC
  Administered 2015-02-28: 5 mg via ORAL
  Filled 2015-02-27: qty 1

## 2015-02-27 MED ORDER — NYSTATIN 100000 UNIT/ML MT SUSP
5.0000 mL | Freq: Four times a day (QID) | OROMUCOSAL | Status: DC
  Administered 2015-02-27 – 2015-02-28 (×2): 500000 [IU] via OROMUCOSAL
  Filled 2015-02-27 (×3): qty 5

## 2015-02-27 MED ORDER — ONDANSETRON HCL 4 MG PO TABS: 4.0000 mg | ORAL_TABLET | Freq: Four times a day (QID) | ORAL | Status: DC | PRN

## 2015-02-27 MED ORDER — SIMVASTATIN 40 MG PO TABS
40.0000 mg | ORAL_TABLET | Freq: Every day | ORAL | Status: DC
  Administered 2015-02-27: 40 mg via ORAL
  Filled 2015-02-27: qty 1

## 2015-02-27 MED ORDER — METFORMIN HCL 500 MG PO TABS
1000.0000 mg | ORAL_TABLET | Freq: Two times a day (BID) | ORAL | Status: DC
  Administered 2015-02-27 – 2015-02-28 (×2): 1000 mg via ORAL
  Filled 2015-02-27 (×2): qty 2

## 2015-02-27 MED ORDER — NON FORMULARY: 1.5000 | Status: DC

## 2015-02-27 MED ORDER — RASAGILINE MESYLATE 1 MG PO TABS
1.0000 mg | ORAL_TABLET | Freq: Every day | ORAL | Status: DC
  Administered 2015-02-27: 22:00:00 1 mg via ORAL
  Filled 2015-02-27 (×2): qty 1

## 2015-02-27 MED ORDER — ASPIRIN EC 325 MG PO TBEC
325.0000 mg | DELAYED_RELEASE_TABLET | Freq: Every day | ORAL | Status: DC
  Administered 2015-02-28: 325 mg via ORAL
  Filled 2015-02-27: qty 1

## 2015-02-27 MED ORDER — AMANTADINE HCL 100 MG PO CAPS
100.0000 mg | ORAL_CAPSULE | Freq: Two times a day (BID) | ORAL | Status: DC
  Administered 2015-02-27 – 2015-02-28 (×2): 100 mg via ORAL
  Filled 2015-02-27 (×5): qty 1

## 2015-02-27 MED ORDER — CARBIDOPA-LEVODOPA ER 50-200 MG PO TBCR
1.0000 | EXTENDED_RELEASE_TABLET | ORAL | Status: DC
  Filled 2015-02-27 (×4): qty 1

## 2015-02-27 MED ORDER — ENOXAPARIN SODIUM 40 MG/0.4ML ~~LOC~~ SOLN
40.0000 mg | SUBCUTANEOUS | Status: DC
  Administered 2015-02-27: 40 mg via SUBCUTANEOUS
  Filled 2015-02-27: qty 0.4

## 2015-02-27 MED ORDER — GLIMEPIRIDE 2 MG PO TABS
1.0000 mg | ORAL_TABLET | Freq: Two times a day (BID) | ORAL | Status: DC
  Administered 2015-02-27 – 2015-02-28 (×3): 1 mg via ORAL
  Filled 2015-02-27 (×3): qty 1

## 2015-02-27 MED ORDER — ROPINIROLE HCL 1 MG PO TABS
10.0000 mg | ORAL_TABLET | Freq: Every day | ORAL | Status: DC
  Administered 2015-02-27: 10 mg via ORAL

## 2015-02-27 MED ORDER — ONDANSETRON HCL 4 MG/2ML IJ SOLN: 4.0000 mg | Freq: Four times a day (QID) | INTRAMUSCULAR | Status: DC | PRN

## 2015-02-27 MED ORDER — ACETAMINOPHEN 650 MG RE SUPP: 650.0000 mg | Freq: Four times a day (QID) | RECTAL | Status: DC | PRN

## 2015-02-27 MED ORDER — SODIUM CHLORIDE 0.9% FLUSH
3.0000 mL | Freq: Two times a day (BID) | INTRAVENOUS | Status: DC
  Administered 2015-02-27 – 2015-02-28 (×3): 3 mL via INTRAVENOUS

## 2015-02-27 MED ORDER — ACETAMINOPHEN 325 MG PO TABS: 650.0000 mg | ORAL_TABLET | Freq: Four times a day (QID) | ORAL | Status: DC | PRN

## 2015-02-27 MED ORDER — CARBIDOPA-LEVODOPA ER 50-200 MG PO TBCR: 0.5000 | EXTENDED_RELEASE_TABLET | Freq: Three times a day (TID) | ORAL | Status: DC

## 2015-02-27 NOTE — ED Notes (Signed)
Patient presents to the ED for weakness and syncope.  Patient had two episodes of falling "legs giving out", at about 4am and 9:30am.  Familiy states patients mental status did not seem completely normal when he was lying in the floor after falling.  Patient is alert and oriented at this time.  Grip strength is equal.  Patient's smile is not completely even.  Patient has a history of parkinsons.  Patient is in no obvious distress at this time.  Patient denies pain.

## 2015-02-27 NOTE — ED Notes (Signed)
Per patient family, patient fell twice this morning, states that his legs just gave out, per family report patient was very disoriented and his pupils were uneven. Patient denies any injuries but states that he was feeling weak this morning when he fell.

## 2015-02-27 NOTE — ED Provider Notes (Signed)
[facility] Emergency Department Provider Note  ____________________________________________  Time seen: Approximately 1:48 PM  I have reviewed the triage vital signs and the nursing notes.   HISTORY  Chief Complaint Weakness and Fall    HPI Craig Dennis. is a 82 y.o. male the history of Parkinson's disease,HTN, CAD status post MI brought by EMS for generalized weakness, slurred speech, and altered mental status. Patient reports that he got up at 3 AM to change his Depends.  After the exertion of doing so, he had generalized weakness and felt weak in both legs so he slumped down to the floor. He laid there for 1 hour until he and his wife were able to get him back into bed. He went back to sleep, but the next time he tried taking stand up he had a fall due to bilateral leg weakness again. On arrival by EMS, the patient did not recognize his son-in-law, and he was having slurred speech. There was concern that his left pupil was enlarged compared to his right only after light was shining in it. Blood sugar was 123. At this time, the patient feels back to normal and the family states he is at his normal baseline. No recent illness including cough or cold symptoms, nausea vomiting or diarrhea, chest pain, shortness of breath, syncope. The only medication change has had recently was 2 weeks where he was out of one of his Parkinson's drugs.   Past Medical History  Diagnosis Date  . Parkinson's disease     Primarily right sided features  . Dyslipidemia   . History of chest pain   . Diabetes ([place])   . History of headache     chronic, recurrent, postconcussive, after fall from a collapsed deck, had been treated wtih prn fiorinal  . Hypertension   . Erectile dysfunction   . Colon polyps   . CAD (coronary artery disease)   . Plantar fasciitis   . Gait disorder   . Myocardial infarction ([place])   . Heart murmur   . Parkinson's disease ([place])   . BPH (benign  prostatic hyperplasia)   . HOH (hard of hearing)   . Cancer [facility])     skin    Patient Active Problem List   Diagnosis Date Noted  . Skin erythema 06/05/2013  . Aortic stenosis 05/08/2013  . CAD S/P percutaneous coronary angioplasty 05/03/2013  . HTN (hypertension) 05/03/2013  . Type 2 diabetes mellitus with vascular disease ([place]) 05/03/2013  . HLD (hyperlipidemia) 05/03/2013  . Parkinson disease ([place]) 05/03/2013  . Chronic headache 05/03/2013  . Abnormality of gait 03/21/2012  . Paralysis agitans ([place]) 03/21/2012    Past Surgical History  Procedure Laterality Date  . Cardicac stent    . Hemorrhoid surgery    . Tonsillectomy and adenoidectomy    . Coronary angioplasty      stent  . Cataract extraction w/phaco Right 06/28/2014    Procedure: CATARACT EXTRACTION PHACO AND INTRAOCULAR LENS PLACEMENT (IOC);  Surgeon: Lyla Glassing, MD;  Location: [facility] ORS;  Service: Ophthalmology;  Laterality: Right;  [place]    1:07.4               AP     12.6             CDE   8.50    Current Outpatient Rx  Name  Route  Sig  Dispense  Refill  . amantadine (SYMMETREL) 100 MG capsule   Oral   Take 100  mg by mouth 2 (two) times daily.         Marland Kitchen aspirin 81 MG tablet   Oral   Take 81 mg by mouth daily.         . butalbital-aspirin-caffeine (FIORINAL) 50-325-40 MG per capsule      Take 1 capsules by mouth 4 times a day as needed. Patient not taking: Reported on 06/28/2014   40 capsule   0   . carbidopa-levodopa (SINEMET CR) 50-200 MG per tablet      Take one and one half tablets by mouth at 7 am each morning and one half tablet by mouth at 11 am and 1 tablet by mouth at 2:00 pm.         . CINNAMON PO   Oral   Take 2,000 mg by mouth daily.         Marland Kitchen ezetimibe-simvastatin (VYTORIN) 10-40 MG per tablet   Oral   Take 1 tablet by mouth at bedtime.         Marland Kitchen glimepiride (AMARYL) 2 MG tablet   Oral   Take 2 mg by mouth daily with breakfast.         . metFORMIN (GLUCOPHAGE) 1000  MG tablet   Oral   Take 1,000 mg by mouth 2 (two) times daily with a meal.         . Multiple Vitamins-Minerals (MULTIVITAMINS THER. W/MINERALS) TABS   Oral   Take 1 tablet by mouth daily.           . rasagiline (AZILECT) 1 MG TABS tablet   Oral   Take 1 mg by mouth daily.         . Ropinirole HCl (REQUIP XL) 12 MG TB24   Oral   Take 14 mg by mouth daily.          . simvastatin (ZOCOR) 40 MG tablet   Oral   Take 1 tablet (40 mg total) by mouth daily.   90 tablet   1   . tamsulosin (FLOMAX) 0.4 MG CAPS capsule   Oral   Take 0.4 mg by mouth.         Marland Kitchen UNABLE TO FIND      Med Name:tragenta         . valsartan (DIOVAN) 80 MG tablet   Oral   Take 80 mg by mouth daily.           Allergies Review of patient's allergies indicates no known allergies.  Family History  Problem Relation Age of Onset  . Heart disease Mother   . Heart attack Mother   . Diabetes Mother   . Tremor Mother     Benign essential tremor, but not parkinson's disease  . Prostate cancer Father   . Heart disease Father     Social History Social History  Substance Use Topics  . Smoking status: Former Smoker    Quit date: 02/03/1955  . Smokeless tobacco: Never Used  . Alcohol Use: No    Review of Systems Constitutional: No fever/chills. Positive generalized weakness. Negative lightheadedness or syncope. Eyes: No visual changes. No blurred or double vision. Positive abnormal pupil exam. ENT: No sore throat. Cardiovascular: Denies chest pain, palpitations. Respiratory: Denies shortness of breath.  No cough. Gastrointestinal: No abdominal pain.  No nausea, no vomiting.  No diarrhea.  No constipation. Genitourinary: Negative for dysuria. Musculoskeletal: Negative for back pain. Skin: Negative for rash. Neurological: Negative for headaches, focal weakness or numbness. Positive slurred speech and  altered mental status.   10-point ROS otherwise  negative.  ____________________________________________   PHYSICAL EXAM:  VITAL SIGNS: ED Triage Vitals  Enc Vitals Group     BP 02/27/15 1207 128/68 mmHg     Pulse Rate 02/27/15 1207 77     Resp 02/27/15 1207 18     Temp 02/27/15 1207 97.6 F (36.4 C)     Temp Source 02/27/15 1207 Oral     SpO2 02/27/15 1207 97 %     Weight 02/27/15 1207 213 lb (96.616 kg)     Height 02/27/15 1207 '6\' 1"'$  (1.854 m)     Head Cir --      Peak Flow --      Pain Score 02/27/15 1207 0     Pain Loc --      Pain Edu? --      Excl. in [place]? --     Constitutional:Patient is alert and oriented and answering questions appropriately. Eyes: Conjunctivae are normal.  EOMI. PERRLA.  Head: Atraumatic. Nose: No congestion/rhinnorhea. Mouth/Throat: Mucous membranes are dry.  Neck: No stridor.  Supple.  No JVD.  Cardiovascular: Normal rate, regular rhythm. No murmurs, rubs or gallops.  Respiratory: Normal respiratory effort.  No retractions. Lungs CTAB.  No wheezes, rales or ronchi. Gastrointestinal: Soft and nontender. No distention. No peritoneal signs. Musculoskeletal: Bilateral symmetric pitting edema to just above the ankles. No palpable cords or tenderness to palpation in the calves.  Neurologic: Alert and oriented 3. Speech is clear. Face and smile symmetric. EOMI, PERRLA.  No pronator drift; patient does have a baseline tremor in the right upper extremity during this part of the testing. 5 out of 5 grip, biceps, triceps, hip flexors, plantar flexion and dorsiflexion. Normal sensation to light touch in the bilateral upper and lower extremities, and face.  Skin:  Skin is warm, dry and intact. No rash noted. Psychiatric: Mood and affect are normal. Speech and behavior are normal.  Normal judgement.  ____________________________________________   LABS (all labs ordered are listed, but only abnormal results are displayed)  Labs Reviewed  BASIC METABOLIC PANEL - Abnormal; Notable for the following:     Sodium 134 (*)    Potassium 5.2 (*)    Chloride 100 (*)    Glucose, Bld 105 (*)    All other components within normal limits  CBC - Abnormal; Notable for the following:    RBC 4.22 (*)    MCV 100.1 (*)    All other components within normal limits  GLUCOSE, CAPILLARY - Abnormal; Notable for the following:    Glucose-Capillary 101 (*)    All other components within normal limits  URINALYSIS COMPLETEWITH MICROSCOPIC ([facility] ONLY)  CBG MONITORING, ED   ____________________________________________  EKG  ED ECG REPORT I, Eula Listen, the attending physician, personally viewed and interpreted this ECG.   Date: 02/27/2015   Rate: 77  Rhythm: normal sinus rhythm  Axis: normal  Intervals:none  ST&T Change: no st elevation  ____________________________________________  RADIOLOGY  Ct Head Wo Contrast  02/27/2015  CLINICAL DATA:  [place] twice this morning, questionable loss of consciousness, weakness, slurred speech, unequal and slow pupils, history Parkinson's, dyslipidemia, type II diabetes mellitus, hypertension, coronary artery disease post MI, former smoker EXAM: CT HEAD WITHOUT CONTRAST TECHNIQUE: Contiguous axial images were obtained from the base of the skull through the vertex without intravenous contrast. COMPARISON:  07/15/2013 FINDINGS: Generalized atrophy. Normal ventricular morphology. No midline shift or mass effect. Scattered beam hardening artifacts arising from calvaria. Small  vessel chronic ischemic changes of deep cerebral white matter. Small old LEFT mid caudate lacunar infarct. No intracranial hemorrhage, mass lesion, or acute infarction. Visualized paranasal sinuses and mastoid air cells clear. Bones unremarkable. Atherosclerotic calcifications at the carotid siphons. IMPRESSION: Atrophy with small vessel chronic ischemic changes of deep cerebral white matter. 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 Electronically Signed   By: Lavonia Dana M.D.   On: 02/27/2015 13:10     ____________________________________________   PROCEDURES  Procedure(s) performed: None  Critical Care performed: No ____________________________________________   INITIAL IMPRESSION / ASSESSMENT AND PLAN / ED COURSE  Pertinent labs & imaging results that were available during my care of the patient were reviewed by me and considered in my medical decision making (see chart for details).  82 y.o. male with a history of Parkinson's presenting for generalized weakness and a two-minute self resolved episode of slurred speech and altered mental status. There are many causes for potential causes for these symptoms, but I am concerned that he needs evaluation for TIA. At this time, the patient is hemodynamically stable. He has had a CT scan which shows no acute intracranial process, his urinalysis does not show UTI and his labs are otherwise normal except for a slightly elevated potassium that is slightly hemolyzed. I'll plan to admit the patient to the hospitalists for further evaluation.  ____________________________________________  FINAL CLINICAL IMPRESSION(S) / ED DIAGNOSES  Final diagnoses:  Generalized weakness  Altered mental status, unspecified altered mental status type  Slurred speech      NEW MEDICATIONS STARTED DURING THIS VISIT:  New Prescriptions   No medications on file     Eula Listen, MD 02/27/15 1356

## 2015-02-27 NOTE — H&P (Signed)
Craig Dennis at [place] NAME: Craig Dennis    MR#:  [id number]  DATE OF BIRTH:  24-Feb-1933  DATE OF ADMISSION:  02/27/2015  PRIMARY CARE PHYSICIAN: SPARKS,JEFFREY D, MD   REQUESTING/REFERRING PHYSICIAN:   CHIEF COMPLAINT:   Chief Complaint  Patient presents with  . Weakness  . Fall    HISTORY OF PRESENT ILLNESS: Craig Dennis  is a 82 y.o. male with a known history of  Parkinson's disease, dyslipidemia, diabetes, hypertension and coronary artery disease who woke up at 3 AM to change his depends. Patient both legs got weak and he and I went down to the floor. Had a hard time getting up. He went back to sleep. Then patient had another episode where he got very weak. He could not recognize his son and low and he was having slurred speech. This lasted only a few minutes. His symptoms now resolved. Patient is very weak and feeling tired. He denies any fevers chills no chest pain or shortness of breath no nausea vomiting or diarrhea. PAST MEDICAL HISTORY:   Past Medical History  Diagnosis Date  . Parkinson's disease     Primarily right sided features  . Dyslipidemia   . History of chest pain   . Diabetes ([place])   . History of headache     chronic, recurrent, postconcussive, after fall from a collapsed deck, had been treated wtih prn fiorinal  . Hypertension   . Erectile dysfunction   . Colon polyps   . CAD (coronary artery disease)   . Plantar fasciitis   . Gait disorder   . Myocardial infarction ([place])   . Heart murmur   . Parkinson's disease ([place])   . BPH (benign prostatic hyperplasia)   . HOH (hard of hearing)   . Cancer ([place])     skin    PAST SURGICAL HISTORY: Past Surgical History  Procedure Laterality Date  . Cardicac stent    . Hemorrhoid surgery    . Tonsillectomy and adenoidectomy    . Coronary angioplasty      stent  . Cataract extraction w/phaco Right 06/28/2014    Procedure: CATARACT EXTRACTION PHACO AND INTRAOCULAR  LENS PLACEMENT (IOC);  Surgeon: Lyla Glassing, MD;  Location: [facility] ORS;  Service: Ophthalmology;  Laterality: Right;  [place]    1:07.4               AP     12.6             CDE   8.50    SOCIAL HISTORY:  Social History  Substance Use Topics  . Smoking status: Former Smoker    Quit date: 02/03/1955  . Smokeless tobacco: Never Used  . Alcohol Use: No    FAMILY HISTORY:  Family History  Problem Relation Age of Onset  . Heart disease Mother   . Heart attack Mother   . Diabetes Mother   . Tremor Mother     Benign essential tremor, but not parkinson's disease  . Prostate cancer Father   . Heart disease Father     DRUG ALLERGIES: No Known Allergies  REVIEW OF SYSTEMS:   CONSTITUTIONAL: No fever, positive fatigue and weakness.  EYES: No blurred or double vision.  EARS, NOSE, AND THROAT: No tinnitus or ear pain.  RESPIRATORY: No cough, shortness of breath, wheezing or hemoptysis.  CARDIOVASCULAR: No chest pain, orthopnea, edema.  GASTROINTESTINAL: No nausea, vomiting, diarrhea or abdominal pain.  GENITOURINARY: No dysuria, hematuria.  ENDOCRINE: No polyuria, nocturia,  HEMATOLOGY: No anemia, easy bruising or bleeding SKIN: No rash or lesion. MUSCULOSKELETAL: No joint pain or arthritis.   NEUROLOGIC: No tingling, numbness, weakness.  PSYCHIATRY: No anxiety or depression.   MEDICATIONS AT HOME:  Prior to Admission medications   Medication Sig Start Date End Date Taking? Authorizing Provider  amantadine (SYMMETREL) 100 MG capsule Take 100 mg by mouth 2 (two) times daily.   Yes Historical Provider, MD  aspirin EC 81 MG tablet Take 81 mg by mouth daily.   Yes Historical Provider, MD  butalbital-aspirin-caffeine [employer]) 50-325-40 MG capsule Take 1 capsule by mouth 4 (four) times daily as needed for migraine.   Yes Historical Provider, MD  carbidopa-levodopa (SINEMET CR) 50-200 MG per tablet Take 0.5-1.5 tablets by mouth 3 (three) times daily. Pt takes one and one-half tablet at 7am,  one-half tablet at 11am, and one tablet at 2pm.   Yes Historical Provider, MD  CINNAMON PO Take 2,000 mg by mouth daily.   Yes Historical Provider, MD  glimepiride (AMARYL) 2 MG tablet Take 1 mg by mouth 2 (two) times daily.    Yes Historical Provider, MD  linagliptin (TRADJENTA) 5 MG TABS tablet Take 5 mg by mouth daily.   Yes Historical Provider, MD  metFORMIN (GLUCOPHAGE) 1000 MG tablet Take 1,000 mg by mouth 2 (two) times daily.    Yes Historical Provider, MD  Multiple Vitamin (MULTIVITAMIN WITH MINERALS) TABS tablet Take 1 tablet by mouth daily.   Yes Historical Provider, MD  rasagiline (AZILECT) 1 MG TABS tablet Take 1 mg by mouth at bedtime.    Yes Historical Provider, MD  rOPINIRole (REQUIP) 4 MG tablet Take 4 mg by mouth at bedtime. Pt takes with two '5mg'$  tablets.   Yes Historical Provider, MD  ropinirole (REQUIP) 5 MG tablet Take 10 mg by mouth at bedtime. Pt takes with one '4mg'$  tablet.   Yes Historical Provider, MD  simvastatin (ZOCOR) 40 MG tablet Take 40 mg by mouth at bedtime.   Yes Historical Provider, MD  tamsulosin (FLOMAX) 0.4 MG CAPS capsule Take 0.4 mg by mouth daily.    Yes Historical Provider, MD      PHYSICAL EXAMINATION:   VITAL SIGNS: Blood pressure 111/69, pulse 77, temperature 97.6 F (36.4 C), temperature source Oral, resp. rate 20, height '6\' 1"'$  (1.854 m), weight 96.616 kg (213 lb), SpO2 97 %.  GENERAL:  82 y.o.-year-old patient lying in the bed with no acute distress.  EYES: Pupils equal, round, reactive to light and accommodation. No scleral icterus. Extraocular muscles intact.  HEENT: Head atraumatic, normocephalic. Oropharynx and nasopharynx clear.  NECK:  Supple, no jugular venous distention. No thyroid enlargement, no tenderness.  LUNGS: Normal breath sounds bilaterally, no wheezing, rales,rhonchi or crepitation. No use of accessory muscles of respiration.  CARDIOVASCULAR: S1, S2 normal. Positive systolic murmurs, rubs, or gallops.  ABDOMEN: Soft, nontender,  nondistended. Bowel sounds present. No organomegaly or mass.  EXTREMITIES: No pedal edema, cyanosis, or clubbing.  NEUROLOGIC: Cranial nerves II through XII are intact. Muscle strength 5/5 in all extremities. Sensation intact. Gait not checked.  PSYCHIATRIC: The patient is alert and oriented x 3.  SKIN: No obvious rash, lesion, or ulcer.   LABORATORY PANEL:   CBC  Recent Labs Lab 02/27/15 1233  WBC 9.9  HGB 13.9  HCT 42.2  PLT 188  MCV 100.1*  MCH 32.9  MCHC 32.8  RDW 13.2   ------------------------------------------------------------------------------------------------------------------  Chemistries   Recent Labs Lab 02/27/15 1233  NA 134*  K 5.2*  CL 100*  CO2 25  GLUCOSE 105*  BUN 13  CREATININE 0.79  CALCIUM 10.2   ------------------------------------------------------------------------------------------------------------------ estimated creatinine clearance is 91.7 mL/min (by C-G formula based on Cr of 0.79). ------------------------------------------------------------------------------------------------------------------ No results for input(s): TSH, T4TOTAL, T3FREE, THYROIDAB in the last 72 hours.  Invalid input(s): FREET3   Coagulation profile No results for input(s): INR, PROTIME in the last 168 hours. ------------------------------------------------------------------------------------------------------------------- No results for input(s): DDIMER in the last 72 hours. -------------------------------------------------------------------------------------------------------------------  Cardiac Enzymes No results for input(s): CKMB, TROPONINI, MYOGLOBIN in the last 168 hours.  Invalid input(s): CK ------------------------------------------------------------------------------------------------------------------ Invalid input(s):  POCBNP  ---------------------------------------------------------------------------------------------------------------  Urinalysis    Component Value Date/Time   COLORURINE YELLOW* 02/27/2015 1320   COLORURINE Yellow 07/15/2013 1536   APPEARANCEUR CLEAR* 02/27/2015 1320   APPEARANCEUR Clear 07/15/2013 1536   LABSPEC 1.015 02/27/2015 1320   LABSPEC 1.019 07/15/2013 1536   PHURINE 5.0 02/27/2015 1320   PHURINE 6.0 07/15/2013 1536   GLUCOSEU NEGATIVE 02/27/2015 1320   GLUCOSEU Negative 07/15/2013 1536   HGBUR NEGATIVE 02/27/2015 1320   HGBUR Negative 07/15/2013 1536   BILIRUBINUR NEGATIVE 02/27/2015 1320   BILIRUBINUR Negative 07/15/2013 1536   KETONESUR TRACE* 02/27/2015 1320   KETONESUR Trace 07/15/2013 1536   PROTEINUR NEGATIVE 02/27/2015 1320   PROTEINUR Negative 07/15/2013 1536   NITRITE NEGATIVE 02/27/2015 1320   NITRITE Negative 07/15/2013 1536   LEUKOCYTESUR NEGATIVE 02/27/2015 1320   LEUKOCYTESUR Negative 07/15/2013 1536     RADIOLOGY: Ct Head Wo Contrast  02/27/2015  CLINICAL DATA:  [place] twice this morning, questionable loss of consciousness, weakness, slurred speech, unequal and slow pupils, history Parkinson's, dyslipidemia, type II diabetes mellitus, hypertension, coronary artery disease post MI, former smoker EXAM: CT HEAD WITHOUT CONTRAST TECHNIQUE: Contiguous axial images were obtained from the base of the skull through the vertex without intravenous contrast. COMPARISON:  07/15/2013 FINDINGS: Generalized atrophy. Normal ventricular morphology. No midline shift or mass effect. Scattered beam hardening artifacts arising from calvaria. Small vessel chronic ischemic changes of deep cerebral white matter. Small old LEFT mid caudate lacunar infarct. No intracranial hemorrhage, mass lesion, or acute infarction. Visualized paranasal sinuses and mastoid air cells clear. Bones unremarkable. Atherosclerotic calcifications at the carotid siphons. IMPRESSION: Atrophy with small  vessel chronic ischemic changes of deep cerebral white matter. 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 Electronically Signed   By: Lavonia Dana M.D.   On: 02/27/2015 13:10    EKG: Orders placed or performed during the hospital encounter of 02/27/15  . ED EKG  . ED EKG  . EKG 12-Lead  . EKG 12-Lead    IMPRESSION AND PLAN: Patient is a 82 year old male presenting with generalized weakness  1. TIA- we will obtain carotid Dopplers. Echocardiogram of the heart. He also appears dehydrated I will give him IV fluids. We'll have physical therapy evaluate the patient. Continue aspirin for now.  2. Diabetes type ii continue regimen with Amaryl, Tradjenta, glucophage place patient on sliding scale insulin  3. Parkinson's disease continue carbidopa levodopa and amantadine  4. Restless leg syndrome continue Requip  5. Hyperlipidemia continue Zocor  6. Miscellaneous Lovenox for DVT prophylaxis:     All the records are reviewed and case discussed with ED provider. Management plans discussed with the patient, family and they are in agreement.  CODE STATUS:    Code Status Orders        Start     Ordered   02/27/15 1418  Full code   Continuous  02/27/15 1419    Code Status History    Date Active Date Inactive Code Status Order ID Comments User Context   This patient has a current code status but no historical code status.       TOTAL TIME TAKING CARE OF THIS PATIENT: 25mnutes.    PDustin FlockM.D on 02/27/2015 at 2:22 PM  Between 7am to 6pm - Pager - [phone]  After 6pm go to [email or web address] - password [email or web address]  Office  [phone] CC: Primary care physician; SIdelle Crouch MD

## 2015-02-27 NOTE — ED Notes (Signed)
Called 1C to give report, per Craig Dennis, Patient is going to be put in a different room and that room is not ready yet, she will have nurse call back to get report once room is ready.

## 2015-02-28 ENCOUNTER — Inpatient Hospital Stay: Payer: Medicare Other

## 2015-02-28 LAB — CBC
HCT: 38.9 % — ABNORMAL LOW (ref 40.0–52.0)
Hemoglobin: 13 g/dL (ref 13.0–18.0)
MCH: 32.9 pg (ref 26.0–34.0)
MCHC: 33.3 g/dL (ref 32.0–36.0)
MCV: 98.7 fL (ref 80.0–100.0)
PLATELETS: 190 10*3/uL (ref 150–440)
RBC: 3.94 MIL/uL — ABNORMAL LOW (ref 4.40–5.90)
RDW: 13.2 % (ref 11.5–14.5)
WBC: 9.8 10*3/uL (ref 3.8–10.6)

## 2015-02-28 LAB — BASIC METABOLIC PANEL
ANION GAP: 7 (ref 5–15)
BUN: 13 mg/dL (ref 6–20)
CHLORIDE: 103 mmol/L (ref 101–111)
CO2: 29 mmol/L (ref 22–32)
CREATININE: 0.98 mg/dL (ref 0.61–1.24)
Calcium: 9 mg/dL (ref 8.9–10.3)
GFR calc non Af Amer: >60 mL/min (ref >60)
Glucose, Bld: 105 mg/dL — ABNORMAL HIGH (ref 65–99)
POTASSIUM: 3.9 mmol/L (ref 3.5–5.1)
SODIUM: 139 mmol/L (ref 135–145)

## 2015-02-28 LAB — HEMOGLOBIN A1C: HEMOGLOBIN A1C: 7.4 % — AB (ref 4.0–6.0)

## 2015-02-28 MED ORDER — CARBIDOPA-LEVODOPA ER 50-200 MG PO TBCR: 1.0000 | EXTENDED_RELEASE_TABLET | Freq: Every day | ORAL | Status: DC

## 2015-02-28 MED ORDER — NYSTATIN 100000 UNIT/ML MT SUSP: 5.0000 mL | Freq: Four times a day (QID) | OROMUCOSAL | Status: DC

## 2015-02-28 MED ORDER — CARBIDOPA-LEVODOPA ER 25-100 MG PO TBCR
1.0000 | EXTENDED_RELEASE_TABLET | ORAL | Status: DC
  Administered 2015-02-28: 11:00:00 1 via ORAL
  Filled 2015-02-28: qty 1

## 2015-02-28 MED ORDER — CARBIDOPA-LEVODOPA ER 50-200 MG PO TBCR
2.0000 | EXTENDED_RELEASE_TABLET | Freq: Every day | ORAL | Status: DC
  Administered 2015-02-28: 08:00:00 2 via ORAL

## 2015-02-28 NOTE — Progress Notes (Addendum)
Physical Therapy Evaluation Patient Details Name: Craig Dennis. MRN: [id number] DOB: [birth year] Today's Date: 02/28/2015   History of Present Illness  Craig Dennis is a 82 y.o. male with a known history of Parkinson's disease, dyslipidemia, diabetes, hypertension and coronary artery disease who woke up at 3 AM to change his depends. Patient both legs got weak and he and I went down to the floor. Had a hard time getting up. He went back to sleep. Then patient had another episode where he got very weak. He could not recognize his son and low and he was having slurred speech. This lasted only a few minutes. His symptoms now resolved. Patient is very weak and feeling tired. He denies any fevers chills no chest pain or shortness of breath no nausea vomiting or diarrhea.  Clinical Impression  Pt presents with decreased balance, impulsivity, and decreased safety with functional mobility, especially with transfers and gait which is more related to pt's Parkinson's than to symptoms of TIA.  Wife reports that at home pt will fall onto his knees, sometimes daily, due to "freezing" episode, and he is able to get himself up.  Discussed at length with family symptoms of CVA and how to distinguish from symptoms of Parkinson's.  Although pt is close to baseline he would benefit from acute PT services to address safety concerns with mobility.   Follow Up Recommendations Outpatient PT    Equipment Recommendations  Rolling walker with 5" wheels    Recommendations for Other Services       Precautions / Restrictions Precautions Precautions: Fall Precaution Comments: HIGH Restrictions Weight Bearing Restrictions: No      Mobility  Bed Mobility Overal bed mobility: Modified Independent             General bed mobility comments: extra time and use of bed rails  Transfers Overall transfer level: Modified independent Equipment used: Rolling walker (2 wheeled)             General transfer  comment: braces against bed with B LE's for stability upon standing; impulsive with sitting, does not turn all the way around before sitting resulting in feet getting crossed.  Ambulation/Gait Ambulation/Gait assistance: Min guard Ambulation Distance (Feet): 200 Feet Assistive device: Rolling walker (2 wheeled) Gait Pattern/deviations: Narrow base of support;Decreased step length - right;Decreased step length - left;Shuffle     General Gait Details: Pt started at a very fast speed, almost carrying walker and needed repeated verbal cues to slow down.  Pt demonstrated several episodes of freezing when maneuvering around bed to recliner.  Stairs            Wheelchair Mobility    Modified Rankin (Stroke Patients Only)       Balance Overall balance assessment: Needs assistance Sitting-balance support: Feet supported Sitting balance-Leahy Scale: Good   Postural control: Posterior lean Standing balance support: Bilateral upper extremity supported;During functional activity Standing balance-Leahy Scale: Fair Standing balance comment: Pt compensates for decreased balance by bracing against bed with legs when standing; needs B UE support with dynamic activities                             Pertinent Vitals/Pain Pain Assessment: No/denies pain    Home Living Family/patient expects to be discharged to:: Private residence Living Arrangements: Spouse/significant other Available Help at Discharge: Family (Son 2 blocks away, SIL EMT) Type of Home: House Home Access: Stairs to enter Entrance Stairs-Rails: Right;Left  Entrance Stairs-Number of Steps: 4 Home Layout: One level Home Equipment: Walker - 4 wheels      Prior Function Level of Independence: Needs assistance         Comments: History of falls usually as a result of "freezing" from Parkinson's; falls forward onto knees with walker out in front     Hand Dominance        Extremity/Trunk Assessment    Upper Extremity Assessment: Overall WFL for tasks assessed           Lower Extremity Assessment: Overall WFL for tasks assessed         Communication   Communication: No difficulties  Cognition Arousal/Alertness: Awake/alert Behavior During Therapy: WFL for tasks assessed/performed;Impulsive Lobbyist)                        General Comments      Exercises        Assessment/Plan    PT Assessment Patient needs continued PT services  PT Diagnosis Abnormality of gait   PT Problem List Decreased balance;Decreased mobility;Decreased coordination;Decreased knowledge of use of DME;Decreased safety awareness  PT Treatment Interventions DME instruction;Gait training;Functional mobility training;Therapeutic activities;Balance training;Therapeutic exercise;Neuromuscular re-education;Patient/family education   PT Goals (Current goals can be found in the Care Plan section) Acute Rehab PT Goals Patient Stated Goal: "To go home." PT Goal Formulation: With patient Time For Goal Achievement: 03/14/15 Potential to Achieve Goals: Good    Frequency Min 2X/week   Barriers to discharge        Co-evaluation               End of Session Equipment Utilized During Treatment: Gait belt Activity Tolerance: Patient tolerated treatment well Patient left: in chair;with call bell/phone within reach;with chair alarm set;with family/visitor present Nurse Communication: Mobility status    Functional Assessment Tool Used: clinical judgement Functional Limitation: Mobility: Walking and moving around Mobility: Walking and Moving Around Current Status [phone]): At least 1 percent but less than 20 percent impaired, limited or restricted Mobility: Walking and Moving Around Goal Status [phone]): At least 1 percent but less than 20 percent impaired, limited or restricted    Time: 1130-1201 PT Time Calculation (min) (ACUTE ONLY): 31 min   Charges:   PT Evaluation $PT Eval Low  Complexity: 1 Procedure     PT G Codes:   PT G-Codes **NOT FOR INPATIENT CLASS** Functional Assessment Tool Used: clinical judgement Functional Limitation: Mobility: Walking and moving around Mobility: Walking and Moving Around Current Status ([id number]): At least 1 percent but less than 20 percent impaired, limited or restricted Mobility: Walking and Moving Around Goal Status [phone]): At least 1 percent but less than 20 percent impaired, limited or restricted    [employer], PT 02/28/2015, 2:04 PM

## 2015-02-28 NOTE — Discharge Summary (Signed)
Craig Reading., 82 y.o., DOB 11/08/1933, MRN [id number]. Admission date: 02/27/2015 Discharge Date 02/28/2015 Primary MD SPARKS,JEFFREY D, MD Admitting Physician Dustin Flock, MD  Admission Diagnosis  TIA (transient ischemic attack) [G45.9] Slurred speech [R47.81] Generalized weakness [R53.1] Altered mental status, unspecified altered mental status type [R41.82]  Discharge Diagnosis   Active Problems:   TIA (transient ischemic attack)  oral thrush Parkinson's disease Dyslipidemia History of chest pain Diabetes Hypertension Coronary artery disease History MI Heart murmur BPH Parkinson's disease       Hospital Course Craig Dennis is a 82 y.o. male with a known history of Parkinson's disease, dyslipidemia, diabetes, hypertension and coronary artery disease who woke up at 3 AM to change his depends. Patient both legs got weak and he and I went down to the floor. Had a hard time getting up. He went back to sleep. Then patient had another episode where he got very weak. He could not recognize his son and low and he was having slurred speech. This lasted only a few minutes. His symptoms now resolved. Patient was seen in the ER he appeared little dehydrated. He had a CT scan of the head which was negative. He was admitted under observation for further evaluation. Patient was ambulated with physical therapy and did well. They recommended outpatient physical therapy which will need to be around by primary care provider. He also was noted to have thrush which is currently being treated. He had carotid Dopplers which showed stenosis in the carotid artery but not significant enough for any intervention.           Consults  None  Significant Tests:  See full reports for all details      Ct Head Wo Contrast  02/27/2015  CLINICAL DATA:  [place] twice this morning, questionable loss of consciousness, weakness, slurred speech, unequal and slow pupils, history Parkinson's, dyslipidemia,  type II diabetes mellitus, hypertension, coronary artery disease post MI, former smoker EXAM: CT HEAD WITHOUT CONTRAST TECHNIQUE: Contiguous axial images were obtained from the base of the skull through the vertex without intravenous contrast. COMPARISON:  07/15/2013 FINDINGS: Generalized atrophy. Normal ventricular morphology. No midline shift or mass effect. Scattered beam hardening artifacts arising from calvaria. Small vessel chronic ischemic changes of deep cerebral white matter. Small old LEFT mid caudate lacunar infarct. No intracranial hemorrhage, mass lesion, or acute infarction. Visualized paranasal sinuses and mastoid air cells clear. Bones unremarkable. Atherosclerotic calcifications at the carotid siphons. IMPRESSION: Atrophy with small vessel chronic ischemic changes of deep cerebral white matter. 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 Electronically Signed   By: Lavonia Dana M.D.   On: 02/27/2015 13:10   US Carotid Bilateral  02/28/2015  CLINICAL DATA:  TIA EXAM: BILATERAL CAROTID DUPLEX ULTRASOUND TECHNIQUE: Pearline Cables scale imaging, color Doppler and duplex ultrasound were performed of bilateral carotid and vertebral arteries in the neck. COMPARISON:  None. FINDINGS: Criteria: Quantification of carotid stenosis is based on velocity parameters that correlate the residual internal carotid diameter with NASCET-based stenosis levels, using the diameter of the distal internal carotid lumen as the denominator for stenosis measurement. The following velocity measurements were obtained: RIGHT ICA:  116 cm/sec CCA:  774 cm/sec SYSTOLIC ICA/CCA RATIO:  0.9 DIASTOLIC ICA/CCA RATIO:  1.5 ECA:  123 cm/sec LEFT ICA:  101 cm/sec CCA:  128 cm/sec SYSTOLIC ICA/CCA RATIO:  0.7 DIASTOLIC ICA/CCA RATIO:  1.5 ECA:  208 cm/sec RIGHT CAROTID ARTERY: Mild calcified plaque in the bulb. Low resistance internal carotid Doppler pattern. RIGHT VERTEBRAL ARTERY:  Flow is primarily antegrade. There is some early diastolic reversal of flow.  LEFT CAROTID ARTERY: Moderate mixed plaque in the bulb. Low resistance internal carotid Doppler pattern. LEFT VERTEBRAL ARTERY:  Antegrade with a normal Doppler pattern. IMPRESSION: Less than 50% stenosis in the right and left internal carotid arteries. The right vertebral artery Doppler pattern is abnormal. Early subclavian steal syndrome cannot be excluded. Electronically Signed   By: Marybelle Killings M.D.   On: 02/28/2015 10:22       Today   Subjective:   Craig Dennis  feels well denies any complaints  Objective:   Blood pressure 113/58, pulse 86, temperature 98 F (36.7 C), temperature source Oral, resp. rate 20, height '6\' 1"'$  (1.854 m), weight 96.616 kg (213 lb), SpO2 96 %.  .  Intake/Output Summary (Last 24 hours) at 02/28/15 1441 Last data filed at 02/28/15 1300  Gross per 24 hour  Intake    360 ml  Output   2100 ml  Net  -1740 ml    Exam VITAL SIGNS: Blood pressure 113/58, pulse 86, temperature 98 F (36.7 C), temperature source Oral, resp. rate 20, height '6\' 1"'$  (1.854 m), weight 96.616 kg (213 lb), SpO2 96 %.  GENERAL:  82 y.o.-year-old patient lying in the bed with no acute distress.  EYES: Pupils equal, round, reactive to light and accommodation. No scleral icterus. Extraocular muscles intact.  HEENT: Head atraumatic, normocephalic. Oropharynx and nasopharynx clear.  NECK:  Supple, no jugular venous distention. No thyroid enlargement, no tenderness.  LUNGS: Normal breath sounds bilaterally, no wheezing, rales,rhonchi or crepitation. No use of accessory muscles of respiration.  CARDIOVASCULAR: S1, S2 normal.Systolic murmurs, rubs, or gallops.  ABDOMEN: Soft, nontender, nondistended. Bowel sounds present. No organomegaly or mass.  EXTREMITIES: No pedal edema, cyanosis, or clubbing.  NEUROLOGIC: Cranial nerves II through XII are intact. Muscle strength 5/5 in all extremities. Sensation intact. Gait not checked.  PSYCHIATRIC: The patient is alert and oriented x 3.  SKIN: No  obvious rash, lesion, or ulcer.   Data Review     CBC w Diff: Lab Results  Component Value Date   WBC 9.8 02/28/2015   WBC 10.3 07/15/2013   HGB 13.0 02/28/2015   HGB 12.9* 07/15/2013   HCT 38.9* 02/28/2015   HCT 37.9* 07/15/2013   PLT 190 02/28/2015   PLT 187 07/15/2013   CMP: Lab Results  Component Value Date   NA 139 02/28/2015   NA 137 07/15/2013   K 3.9 02/28/2015   K 4.0 07/15/2013   CL 103 02/28/2015   CL 101 07/15/2013   CO2 29 02/28/2015   CO2 31 07/15/2013   BUN 13 02/28/2015   BUN 15 07/15/2013   CREATININE 0.98 02/28/2015   CREATININE 0.87 07/15/2013   PROT 7.1 07/15/2013   ALBUMIN 3.8 07/15/2013   BILITOT 0.3 07/15/2013   ALKPHOS 61 07/15/2013   AST 16 07/15/2013   ALT 10* 07/15/2013  .  Micro Results No results found for this or any previous visit (from the past 240 hour(s)).      Code Status Orders        Start     Ordered   02/27/15 1418  Full code   Continuous     02/27/15 1419    Code Status History    Date Active Date Inactive Code Status Order ID Comments User Context   This patient has a current code status but no historical code status.  Follow-up Information    Follow up with SPARKS,JEFFREY D, MD In 7 days.   Specialty:  Internal Medicine   Contact information:   [place] [address] [phone]       Discharge Medications     Medication List    TAKE these medications        amantadine 100 MG capsule  Commonly known as:  SYMMETREL  Take 100 mg by mouth 2 (two) times daily.     aspirin EC 81 MG tablet  Take 81 mg by mouth daily.     AZILECT 1 MG Tabs tablet  Generic drug:  rasagiline  Take 1 mg by mouth at bedtime.     butalbital-aspirin-caffeine 50-325-40 MG capsule  Commonly known as:  FIORINAL  Take 1 capsule by mouth 4 (four) times daily as needed for migraine.     carbidopa-levodopa 50-200 MG tablet  Commonly known as:  SINEMET CR  Take  0.5-1.5 tablets by mouth 3 (three) times daily. Pt takes one and one-half tablet at 7am, one-half tablet at 11am, and one tablet at 2pm.     glimepiride 2 MG tablet  Commonly known as:  AMARYL  Take 1 mg by mouth 2 (two) times daily.     linagliptin 5 MG Tabs tablet  Commonly known as:  TRADJENTA  Take 5 mg by mouth daily.     metFORMIN 1000 MG tablet  Commonly known as:  GLUCOPHAGE  Take 1,000 mg by mouth 2 (two) times daily.     nystatin 100000 UNIT/ML suspension  Commonly known as:  MYCOSTATIN  Use as directed 5 mLs (500,000 Units total) in the mouth or throat 4 (four) times daily.     ropinirole 5 MG tablet  Commonly known as:  REQUIP  Take 10 mg by mouth at bedtime. Pt takes with one '4mg'$  tablet.     rOPINIRole 4 MG tablet  Commonly known as:  REQUIP  Take 4 mg by mouth at bedtime. Pt takes with two '5mg'$  tablets.     simvastatin 40 MG tablet  Commonly known as:  ZOCOR  Take 40 mg by mouth at bedtime.     tamsulosin 0.4 MG Caps capsule  Commonly known as:  FLOMAX  Take 0.4 mg by mouth daily.           Total Time in preparing paper work, data evaluation and todays exam - 35 minutes  Dustin Flock M.D on 02/28/2015 at 2:41 PM  [facility]   Office  [phone]

## 2015-02-28 NOTE — Progress Notes (Signed)
Pt being discharged today, discharge instructions reviewed with pt and family, they verbalized understanding. IV removed, pt belongings returned and pt rolled out in wheelchair by staff.

## 2015-02-28 NOTE — Plan of Care (Addendum)
-  VSS, afebrile, no c/o pain  -IVF infusing as ordered -Neuro Q4H, at baseline per family  -Carotid US today  Problem: Health Behavior/Discharge Planning: Goal: Ability to manage health-related needs will improve Outcome: Progressing PT Eval today

## 2015-02-28 NOTE — Discharge Instructions (Signed)
°  DIET:  Cardiac diet  DISCHARGE CONDITION:  Stable  ACTIVITY:  Activity as tolerated  OXYGEN:  Home Oxygen: No.   Oxygen Delivery: room air  DISCHARGE LOCATION:  home    ADDITIONAL DISCHARGE INSTRUCTION:pt primary care to set up OUTPATIENT PT   If you experience worsening of your admission symptoms, develop shortness of breath, life threatening emergency, suicidal or homicidal thoughts you must seek medical attention immediately by calling 911 or calling your MD immediately  if symptoms less severe.  You Must read complete instructions/literature along with all the possible adverse reactions/side effects for all the Medicines you take and that have been prescribed to you. Take any new Medicines after you have completely understood and accpet all the possible adverse reactions/side effects.   Please note  You were cared for by a hospitalist during your hospital stay. If you have any questions about your discharge medications or the care you received while you were in the hospital after you are discharged, you can call the unit and asked to speak with the hospitalist on call if the hospitalist that took care of you is not available. Once you are discharged, your primary care physician will handle any further medical issues. Please note that NO REFILLS for any discharge medications will be authorized once you are discharged, as it is imperative that you return to your primary care physician (or establish a relationship with a primary care physician if you do not have one) for your aftercare needs so that they can reassess your need for medications and monitor your lab values.

## 2015-09-10 ENCOUNTER — Encounter: Payer: Self-pay | Admitting: Emergency Medicine

## 2015-09-10 ENCOUNTER — Emergency Department
Admission: EM | Admit: 2015-09-10 | Discharge: 2015-09-10 | Disposition: A | Discharge status: Home / Self Care | Payer: Medicare Other | Attending: Emergency Medicine | Admitting: Emergency Medicine

## 2015-09-10 ENCOUNTER — Emergency Department: Payer: Medicare Other

## 2015-09-10 DIAGNOSIS — R918 Other nonspecific abnormal finding of lung field: Secondary | ICD-10-CM

## 2015-09-10 DIAGNOSIS — W01198A Fall on same level from slipping, tripping and stumbling with subsequent striking against other object, initial encounter: Secondary | ICD-10-CM | POA: Diagnosis not present

## 2015-09-10 DIAGNOSIS — Z79899 Other long term (current) drug therapy: Secondary | ICD-10-CM | POA: Insufficient documentation

## 2015-09-10 DIAGNOSIS — I2510 Atherosclerotic heart disease of native coronary artery without angina pectoris: Secondary | ICD-10-CM | POA: Insufficient documentation

## 2015-09-10 DIAGNOSIS — J0100 Acute maxillary sinusitis, unspecified: Secondary | ICD-10-CM

## 2015-09-10 DIAGNOSIS — Z87891 Personal history of nicotine dependence: Secondary | ICD-10-CM | POA: Insufficient documentation

## 2015-09-10 DIAGNOSIS — G20 Parkinson's disease: Secondary | ICD-10-CM | POA: Diagnosis not present

## 2015-09-10 DIAGNOSIS — Y92000 Kitchen of unspecified non-institutional (private) residence as  the place of occurrence of the external cause: Secondary | ICD-10-CM | POA: Diagnosis not present

## 2015-09-10 DIAGNOSIS — S0990XA Unspecified injury of head, initial encounter: Secondary | ICD-10-CM | POA: Diagnosis present

## 2015-09-10 DIAGNOSIS — Y991 Military activity: Secondary | ICD-10-CM | POA: Diagnosis not present

## 2015-09-10 DIAGNOSIS — Y9301 Activity, walking, marching and hiking: Secondary | ICD-10-CM | POA: Insufficient documentation

## 2015-09-10 DIAGNOSIS — E119 Type 2 diabetes mellitus without complications: Secondary | ICD-10-CM | POA: Diagnosis not present

## 2015-09-10 DIAGNOSIS — I10 Essential (primary) hypertension: Secondary | ICD-10-CM | POA: Insufficient documentation

## 2015-09-10 DIAGNOSIS — S01111A Laceration without foreign body of right eyelid and periocular area, initial encounter: Secondary | ICD-10-CM | POA: Insufficient documentation

## 2015-09-10 DIAGNOSIS — Z7982 Long term (current) use of aspirin: Secondary | ICD-10-CM | POA: Insufficient documentation

## 2015-09-10 DIAGNOSIS — Z9861 Coronary angioplasty status: Secondary | ICD-10-CM | POA: Diagnosis not present

## 2015-09-10 DIAGNOSIS — Z85828 Personal history of other malignant neoplasm of skin: Secondary | ICD-10-CM | POA: Insufficient documentation

## 2015-09-10 DIAGNOSIS — S0181XA Laceration without foreign body of other part of head, initial encounter: Secondary | ICD-10-CM

## 2015-09-10 DIAGNOSIS — Z7984 Long term (current) use of oral hypoglycemic drugs: Secondary | ICD-10-CM | POA: Diagnosis not present

## 2015-09-10 LAB — BASIC METABOLIC PANEL
Anion gap: 8 (ref 5–15)
BUN: 18 mg/dL (ref 6–20)
CALCIUM: 9.3 mg/dL (ref 8.9–10.3)
CO2: 27 mmol/L (ref 22–32)
CREATININE: 1.21 mg/dL (ref 0.61–1.24)
Chloride: 102 mmol/L (ref 101–111)
GFR calc Af Amer: >60 mL/min (ref >60)
GFR calc non Af Amer: 55 mL/min — ABNORMAL LOW (ref >60)
GLUCOSE: 140 mg/dL — AB (ref 65–99)
Potassium: 3.7 mmol/L (ref 3.5–5.1)
Sodium: 137 mmol/L (ref 135–145)

## 2015-09-10 LAB — CBC
HCT: 36.7 % — ABNORMAL LOW (ref 40.0–52.0)
HEMOGLOBIN: 12.3 g/dL — AB (ref 13.0–18.0)
MCH: 32.8 pg (ref 26.0–34.0)
MCHC: 33.4 g/dL (ref 32.0–36.0)
MCV: 98.2 fL (ref 80.0–100.0)
PLATELETS: 206 10*3/uL (ref 150–440)
RBC: 3.74 MIL/uL — ABNORMAL LOW (ref 4.40–5.90)
RDW: 13.2 % (ref 11.5–14.5)
WBC: 8.9 10*3/uL (ref 3.8–10.6)

## 2015-09-10 MED ORDER — AMOXICILLIN-POT CLAVULANATE 875-125 MG PO TABS: 1.0000 | ORAL_TABLET | Freq: Two times a day (BID) | ORAL | 0 refills | Status: DC

## 2015-09-10 MED ORDER — TETRACAINE HCL 0.5 % OP SOLN
2.0000 [drp] | Freq: Once | OPHTHALMIC | Status: AC
  Administered 2015-09-10: 2 [drp] via OPHTHALMIC
  Filled 2015-09-10: qty 2

## 2015-09-10 MED ORDER — FLUORESCEIN SODIUM 1 MG OP STRP
1.0000 | ORAL_STRIP | Freq: Once | OPHTHALMIC | Status: AC
  Administered 2015-09-10: 1 via OPHTHALMIC
  Filled 2015-09-10: qty 1

## 2015-09-10 MED ORDER — AMOXICILLIN-POT CLAVULANATE 875-125 MG PO TABS
1.0000 | ORAL_TABLET | Freq: Once | ORAL | Status: AC
  Administered 2015-09-10: 1 via ORAL
  Filled 2015-09-10: qty 1

## 2015-09-10 MED ORDER — IOPAMIDOL (ISOVUE-300) INJECTION 61%
75.0000 mL | Freq: Once | INTRAVENOUS | Status: AC | PRN
  Administered 2015-09-10: 75 mL via INTRAVENOUS

## 2015-09-10 NOTE — ED Triage Notes (Addendum)
Pt presents to ED c/o unwitnessed fall from home due to tripping over items. Pt landed on his face. Presents with laceration over right eye. Pt takes aspirin 81 mg daily. A/O x3; known parkinsons. Family at bedside states pt has blacked out sometimes

## 2015-09-10 NOTE — ED Provider Notes (Signed)
[facility] Emergency Department Provider Note  ____________________________________________  Time seen: Approximately 5:00 PM  I have reviewed the triage vital signs and the nursing notes.   HISTORY  Chief Complaint Fall; Laceration (over right eye); and Near Syncope    HPI Craig Dennis. is a 82 y.o. male brought to the ED after a fall today.he was walking in the kitchen to get some food and he reports that he tripped. He is artery carrying things in both hands was unable to catch himself from falling, so he fell on the floor and hit the right side of his face on the ground. He reports he is wearing his glasses and thinks that the broken the glass cut his right face during the incident. Denies any eye pain or vision changes. No loss of consciousness. No neck pain. No other acute pains. No chest pain or shortness of breath. No recent fever, no urinary symptoms. Has had runny nose and sinus pressure for the past 3 or 4 days. Has a history of vagal episodes and recurrent syncope, followed by neurology Craig Dennis    Past Medical History:  Diagnosis Date  . BPH (benign prostatic hyperplasia)   . CAD (coronary artery disease)   . Cancer ([place])    skin  . Colon polyps   . Diabetes ([place])   . Dyslipidemia   . Erectile dysfunction   . Gait disorder   . Heart murmur   . History of chest pain   . History of headache    chronic, recurrent, postconcussive, after fall from a collapsed deck, had been treated wtih prn fiorinal  . HOH (hard of hearing)   . Hypertension   . Myocardial infarction ([place])   . Parkinson's disease    Primarily right sided features  . Parkinson's disease ([place])   . Plantar fasciitis      Patient Active Problem List   Diagnosis Date Noted  . TIA (transient ischemic attack) 02/27/2015  . Skin erythema 06/05/2013  . Aortic stenosis 05/08/2013  . CAD S/P percutaneous coronary angioplasty 05/03/2013  . HTN (hypertension) 05/03/2013  .  Type 2 diabetes mellitus with vascular disease ([place]) 05/03/2013  . HLD (hyperlipidemia) 05/03/2013  . Parkinson disease ([place]) 05/03/2013  . Chronic headache 05/03/2013  . Abnormality of gait 03/21/2012  . Paralysis agitans ([place]) 03/21/2012     Past Surgical History:  Procedure Laterality Date  . cardicac stent    . CATARACT EXTRACTION W/PHACO Right 06/28/2014   Procedure: CATARACT EXTRACTION PHACO AND INTRAOCULAR LENS PLACEMENT (IOC);  Surgeon: Lyla Glassing, MD;  Location: [facility] ORS;  Service: Ophthalmology;  Laterality: Right;  [place]    1:07.4               AP     12.6             CDE   8.50  . CORONARY ANGIOPLASTY     stent  . HEMORRHOID SURGERY    . TONSILLECTOMY AND ADENOIDECTOMY       Prior to Admission medications   Medication Sig Start Date End Date Taking? Authorizing Provider  amantadine (SYMMETREL) 100 MG capsule Take 100 mg by mouth 2 (two) times daily.   Yes Historical Provider, MD  aspirin EC 81 MG tablet Take 81 mg by mouth daily.   Yes Historical Provider, MD  butalbital-aspirin-caffeine [facility]) 50-325-40 MG capsule Take 1 capsule by mouth 4 (four) times daily as needed for migraine.   Yes Historical Provider, MD  carbidopa-levodopa (SINEMET CR) 50-200 MG per tablet Take 2 tablets by mouth 3 (three) times daily. Pt takes 2 tablet at 7am, 2 tablets at 12 pm, and one tablet at 2pm.   Yes Historical Provider, MD  glimepiride (AMARYL) 2 MG tablet Take 1 mg by mouth 2 (two) times daily.    Yes Historical Provider, MD  linagliptin (TRADJENTA) 5 MG TABS tablet Take 5 mg by mouth daily.   Yes Historical Provider, MD  metFORMIN (GLUCOPHAGE) 1000 MG tablet Take 1,000 mg by mouth 2 (two) times daily.    Yes Historical Provider, MD  rasagiline (AZILECT) 1 MG TABS tablet Take 1 mg by mouth at bedtime.    Yes Historical Provider, MD  rOPINIRole (REQUIP) 4 MG tablet Take 4 mg by mouth at bedtime. Pt takes with two '5mg'$  tablets.   Yes Historical Provider, MD  simvastatin (ZOCOR) 40 MG  tablet Take 40 mg by mouth at bedtime.   Yes Historical Provider, MD  tamsulosin (FLOMAX) 0.4 MG CAPS capsule Take 0.8 mg by mouth daily at 12 noon. At 12 noon.   Yes Historical Provider, MD  amoxicillin-clavulanate (AUGMENTIN) 875-125 MG tablet Take 1 tablet by mouth 2 (two) times daily. 09/10/15   Carrie Mew, MD     Allergies Review of patient's allergies indicates no known allergies.   Family History  Problem Relation Age of Onset  . Heart disease Mother   . Heart attack Mother   . Diabetes Mother   . Tremor Mother     Benign essential tremor, but not parkinson's disease  . Prostate cancer Father   . Heart disease Father     Social History Social History  Substance Use Topics  . Smoking status: Former Smoker    Quit date: 02/03/1955  . Smokeless tobacco: Never Used  . Alcohol use No    Review of Systems  Constitutional:   No fever or chills.  ENT:   No sore throat. Positive rhinorrhea. Cardiovascular:   No chest pain. Respiratory:   No dyspnea positive nonproductive cough. Gastrointestinal:   Negative for abdominal pain, vomiting and diarrhea.  Genitourinary:   Negative for dysuria or difficulty urinating. Musculoskeletal:   Negative for focal pain or swelling Neurological:   Positive sinus pressures 10-point ROS otherwise negative.  ____________________________________________   PHYSICAL EXAM:  VITAL SIGNS: ED Triage Vitals  Enc Vitals Group     BP 09/10/15 1322 102/65     Pulse Rate 09/10/15 1322 78     Resp 09/10/15 1322 20     Temp 09/10/15 1322 98.2 F (36.8 C)     Temp Source 09/10/15 1322 Oral     SpO2 09/10/15 1322 96 %     Weight 09/10/15 1322 197 lb (89.4 kg)     Height 09/10/15 1322 '6\' 1"'$  (1.854 m)     Head Circumference --      Peak Flow --      Pain Score 09/10/15 1324 5     Pain Loc --      Pain Edu? --      Excl. in [place]? --     Vital signs reviewed, nursing assessments reviewed.   Constitutional:   Alert and oriented. Well  appearing and in no distress. Eyes:   No scleral icterus. No conjunctival pallor. PERRL. EOMI.  No nystagmus. ENT   Head:   Normocephalic with 1 cm linear laceration on the right periorbital rim, hemostatic. Not gaping.   Nose:   No congestion/rhinnorhea. No septal hematoma  Mouth/Throat:   MMM, no pharyngeal erythema. No peritonsillar mass.    Neck:   No stridor. No SubQ emphysema. No meningismus.no C-spine tenderness. Full range of motion. Hematological/Lymphatic/Immunilogical:   No cervical lymphadenopathy. Cardiovascular:   RRR. Symmetric bilateral radial and DP pulses.  No murmurs.  Respiratory:   Normal respiratory effort without tachypnea nor retractions. Breath sounds are clear and equal bilaterally. No wheezes/rales/rhonchi. Gastrointestinal:   Soft and nontender. Non distended. There is no CVA tenderness.  No rebound, rigidity, or guarding. Genitourinary:   deferred Musculoskeletal:   Nontender with normal range of motion in all extremities. No joint effusions.  No lower extremity tenderness.  No edema. Neurologic:   Normal speech and language. Baseline tremor consistent with parkinsonism.  CN 2-10 normal. Motor grossly intact. No gross focal neurologic deficits are appreciated.  Skin:    Skin is warm, dry and intact. No rash noted.  No petechiae, purpura, or bullae.  ____________________________________________    LABS (pertinent positives/negatives) (all labs ordered are listed, but only abnormal results are displayed) Labs Reviewed  BASIC METABOLIC PANEL - Abnormal; Notable for the following:       Result Value   Glucose, Bld 140 (*)    GFR calc non Af Amer 55 (*)    All other components within normal limits  CBC - Abnormal; Notable for the following:    RBC 3.74 (*)    Hemoglobin 12.3 (*)    HCT 36.7 (*)    All other components within normal limits   ____________________________________________   EKG  Interpreted by me Normal sinus rhythm rate of  78, normal axis and intervals. Normal QRS ST segments and T waves.  ____________________________________________    RADIOLOGY  CT head maxillofacial and cervical spine unremarkable, does reveal acute sinusitis and the maxilla. Chest x-ray reveals a round density in the right lung, scan with IV contrast recommended CT scan chest with IV contrast shows neoplastic versus necrotic pneumonia process in the right perihilar lung.  ____________________________________________   PROCEDURES Procedures Eye examination with fluorescein staining and Wood's lamp Conjunctiva stained with fluorescein test strip and tetracaine. Examined in all directions, lids raised, no abrasions or other injuries noted. No Seidel sign. ____________________________________________   INITIAL IMPRESSION / ASSESSMENT AND PLAN / ED COURSE  Pertinent labs & imaging results that were available during my care of the patient were reviewed by me and considered in my medical decision making (see chart for details).  Patient well appearing no acute distress. Trauma workup negative Low suspicion for basilar skull fracture or eye injury.given his diabetes and finding of acute sinusitis, patient will be treated with Augmentin. This will also provide a good test of the finding and the right chest, as CT suggested as neoplastic versus infectious. We'll have him follow up closely with Dr. Doy Hutching his primary care doctor who can evaluate his response to Augmentin as far as the pulmonary process goes to determine any further workup that may be needed.  Low suspicion for ACS TAD PE. No evidence of meningitis encephalitis stroke or intracranial hemorrhage.     Clinical Course  Comment By Time  Initial CT scans consistent with acute sinusitis. Because of diabetes we will treat this with antibiotics. Chest x-ray shows no acute findings, but shows a rounded density.  Will get CT scan of chest for further eval.  Carrie Mew, MD 08/08  1916   ____________________________________________   FINAL CLINICAL IMPRESSION(S) / ED DIAGNOSES  Final diagnoses:  Right lower lobe lung mass  Facial laceration, initial encounter  Acute maxillary sinusitis, recurrence not specified       Portions of this note were generated with dragon dictation software. Dictation errors may occur despite best attempts at proofreading.    Carrie Mew, MD 09/10/15 2031

## 2015-09-10 NOTE — ED Notes (Signed)
Patient transported to CT via stretcher.

## 2015-10-08 ENCOUNTER — Other Ambulatory Visit: Payer: Self-pay | Admitting: Internal Medicine

## 2015-10-08 DIAGNOSIS — R918 Other nonspecific abnormal finding of lung field: Secondary | ICD-10-CM

## 2015-10-21 ENCOUNTER — Ambulatory Visit
Admission: RE | Admit: 2015-10-21 | Discharge: 2015-10-21 | Disposition: A | Discharge status: Home / Self Care | Payer: Medicare Other | Source: Ambulatory Visit | Attending: Internal Medicine | Admitting: Internal Medicine

## 2015-10-21 DIAGNOSIS — I700 Atherosclerosis of aorta: Secondary | ICD-10-CM | POA: Insufficient documentation

## 2015-10-21 DIAGNOSIS — R918 Other nonspecific abnormal finding of lung field: Secondary | ICD-10-CM

## 2015-10-21 DIAGNOSIS — R591 Generalized enlarged lymph nodes: Secondary | ICD-10-CM | POA: Diagnosis not present

## 2015-10-21 DIAGNOSIS — J439 Emphysema, unspecified: Secondary | ICD-10-CM | POA: Insufficient documentation

## 2015-10-21 DIAGNOSIS — I2510 Atherosclerotic heart disease of native coronary artery without angina pectoris: Secondary | ICD-10-CM | POA: Diagnosis not present

## 2015-10-21 DIAGNOSIS — E278 Other specified disorders of adrenal gland: Secondary | ICD-10-CM | POA: Diagnosis not present

## 2015-10-21 MED ORDER — IOPAMIDOL (ISOVUE-300) INJECTION 61%
75.0000 mL | Freq: Once | INTRAVENOUS | Status: AC | PRN
  Administered 2015-10-21: 75 mL via INTRAVENOUS

## 2015-10-22 ENCOUNTER — Other Ambulatory Visit: Payer: Self-pay | Admitting: Internal Medicine

## 2015-10-22 DIAGNOSIS — R918 Other nonspecific abnormal finding of lung field: Secondary | ICD-10-CM

## 2015-10-24 DIAGNOSIS — C3491 Malignant neoplasm of unspecified part of right bronchus or lung: Secondary | ICD-10-CM | POA: Insufficient documentation

## 2015-10-24 NOTE — Progress Notes (Signed)
[place]  [phone] [phone]  ID: Christia Reading. OB: March 19, 1933  MR#: [id number]  OEU#:[id number]  Patient Care Team: Idelle Crouch, MD as PCP - General (Internal Medicine)  CHIEF COMPLAINT: Right lower lobe lung mass  INTERVAL HISTORY: Patient is an 82 year old male who presented to the emergency room in early August after a fall. Subsequent workup included chest x-ray and CT scan which revealed a right lower lobe mass highly suspicious for underlying malignancy. Repeat CT scan approximate 1 month later on October 21, 2015 revealed continued enlargement of the suspicious mass. Currently, he feels well and is asymptomatic. He is no neurologic complaints. He denies any recent fevers. He has good appetite and denies weight loss. He denies any chest pain, shortness of breath, cough, or hemoptysis. He has no nausea, vomiting, constipation, or diarrhea. He has no urinary complaints. Patient offers no specific complaints today.  REVIEW OF SYSTEMS:   Review of Systems  Constitutional: Negative.  Negative for fever, malaise/fatigue and weight loss.  Respiratory: Negative.  Negative for cough, hemoptysis and shortness of breath.   Cardiovascular: Negative.  Negative for chest pain and leg swelling.  Gastrointestinal: Negative.  Negative for abdominal pain.  Genitourinary: Negative.   Musculoskeletal: Negative.   Neurological: Negative.  Negative for sensory change, focal weakness and weakness.  Psychiatric/Behavioral: Negative.  The patient is not nervous/anxious.     As per HPI. Otherwise, a complete review of systems is negative.  PAST MEDICAL HISTORY: Past Medical History:  Diagnosis Date  . BPH (benign prostatic hyperplasia)   . CAD (coronary artery disease)   . Cancer ([employer])    skin  . Colon polyps   . Diabetes ([place])   . Dyslipidemia   . Erectile dysfunction   . Gait disorder   . Heart murmur   . History of chest pain   .  History of headache    chronic, recurrent, postconcussive, after fall from a collapsed deck, had been treated wtih prn fiorinal  . HOH (hard of hearing)   . Hypertension   . Myocardial infarction ([place])   . Parkinson's disease    Primarily right sided features  . Parkinson's disease ([place])   . Plantar fasciitis     PAST SURGICAL HISTORY: Past Surgical History:  Procedure Laterality Date  . cardicac stent    . CATARACT EXTRACTION W/PHACO Right 06/28/2014   Procedure: CATARACT EXTRACTION PHACO AND INTRAOCULAR LENS PLACEMENT (IOC);  Surgeon: Lyla Glassing, MD;  Location: [facility] ORS;  Service: Ophthalmology;  Laterality: Right;  [place]    1:07.4               AP     12.6             CDE   8.50  . CORONARY ANGIOPLASTY     stent  . HEMORRHOID SURGERY    . TONSILLECTOMY AND ADENOIDECTOMY      FAMILY HISTORY: Family History  Problem Relation Age of Onset  . Heart disease Mother   . Heart attack Mother   . Diabetes Mother   . Tremor Mother     Benign essential tremor, but not parkinson's disease  . Prostate cancer Father   . Heart disease Father     ADVANCED DIRECTIVES (Y/N):  N  HEALTH MAINTENANCE: Social History  Substance Use Topics  . Smoking status: Former Smoker    Quit date: 02/03/1955  . Smokeless tobacco: Never Used  . Alcohol use No  Colonoscopy:  PAP:  Bone density:  Lipid panel:  No Known Allergies  Current Outpatient Prescriptions  Medication Sig Dispense Refill  . ALPRAZolam (XANAX) 0.25 MG tablet Take 0.25 mg by mouth at bedtime as needed for anxiety.    Marland Kitchen amantadine (SYMMETREL) 100 MG capsule Take 100 mg by mouth 2 (two) times daily.    Marland Kitchen aspirin EC 81 MG tablet Take 81 mg by mouth daily.    . butalbital-aspirin-caffeine (FIORINAL) 50-325-40 MG capsule Take 1 capsule by mouth 4 (four) times daily as needed for migraine.    . carbidopa-levodopa (SINEMET CR) 50-200 MG per tablet Take 7 tablets by mouth daily. Pt takes 2 tablet at 7am, 2 tablets at 12 pm, and  one tablet at 2pm.    . donepezil (ARICEPT) 5 MG tablet Take 5 mg by mouth daily.    . fludrocortisone (FLORINEF) 0.1 MG tablet Take 0.1 mg by mouth 2 (two) times daily.    Marland Kitchen glimepiride (AMARYL) 2 MG tablet Take 1 mg by mouth 2 (two) times daily.     Marland Kitchen linagliptin (TRADJENTA) 5 MG TABS tablet Take 5 mg by mouth daily.    . metFORMIN (GLUCOPHAGE) 1000 MG tablet Take 1,000 mg by mouth 2 (two) times daily.     . rasagiline (AZILECT) 1 MG TABS tablet Take 1 mg by mouth at bedtime.     Marland Kitchen rOPINIRole (REQUIP) 4 MG tablet Take 14 mg by mouth at bedtime. Pt takes with two '5mg'$  tablets.    . simvastatin (ZOCOR) 40 MG tablet Take 40 mg by mouth at bedtime.    . tamsulosin (FLOMAX) 0.4 MG CAPS capsule Take 0.8 mg by mouth daily at 12 noon. At 12 noon.     No current facility-administered medications for this visit.     OBJECTIVE: Vitals:   10/25/15 0844  BP: (!) 95/59  Pulse: 76  Resp: 18  Temp: 98.5 F (36.9 C)     Body mass index is 26.74 kg/m.    ECOG FS:0 - Asymptomatic  General: Well-developed, well-nourished, no acute distress. Sitting in a wheelchair.  Eyes: Pink conjunctiva, anicteric sclera. HEENT: Normocephalic, moist mucous membranes, clear oropharnyx. Lungs: Clear to auscultation bilaterally. Heart: Regular rate and rhythm. No rubs, murmurs, or gallops. Abdomen: Soft, nontender, nondistended. No organomegaly noted, normoactive bowel sounds. Musculoskeletal: No edema, cyanosis, or clubbing. Neuro: Alert, answering all questions appropriately. Cranial nerves grossly intact. Skin: No rashes or petechiae noted. Psych: Normal affect. Lymphatics: No cervical, calvicular, axillary or inguinal LAD.   LAB RESULTS:  Lab Results  Component Value Date   NA 137 09/10/2015   K 3.7 09/10/2015   CL 102 09/10/2015   CO2 27 09/10/2015   GLUCOSE 140 (H) 09/10/2015   BUN 18 09/10/2015   CREATININE 1.21 09/10/2015   CALCIUM 9.3 09/10/2015   PROT 7.1 07/15/2013   ALBUMIN 3.8 07/15/2013     AST 16 07/15/2013   ALT 10 (L) 07/15/2013   ALKPHOS 61 07/15/2013   BILITOT 0.3 07/15/2013   GFRNONAA 55 (L) 09/10/2015   GFRAA >60 09/10/2015    Lab Results  Component Value Date   WBC 8.9 09/10/2015   HGB 12.3 (L) 09/10/2015   HCT 36.7 (L) 09/10/2015   MCV 98.2 09/10/2015   PLT 206 09/10/2015     STUDIES: Ct Chest W Contrast  Result Date: 10/21/2015 CLINICAL DATA:  Follow-up necrotic right lower lobe mass versus cavitary pneumonia. Former smoker. Patient denies new chest complaints. EXAM: CT CHEST WITH CONTRAST TECHNIQUE:  Multidetector CT imaging of the chest was performed during intravenous contrast administration. CONTRAST:  88m ISOVUE-300 IOPAMIDOL (ISOVUE-300) INJECTION 61% COMPARISON:  Radiographs 07/15/2013 and 09/10/2015.  CT 09/20/2015. FINDINGS: Cardiovascular: Suboptimal contrast bolus. Limited luminal evaluation of the right lower lobe pulmonary artery. There is diffuse atherosclerosis of the aorta, great vessels and coronary arteries. Aortic valvular calcifications are present. The heart size is normal. There is no pericardial effusion. Mediastinum/Nodes: There is persistent subcarinal and right hilar adenopathy. Subcarinal nodes measure 13 mm short axis on image 63 and 15 mm on image 69. There is confluent right hilar adenopathy, difficult to differentiate from the poorly opacified pulmonary vessels. The thyroid gland, trachea and esophagus demonstrate no significant findings. Lungs/Pleura: There is no pleural effusion. Centrilobular and paraseptal emphysema again noted with subpleural reticulation and scattered parenchymal scarring. There is perifissural nodularity in both lungs. The dominant, partially cavitary mass in the superior segment of the right lower lobe has slightly enlarged, measuring 5.4 x 4.1 cm on image 68. Upper abdomen: There are bilateral adrenal masses worrisome for metastatic disease. These measure 5.2 x 3.5 cm on the left (image 146) and 3.0 x 1.9 cm on  the right (image 154). The adrenal glands were not fully imaged previously. There is a calcified gallstone. No definite hepatic abnormality, although hepatic evaluation limited by suboptimal contrast bolus. Musculoskeletal/Chest wall: There is no chest wall mass or suspicious osseous finding. Old left-sided rib fractures are noted. There is posttraumatic deformity of the left clavicle. IMPRESSION: 1. Progressive enlargement of cavitary right lower lobe mass highly worrisome for squamous cell carcinoma. 2. Associated right hilar and subcarinal adenopathy consistent with metastatic disease. Bilateral adrenal masses are also worrisome for metastatic disease. Hepatic evaluation limited. 3. Underlying chronic lung disease with emphysema and subpleural reticulation. 4. Diffuse atherosclerosis. Aortic Atherosclerosis (ICD10-170.0). Aortic valvular calcifications. 5. Tissue sampling of the right lower lobe mass and/or PET-CT recommended for further evaluation. Electronically Signed   By: WRichardean SaleM.D.   On: 10/21/2015 15:59    ASSESSMENT: Right lower lobe lung mass  PLAN:    1. Right lower lobe lung mass: Highly suspicious for underlying malignancy. Patient has a PET scan scheduled for October 29, 2015 for further evaluation. Have also given a patient referral to pulmonology for consideration of bronchoscopy and biopsy. CT of the head on September 10, 2015 did not reveal metastatic disease, but patient will likely need MRI for further evaluation. Return to clinic in 2 weeks for further evaluation, discussion of his diagnostic results, and treatment planning.  Approximately 45 minutes was spent in discussion of which greater than 50% was consultation.  Patient expressed understanding and was in agreement with this plan. He also understands that He can call clinic at any time with any questions, concerns, or complaints.   No matching staging information was found for the patient.  TLloyd Huger MD    10/29/2015 9:17 AM

## 2015-10-25 ENCOUNTER — Encounter: Payer: Self-pay | Admitting: Oncology

## 2015-10-25 ENCOUNTER — Inpatient Hospital Stay: Payer: Medicare Other | Attending: Oncology | Admitting: Oncology

## 2015-10-25 DIAGNOSIS — E785 Hyperlipidemia, unspecified: Secondary | ICD-10-CM

## 2015-10-25 DIAGNOSIS — Z79899 Other long term (current) drug therapy: Secondary | ICD-10-CM

## 2015-10-25 DIAGNOSIS — R011 Cardiac murmur, unspecified: Secondary | ICD-10-CM

## 2015-10-25 DIAGNOSIS — I2510 Atherosclerotic heart disease of native coronary artery without angina pectoris: Secondary | ICD-10-CM

## 2015-10-25 DIAGNOSIS — K8020 Calculus of gallbladder without cholecystitis without obstruction: Secondary | ICD-10-CM | POA: Diagnosis not present

## 2015-10-25 DIAGNOSIS — I252 Old myocardial infarction: Secondary | ICD-10-CM | POA: Diagnosis not present

## 2015-10-25 DIAGNOSIS — E119 Type 2 diabetes mellitus without complications: Secondary | ICD-10-CM | POA: Diagnosis not present

## 2015-10-25 DIAGNOSIS — I10 Essential (primary) hypertension: Secondary | ICD-10-CM

## 2015-10-25 DIAGNOSIS — R269 Unspecified abnormalities of gait and mobility: Secondary | ICD-10-CM

## 2015-10-25 DIAGNOSIS — Z8042 Family history of malignant neoplasm of prostate: Secondary | ICD-10-CM

## 2015-10-25 DIAGNOSIS — E278 Other specified disorders of adrenal gland: Secondary | ICD-10-CM | POA: Diagnosis not present

## 2015-10-25 DIAGNOSIS — R918 Other nonspecific abnormal finding of lung field: Secondary | ICD-10-CM | POA: Diagnosis not present

## 2015-10-25 DIAGNOSIS — R590 Localized enlarged lymph nodes: Secondary | ICD-10-CM | POA: Diagnosis not present

## 2015-10-25 DIAGNOSIS — Z86010 Personal history of colonic polyps: Secondary | ICD-10-CM

## 2015-10-25 DIAGNOSIS — Z7984 Long term (current) use of oral hypoglycemic drugs: Secondary | ICD-10-CM | POA: Diagnosis not present

## 2015-10-25 DIAGNOSIS — N400 Enlarged prostate without lower urinary tract symptoms: Secondary | ICD-10-CM

## 2015-10-25 DIAGNOSIS — Z85828 Personal history of other malignant neoplasm of skin: Secondary | ICD-10-CM | POA: Diagnosis not present

## 2015-10-25 DIAGNOSIS — N529 Male erectile dysfunction, unspecified: Secondary | ICD-10-CM

## 2015-10-25 DIAGNOSIS — G20 Parkinson's disease: Secondary | ICD-10-CM | POA: Diagnosis not present

## 2015-10-25 DIAGNOSIS — Z7982 Long term (current) use of aspirin: Secondary | ICD-10-CM | POA: Diagnosis not present

## 2015-10-25 DIAGNOSIS — Z87891 Personal history of nicotine dependence: Secondary | ICD-10-CM | POA: Diagnosis not present

## 2015-10-25 DIAGNOSIS — M722 Plantar fascial fibromatosis: Secondary | ICD-10-CM | POA: Diagnosis not present

## 2015-10-25 DIAGNOSIS — J45909 Unspecified asthma, uncomplicated: Secondary | ICD-10-CM | POA: Diagnosis not present

## 2015-10-25 DIAGNOSIS — J439 Emphysema, unspecified: Secondary | ICD-10-CM

## 2015-10-25 DIAGNOSIS — I700 Atherosclerosis of aorta: Secondary | ICD-10-CM

## 2015-10-25 NOTE — Progress Notes (Signed)
New evaluation for lung mass. Offers no complaints. Family present with patient.

## 2015-10-29 ENCOUNTER — Ambulatory Visit
Admission: RE | Admit: 2015-10-29 | Discharge: 2015-10-29 | Disposition: A | Discharge status: Home / Self Care | Payer: Medicare Other | Source: Ambulatory Visit | Attending: Internal Medicine | Admitting: Internal Medicine

## 2015-10-29 DIAGNOSIS — J439 Emphysema, unspecified: Secondary | ICD-10-CM | POA: Diagnosis not present

## 2015-10-29 DIAGNOSIS — C7972 Secondary malignant neoplasm of left adrenal gland: Secondary | ICD-10-CM | POA: Insufficient documentation

## 2015-10-29 DIAGNOSIS — I700 Atherosclerosis of aorta: Secondary | ICD-10-CM | POA: Diagnosis not present

## 2015-10-29 DIAGNOSIS — C772 Secondary and unspecified malignant neoplasm of intra-abdominal lymph nodes: Secondary | ICD-10-CM | POA: Insufficient documentation

## 2015-10-29 DIAGNOSIS — C7971 Secondary malignant neoplasm of right adrenal gland: Secondary | ICD-10-CM | POA: Insufficient documentation

## 2015-10-29 DIAGNOSIS — K4090 Unilateral inguinal hernia, without obstruction or gangrene, not specified as recurrent: Secondary | ICD-10-CM | POA: Insufficient documentation

## 2015-10-29 DIAGNOSIS — I2510 Atherosclerotic heart disease of native coronary artery without angina pectoris: Secondary | ICD-10-CM | POA: Insufficient documentation

## 2015-10-29 DIAGNOSIS — R918 Other nonspecific abnormal finding of lung field: Secondary | ICD-10-CM | POA: Insufficient documentation

## 2015-10-29 DIAGNOSIS — J90 Pleural effusion, not elsewhere classified: Secondary | ICD-10-CM | POA: Insufficient documentation

## 2015-10-29 DIAGNOSIS — N200 Calculus of kidney: Secondary | ICD-10-CM | POA: Insufficient documentation

## 2015-10-29 DIAGNOSIS — K8020 Calculus of gallbladder without cholecystitis without obstruction: Secondary | ICD-10-CM | POA: Insufficient documentation

## 2015-10-29 DIAGNOSIS — C7951 Secondary malignant neoplasm of bone: Secondary | ICD-10-CM | POA: Insufficient documentation

## 2015-10-29 LAB — GLUCOSE, CAPILLARY: Glucose-Capillary: 106 mg/dL — ABNORMAL HIGH (ref 65–99)

## 2015-10-29 MED ORDER — FLUDEOXYGLUCOSE F - 18 (FDG) INJECTION
12.2900 | Freq: Once | INTRAVENOUS | Status: AC | PRN
  Administered 2015-10-29: 12.29 via INTRAVENOUS

## 2015-10-31 ENCOUNTER — Ambulatory Visit (INDEPENDENT_AMBULATORY_CARE_PROVIDER_SITE_OTHER): Payer: Medicare Other | Admitting: Internal Medicine

## 2015-10-31 ENCOUNTER — Encounter (INDEPENDENT_AMBULATORY_CARE_PROVIDER_SITE_OTHER): Payer: Self-pay

## 2015-10-31 ENCOUNTER — Encounter: Payer: Self-pay | Admitting: Internal Medicine

## 2015-10-31 VITALS — BP 110/64 | HR 76 | Ht 73.0 in | Wt 199.0 lb

## 2015-10-31 DIAGNOSIS — F0280 Dementia in other diseases classified elsewhere without behavioral disturbance: Secondary | ICD-10-CM | POA: Insufficient documentation

## 2015-10-31 DIAGNOSIS — R918 Other nonspecific abnormal finding of lung field: Secondary | ICD-10-CM | POA: Diagnosis not present

## 2015-10-31 DIAGNOSIS — G20 Parkinson's disease: Secondary | ICD-10-CM

## 2015-10-31 NOTE — Patient Instructions (Signed)
Will set up for EBUS-Bronchosocpy for lung mass

## 2015-10-31 NOTE — Progress Notes (Signed)
[facility] Consultation      Date: 10/31/2015,   MRN# [id number] Mareo Portilla 07-Jul-1933 Code Status:  Code Status History    Date Active Date Inactive Code Status Order ID Comments User Context   02/27/2015  2:19 PM 02/28/2015  5:11 PM Full Code [id number]  Dustin Flock, MD ED     Hosp day:'@LENGTHOFSTAYDAYS'$ @ Referring MD: '@ATDPROV'$ @     PCP:      AdmissionWeight: 199 lb (90.3 kg)                 CurrentWeight: 199 lb (90.3 kg) Kermitt Harjo. is a 82 y.o. old male seen in consultation for lung mass at the request of Dr. Grayland Ormond.     CHIEF COMPLAINT:  abnormal CT chest    HISTORY OF PRESENT ILLNESS   82 yo pleasant  white male seen today for abnormal CT chest Patient had fallen and went to ER, patient has h/o PD and fallen after tripping on his feet Patient had cxr that was abnormal and subsequently had CT chest which shows R lung mass  Patient smoked for 5 years but has h/o extensive second hand smoke exposure   Associated with weight loss approx 30 pounds in 8 months Patient with poor appetite as well  Patient does not have any resp issues Has occasional cough, no SOB, no DOE, no hemoptysis No signs of infection at this time  CT chest and PET scan c/w R lung mass with extensive adenopathy with bone mets pateint   PAST MEDICAL HISTORY   Past Medical History:  Diagnosis Date  . BPH (benign prostatic hyperplasia)   . CAD (coronary artery disease)   . Cancer ([place])    skin  . Colon polyps   . Diabetes ([place])   . Dyslipidemia   . Erectile dysfunction   . Gait disorder   . Heart murmur   . History of chest pain   . History of headache    chronic, recurrent, postconcussive, after fall from a collapsed deck, had been treated wtih prn fiorinal  . HOH (hard of hearing)   . Hypertension   . Myocardial infarction ([employer])   . Parkinson's disease    Primarily right sided features  . Parkinson's disease ([place])   . Plantar fasciitis       SURGICAL HISTORY   Past Surgical History:  Procedure Laterality Date  . cardicac stent    . CATARACT EXTRACTION W/PHACO Right 06/28/2014   Procedure: CATARACT EXTRACTION PHACO AND INTRAOCULAR LENS PLACEMENT (IOC);  Surgeon: Lyla Glassing, MD;  Location: [facility] ORS;  Service: Ophthalmology;  Laterality: Right;  [place]    1:07.4               AP     12.6             CDE   8.50  . CORONARY ANGIOPLASTY     stent  . HEMORRHOID SURGERY    . TONSILLECTOMY AND ADENOIDECTOMY       FAMILY HISTORY   Family History  Problem Relation Age of Onset  . Heart disease Mother   . Heart attack Mother   . Diabetes Mother   . Tremor Mother     Benign essential tremor, but not parkinson's disease  . Prostate cancer Father   . Heart disease Father      SOCIAL HISTORY   Social History  Substance Use Topics  . 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 Smokeless tobacco: Never Used  . Alcohol use No     MEDICATIONS    Home Medication:  Current Outpatient Rx  . Order #: [id number] Class: Historical Med  . Order #: [id number] Class: Historical Med  . Order #: [id number] Class: Historical Med  . Order #: [id number] Class: Historical Med  . Order #: [id number] Class: Historical Med  . Order #: [id number] Class: Historical Med  . Order #: [id number] Class: Historical Med  . Order #: [id number] Class: Historical Med  . Order #: [id number] Class: Historical Med  . Order #: [id number] Class: Historical Med  . Order #: [id number] Class: Historical Med  . Order #: [id number] Class: Historical Med  . Order #: [id number] Class: Historical Med  . Order #: [id number] Class: Historical Med    Current Medication:  Current Outpatient Prescriptions:  .  ALPRAZolam (XANAX) 0.25 MG tablet, Take 0.25 mg by mouth at bedtime as needed for anxiety., Disp: , Rfl:  .  amantadine (SYMMETREL) 100 MG capsule, Take 100 mg by mouth 2 (two) times daily., Disp: , Rfl:  .  aspirin EC 81 MG tablet, Take 81 mg by mouth daily.,  Disp: , Rfl:  .  butalbital-aspirin-caffeine (FIORINAL) 50-325-40 MG capsule, Take 1 capsule by mouth 4 (four) times daily as needed for migraine., Disp: , Rfl:  .  carbidopa-levodopa (SINEMET CR) 50-200 MG per tablet, Take 7 tablets by mouth daily. Pt takes 2 tablet at 7am, 2 tablets at 12 pm, and one tablet at 2pm., Disp: , Rfl:  .  donepezil (ARICEPT) 5 MG tablet, Take 5 mg by mouth daily., Disp: , Rfl:  .  fludrocortisone (FLORINEF) 0.1 MG tablet, Take 0.1 mg by mouth 2 (two) times daily., Disp: , Rfl:  .  glimepiride (AMARYL) 2 MG tablet, Take 1 mg by mouth 2 (two) times daily. , Disp: , Rfl:  .  linagliptin (TRADJENTA) 5 MG TABS tablet, Take 5 mg by mouth daily., Disp: , Rfl:  .  metFORMIN (GLUCOPHAGE) 1000 MG tablet, Take 1,000 mg by mouth 2 (two) times daily. , Disp: , Rfl:  .  rasagiline (AZILECT) 1 MG TABS tablet, Take 1 mg by mouth at bedtime. , Disp: , Rfl:  .  rOPINIRole (REQUIP) 4 MG tablet, Take 14 mg by mouth at bedtime. Pt takes with two '5mg'$  tablets., Disp: , Rfl:  .  simvastatin (ZOCOR) 40 MG tablet, Take 40 mg by mouth at bedtime., Disp: , Rfl:  .  tamsulosin (FLOMAX) 0.4 MG CAPS capsule, Take 0.8 mg by mouth daily at 12 noon. At 12 noon., Disp: , Rfl:     ALLERGIES   Review of patient's allergies indicates no known allergies.     REVIEW OF SYSTEMS   Review of Systems  Constitutional: Negative for chills, diaphoresis, fever, malaise/fatigue and weight loss.  HENT: Negative for congestion and hearing loss.   Eyes: Negative for blurred vision and double vision.  Respiratory: Negative for cough, hemoptysis, sputum production, shortness of breath and wheezing.   Cardiovascular: Negative for chest pain, palpitations and orthopnea.  Gastrointestinal: Negative for abdominal pain, heartburn, nausea and vomiting.  Genitourinary: Negative for dysuria and urgency.  Musculoskeletal: Positive for back pain. Negative for myalgias and neck pain.  Skin: Negative for rash.   Neurological: Negative for dizziness, tingling, tremors, focal weakness, weakness and headaches.  Endo/Heme/Allergies: Does not bruise/bleed easily.  Psychiatric/Behavioral: Negative for depression, substance abuse and suicidal ideas.  All other systems reviewed and are negative.    VS: BP 110/64 (BP Location: Left Arm, Cuff Size: Normal)  Pulse 76   Ht '6\' 1"'$  (1.854 m)   Wt 199 lb (90.3 kg)   SpO2 95%   BMI 26.25 kg/m      PHYSICAL EXAM  Physical Exam  Constitutional: He is oriented to person, place, and time. He appears well-developed and well-nourished. No distress.  HENT:  Head: Normocephalic and atraumatic.  Mouth/Throat: No oropharyngeal exudate.  Eyes: EOM are normal. Pupils are equal, round, and reactive to light. No scleral icterus.  Neck: Normal range of motion. Neck supple.  Cardiovascular: Normal rate, regular rhythm and normal heart sounds.   No murmur heard. Pulmonary/Chest: No stridor. No respiratory distress. He has no wheezes. He has rales.  Abdominal: Soft. Bowel sounds are normal.  Musculoskeletal: Normal range of motion. He exhibits no edema.  Neurological: He is alert and oriented to person, place, and time. No cranial nerve deficit.  Skin: Skin is warm. He is not diaphoretic.  Psychiatric: He has a normal mood and affect.         IMAGING    Ct Chest W Contrast  Result Date: 10/21/2015 CLINICAL DATA:  Follow-up necrotic right lower lobe mass versus cavitary pneumonia. Former smoker. Patient denies new chest complaints. EXAM: CT CHEST WITH CONTRAST TECHNIQUE: Multidetector CT imaging of the chest was performed during intravenous contrast administration. CONTRAST:  60m ISOVUE-300 IOPAMIDOL (ISOVUE-300) INJECTION 61% COMPARISON:  Radiographs 07/15/2013 and 09/10/2015.  CT 09/20/2015. FINDINGS: Cardiovascular: Suboptimal contrast bolus. Limited luminal evaluation of the right lower lobe pulmonary artery. There is diffuse atherosclerosis of the aorta,  great vessels and coronary arteries. Aortic valvular calcifications are present. The heart size is normal. There is no pericardial effusion. Mediastinum/Nodes: There is persistent subcarinal and right hilar adenopathy. Subcarinal nodes measure 13 mm short axis on image 63 and 15 mm on image 69. There is confluent right hilar adenopathy, difficult to differentiate from the poorly opacified pulmonary vessels. The thyroid gland, trachea and esophagus demonstrate no significant findings. Lungs/Pleura: There is no pleural effusion. Centrilobular and paraseptal emphysema again noted with subpleural reticulation and scattered parenchymal scarring. There is perifissural nodularity in both lungs. The dominant, partially cavitary mass in the superior segment of the right lower lobe has slightly enlarged, measuring 5.4 x 4.1 cm on image 68. Upper abdomen: There are bilateral adrenal masses worrisome for metastatic disease. These measure 5.2 x 3.5 cm on the left (image 146) and 3.0 x 1.9 cm on the right (image 154). The adrenal glands were not fully imaged previously. There is a calcified gallstone. No definite hepatic abnormality, although hepatic evaluation limited by suboptimal contrast bolus. Musculoskeletal/Chest wall: There is no chest wall mass or suspicious osseous finding. Old left-sided rib fractures are noted. There is posttraumatic deformity of the left clavicle. IMPRESSION: 1. Progressive enlargement of cavitary right lower lobe mass highly worrisome for squamous cell carcinoma. 2. Associated right hilar and subcarinal adenopathy consistent with metastatic disease. Bilateral adrenal masses are also worrisome for metastatic disease. Hepatic evaluation limited. 3. Underlying chronic lung disease with emphysema and subpleural reticulation. 4. Diffuse atherosclerosis. Aortic Atherosclerosis (ICD10-170.0). Aortic valvular calcifications. 5. Tissue sampling of the right lower lobe mass and/or PET-CT recommended for  further evaluation. Electronically Signed   By: WRichardean SaleM.D.   On: 10/21/2015 15:59   Nm Pet Image Initial (pi) Skull Base To Thigh  Result Date: 10/29/2015 CLINICAL DATA:  Initial treatment strategy for right lower lobe lung mass seen on recent chest CT studies. EXAM: NUCLEAR MEDICINE PET SKULL BASE TO THIGH TECHNIQUE: 12.3 mCi  F-18 FDG was injected intravenously. Full-ring PET imaging was performed from the skull base to thigh after the radiotracer. CT data was obtained and used for attenuation correction and anatomic localization. FASTING BLOOD GLUCOSE:  Value: 106 mg/dl COMPARISON:  10/21/2015 chest CT. FINDINGS: NECK No hypermetabolic lymph nodes in the neck. CHEST Hypermetabolic partially cavitary 5.5 x 4.3 cm posterior right lower lobe lung mass with max SUV 11.7, consistent with primary bronchogenic carcinoma. Mild centrilobular and paraseptal emphysema with diffuse bronchial wall thickening. Patchy subpleural reticulation throughout both lungs is not appreciably changed back to 09/10/2015. No acute consolidative airspace disease or new significant pulmonary nodules. Hypermetabolic right hilar adenopathy with max SUV 9.0. Hypermetabolic right subcarinal adenopathy, for example a 1.7 cm right subcarinal node with max SUV 7.9 (series 3/image 109). No hypermetabolic axillary, contralateral mediastinal, supraclavicular or contralateral hilar lymphadenopathy. Left main, left anterior descending, left circumflex and right coronary atherosclerosis. Atherosclerotic nonaneurysmal thoracic aorta. Trace bilateral pleural effusions. ABDOMEN/PELVIS Hypermetabolic right adrenal 2.9 cm mass with max SUV 10.8 (series 3/image 165). Hypermetabolic 5.2 cm left adrenal mass with max SUV 8.9 (series 3/image 158). Enlarged hypermetabolic 1.3 cm left para-aortic node with max SUV 5.2 (series 3/image 176). No abnormal hypermetabolic activity within the liver, pancreas, or spleen. No hypermetabolic lymph nodes in the  pelvis. Cholelithiasis . Atherosclerotic nonaneurysmal abdominal aorta. Nonobstructing 3 mm upper left renal stone. Small to moderate right inguinal hernia containing a right pelvic small bowel loop, with no small bowel dilatation, small bowel wall thickening or small bowel pneumatosis. No free intraperitoneal air. No intraperitoneal fluid collections. SKELETON There is focal hypermetabolism in the left C1 lateral mass with the suggestion of asymmetric sclerosis in this location with max SUV 7.1, most consistent with a bone metastasis. Focal hypermetabolism in the medial left iliac bone with max SUV 4.7, without discrete bone lesion on the CT images in this location, suspicious for a bone metastasis. Mildly expansile hypermetabolic mixed lytic and sclerotic left posterior sixth rib metastasis with max SUV 10.5. IMPRESSION: 1. Hypermetabolic 5.5 cm posterior right lower lobe lung mass, consistent with primary bronchogenic carcinoma, probably squamous cell carcinoma given the cavitation. Trace bilateral pleural effusions. 2. Hypermetabolic right hilar and subcarinal nodal metastases. 3. Hypermetabolic bilateral adrenal metastases. 4. Hypermetabolic left para-aortic retroperitoneal nodal metastasis. 5. Hypermetabolic left posterior sixth rib, left lateral C1 mass and medial left iliac bone metastases. 6. Small to moderate right inguinal hernia containing a right pelvic small bowel loop, with no evidence of bowel obstruction or ischemia. 7. Additional findings include mild emphysema, aortic atherosclerosis, left main and 3 vessel coronary atherosclerosis, cholelithiasis and nonobstructing left renal stone. Electronically Signed   By: Ilona Sorrel M.D.   On: 10/29/2015 15:34     Images reviewed 10/31/2015 RLL mass with extensive adenopathy and bone mets   ASSESSMENT/PLAN   82 yo white male with high suspicion lung cancer with RT lung mass c/w Primary Lung cancer with extensive mediastinal adenopathy and Bone  Mets. Findings c/w Stage 4 Cancer  Prognosis is very poor   The Risks and Benefits of the Bronchoscopy with EBUS were explained to patient/family and I have discussed the risk for acute bleeding, increased chance of infection, increased chance of respiratory failure and cardiac arrest and death. I have also explained to avoid all types of NSAIDs to decrease chance of bleeding, and to avoid food and drinks the midnight prior to procedure.  The patient/family understand the risks and benefits and have agreed to proceed with procedure.  Will  schedule as soon as possible   I have personally obtained a history, examined the patient, evaluated laboratory and independently reviewed imaging results, formulated the assessment and plan and placed orders.  The Patient requires high complexity decision making for assessment and support, frequent evaluation and titration of therapies, application of advanced monitoring technologies and extensive interpretation of multiple databases.     Patient/Family are satisfied with Plan of action and management. All questions answered  Corrin Parker, M.D.  [facility]  Medical Director [place] Director [facility]

## 2015-11-06 NOTE — Progress Notes (Signed)
[place]  Telephone:([phone] [phone]  ID: Craig Dennis. OB: Jan 02, 1934  MR#: [id number]  OXB#:[id number]  Patient Care Team: Idelle Crouch, MD as PCP - General (Internal Medicine)  CHIEF COMPLAINT: Right lower lobe lung mass  INTERVAL HISTORY: Patient returns to clinic today for discussion of his imaging results and further diagnostic testing. Currently, he feels well and is asymptomatic. He has no neurologic complaints. He denies any recent fevers. He has good appetite and denies weight loss. He denies any chest pain, shortness of breath, cough, or hemoptysis. He has no nausea, vomiting, constipation, or diarrhea. He has no urinary complaints. Patient offers no specific complaints today.  REVIEW OF SYSTEMS:   Review of Systems  Constitutional: Negative.  Negative for fever, malaise/fatigue and weight loss.  Respiratory: Negative.  Negative for cough, hemoptysis and shortness of breath.   Cardiovascular: Negative.  Negative for chest pain and leg swelling.  Gastrointestinal: Negative.  Negative for abdominal pain.  Genitourinary: Negative.   Musculoskeletal: Negative.   Neurological: Negative.  Negative for sensory change, focal weakness and weakness.  Psychiatric/Behavioral: Negative.  The patient is not nervous/anxious.     As per HPI. Otherwise, a complete review of systems is negative.  PAST MEDICAL HISTORY: Past Medical History:  Diagnosis Date  . BPH (benign prostatic hyperplasia)   . CAD (coronary artery disease)   . Cancer ([place])    skin  . Colon polyps   . Diabetes ([place])   . Dyslipidemia   . Erectile dysfunction   . Gait disorder   . Heart murmur   . History of chest pain   . History of headache    chronic, recurrent, postconcussive, after fall from a collapsed deck, had been treated wtih prn fiorinal  . HOH (hard of hearing)   . Hypertension   . Myocardial infarction   . Parkinson's disease    Primarily right sided  features  . Parkinson's disease ([place])   . Plantar fasciitis     PAST SURGICAL HISTORY: Past Surgical History:  Procedure Laterality Date  . cardicac stent    . CATARACT EXTRACTION W/PHACO Right 06/28/2014   Procedure: CATARACT EXTRACTION PHACO AND INTRAOCULAR LENS PLACEMENT (IOC);  Surgeon: Lyla Glassing, MD;  Location: [facility] ORS;  Service: Ophthalmology;  Laterality: Right;  [place]    1:07.4               AP     12.6             CDE   8.50  . CORONARY ANGIOPLASTY     stent  . HEMORRHOID SURGERY    . TONSILLECTOMY AND ADENOIDECTOMY      FAMILY HISTORY: Family History  Problem Relation Age of Onset  . Heart disease Mother   . Heart attack Mother   . Diabetes Mother   . Tremor Mother     Benign essential tremor, but not parkinson's disease  . Prostate cancer Father   . Heart disease Father     ADVANCED DIRECTIVES (Y/N):  N  HEALTH MAINTENANCE: Social History  Substance Use Topics  . Smoking status: Former Smoker    Quit date: 02/03/1955  . Smokeless tobacco: Never Used  . Alcohol use No     Colonoscopy:  PAP:  Bone density:  Lipid panel:  No Known Allergies  Current Outpatient Prescriptions  Medication Sig Dispense Refill  . ALPRAZolam (XANAX) 0.25 MG tablet Take 0.25 mg by mouth at bedtime as needed for anxiety.    Marland Kitchen  amantadine (SYMMETREL) 100 MG capsule Take 100 mg by mouth 2 (two) times daily.    Marland Kitchen aspirin EC 81 MG tablet Take 81 mg by mouth daily.    . butalbital-aspirin-caffeine (FIORINAL) 50-325-40 MG capsule Take 1 capsule by mouth 4 (four) times daily as needed for migraine.    . carbidopa-levodopa (SINEMET CR) 50-200 MG per tablet Take 7 tablets by mouth daily. Pt takes 2 tablet at 7am, 2 tablets at 12 pm, and one tablet at 2pm.    . donepezil (ARICEPT) 5 MG tablet Take 5 mg by mouth daily.    . fludrocortisone (FLORINEF) 0.1 MG tablet Take 0.1 mg by mouth 2 (two) times daily.    Marland Kitchen glimepiride (AMARYL) 2 MG tablet Take 1 mg by mouth 2 (two) times daily.     Marland Kitchen  linagliptin (TRADJENTA) 5 MG TABS tablet Take 5 mg by mouth daily.    . metFORMIN (GLUCOPHAGE) 1000 MG tablet Take 1,000 mg by mouth 2 (two) times daily.     . rasagiline (AZILECT) 1 MG TABS tablet Take 1 mg by mouth at bedtime.     Marland Kitchen rOPINIRole (REQUIP) 4 MG tablet Take 14 mg by mouth at bedtime. Pt takes with two '5mg'$  tablets.    . simvastatin (ZOCOR) 40 MG tablet Take 40 mg by mouth at bedtime.    . tamsulosin (FLOMAX) 0.4 MG CAPS capsule Take 0.8 mg by mouth daily at 12 noon. At 12 noon.     No current facility-administered medications for this visit.     OBJECTIVE: Vitals:   11/07/15 1359  BP: 98/63  Pulse: 65  Resp: 18  Temp: (!) 95.2 F (35.1 C)     Body mass index is 26.44 kg/m.    ECOG FS:0 - Asymptomatic  General: Well-developed, well-nourished, no acute distress. Sitting in a wheelchair.  Eyes: Pink conjunctiva, anicteric sclera. Lungs: Clear to auscultation bilaterally. Heart: Regular rate and rhythm. No rubs, murmurs, or gallops. Abdomen: Soft, nontender, nondistended. No organomegaly noted, normoactive bowel sounds. Musculoskeletal: No edema, cyanosis, or clubbing. Neuro: Alert, answering all questions appropriately. Cranial nerves grossly intact. Skin: No rashes or petechiae noted. Psych: Normal affect.   LAB RESULTS:  Lab Results  Component Value Date   NA 137 09/10/2015   K 3.7 09/10/2015   CL 102 09/10/2015   CO2 27 09/10/2015   GLUCOSE 140 (H) 09/10/2015   BUN 18 09/10/2015   CREATININE 1.21 09/10/2015   CALCIUM 9.3 09/10/2015   PROT 7.1 07/15/2013   ALBUMIN 3.8 07/15/2013   AST 16 07/15/2013   ALT 10 (L) 07/15/2013   ALKPHOS 61 07/15/2013   BILITOT 0.3 07/15/2013   GFRNONAA 55 (L) 09/10/2015   GFRAA >60 09/10/2015    Lab Results  Component Value Date   WBC 8.9 09/10/2015   HGB 12.3 (L) 09/10/2015   HCT 36.7 (L) 09/10/2015   MCV 98.2 09/10/2015   PLT 206 09/10/2015     STUDIES: Ct Chest W Contrast  Result Date: 10/21/2015 CLINICAL  DATA:  Follow-up necrotic right lower lobe mass versus cavitary pneumonia. Former smoker. Patient denies new chest complaints. EXAM: CT CHEST WITH CONTRAST TECHNIQUE: Multidetector CT imaging of the chest was performed during intravenous contrast administration. CONTRAST:  74m ISOVUE-300 IOPAMIDOL (ISOVUE-300) INJECTION 61% COMPARISON:  Radiographs 07/15/2013 and 09/10/2015.  CT 09/20/2015. FINDINGS: Cardiovascular: Suboptimal contrast bolus. Limited luminal evaluation of the right lower lobe pulmonary artery. There is diffuse atherosclerosis of the aorta, great vessels and coronary arteries. Aortic valvular calcifications are  present. The heart size is normal. There is no pericardial effusion. Mediastinum/Nodes: There is persistent subcarinal and right hilar adenopathy. Subcarinal nodes measure 13 mm short axis on image 63 and 15 mm on image 69. There is confluent right hilar adenopathy, difficult to differentiate from the poorly opacified pulmonary vessels. The thyroid gland, trachea and esophagus demonstrate no significant findings. Lungs/Pleura: There is no pleural effusion. Centrilobular and paraseptal emphysema again noted with subpleural reticulation and scattered parenchymal scarring. There is perifissural nodularity in both lungs. The dominant, partially cavitary mass in the superior segment of the right lower lobe has slightly enlarged, measuring 5.4 x 4.1 cm on image 68. Upper abdomen: There are bilateral adrenal masses worrisome for metastatic disease. These measure 5.2 x 3.5 cm on the left (image 146) and 3.0 x 1.9 cm on the right (image 154). The adrenal glands were not fully imaged previously. There is a calcified gallstone. No definite hepatic abnormality, although hepatic evaluation limited by suboptimal contrast bolus. Musculoskeletal/Chest wall: There is no chest wall mass or suspicious osseous finding. Old left-sided rib fractures are noted. There is posttraumatic deformity of the left clavicle.  IMPRESSION: 1. Progressive enlargement of cavitary right lower lobe mass highly worrisome for squamous cell carcinoma. 2. Associated right hilar and subcarinal adenopathy consistent with metastatic disease. Bilateral adrenal masses are also worrisome for metastatic disease. Hepatic evaluation limited. 3. Underlying chronic lung disease with emphysema and subpleural reticulation. 4. Diffuse atherosclerosis. Aortic Atherosclerosis (ICD10-170.0). Aortic valvular calcifications. 5. Tissue sampling of the right lower lobe mass and/or PET-CT recommended for further evaluation. Electronically Signed   By: Richardean Sale M.D.   On: 10/21/2015 15:59   Nm Pet Image Initial (pi) Skull Base To Thigh  Result Date: 10/29/2015 CLINICAL DATA:  Initial treatment strategy for right lower lobe lung mass seen on recent chest CT studies. EXAM: NUCLEAR MEDICINE PET SKULL BASE TO THIGH TECHNIQUE: 12.3 mCi F-18 FDG was injected intravenously. Full-ring PET imaging was performed from the skull base to thigh after the radiotracer. CT data was obtained and used for attenuation correction and anatomic localization. FASTING BLOOD GLUCOSE:  Value: 106 mg/dl COMPARISON:  10/21/2015 chest CT. FINDINGS: NECK No hypermetabolic lymph nodes in the neck. CHEST Hypermetabolic partially cavitary 5.5 x 4.3 cm posterior right lower lobe lung mass with max SUV 11.7, consistent with primary bronchogenic carcinoma. Mild centrilobular and paraseptal emphysema with diffuse bronchial wall thickening. Patchy subpleural reticulation throughout both lungs is not appreciably changed back to 09/10/2015. No acute consolidative airspace disease or new significant pulmonary nodules. Hypermetabolic right hilar adenopathy with max SUV 9.0. Hypermetabolic right subcarinal adenopathy, for example a 1.7 cm right subcarinal node with max SUV 7.9 (series 3/image 109). No hypermetabolic axillary, contralateral mediastinal, supraclavicular or contralateral hilar  lymphadenopathy. Left main, left anterior descending, left circumflex and right coronary atherosclerosis. Atherosclerotic nonaneurysmal thoracic aorta. Trace bilateral pleural effusions. ABDOMEN/PELVIS Hypermetabolic right adrenal 2.9 cm mass with max SUV 10.8 (series 3/image 165). Hypermetabolic 5.2 cm left adrenal mass with max SUV 8.9 (series 3/image 158). Enlarged hypermetabolic 1.3 cm left para-aortic node with max SUV 5.2 (series 3/image 176). No abnormal hypermetabolic activity within the liver, pancreas, or spleen. No hypermetabolic lymph nodes in the pelvis. Cholelithiasis . Atherosclerotic nonaneurysmal abdominal aorta. Nonobstructing 3 mm upper left renal stone. Small to moderate right inguinal hernia containing a right pelvic small bowel loop, with no small bowel dilatation, small bowel wall thickening or small bowel pneumatosis. No free intraperitoneal air. No intraperitoneal fluid collections. SKELETON There is  focal hypermetabolism in the left C1 lateral mass with the suggestion of asymmetric sclerosis in this location with max SUV 7.1, most consistent with a bone metastasis. Focal hypermetabolism in the medial left iliac bone with max SUV 4.7, without discrete bone lesion on the CT images in this location, suspicious for a bone metastasis. Mildly expansile hypermetabolic mixed lytic and sclerotic left posterior sixth rib metastasis with max SUV 10.5. IMPRESSION: 1. Hypermetabolic 5.5 cm posterior right lower lobe lung mass, consistent with primary bronchogenic carcinoma, probably squamous cell carcinoma given the cavitation. Trace bilateral pleural effusions. 2. Hypermetabolic right hilar and subcarinal nodal metastases. 3. Hypermetabolic bilateral adrenal metastases. 4. Hypermetabolic left para-aortic retroperitoneal nodal metastasis. 5. Hypermetabolic left posterior sixth rib, left lateral C1 mass and medial left iliac bone metastases. 6. Small to moderate right inguinal hernia containing a right  pelvic small bowel loop, with no evidence of bowel obstruction or ischemia. 7. Additional findings include mild emphysema, aortic atherosclerosis, left main and 3 vessel coronary atherosclerosis, cholelithiasis and nonobstructing left renal stone. Electronically Signed   By: Ilona Sorrel M.D.   On: 10/29/2015 15:34    ASSESSMENT: Right lower lobe lung mass  PLAN:    1. Right lower lobe lung mass: Highly suspicious for underlying malignancy. PET scan results reviewed independently and reported as above with concern for stage IV disease with adrenal metastasis, retroperitoneal disease, as well as PET positive bone lesions. Patient still wishes to pursue aggressive treatment. Case was discussed with interventional radiology and the recommendation was to attempt a left adrenal biopsy to obtain a diagnosis. CT of the head on September 10, 2015 did not reveal metastatic disease, but patient may need MRI for further evaluation. Return to clinic approximately one week after his biopsy to discuss the results and treatment planning.   Approximately 30 minutes was spent in discussion of which greater than 50% was consultation.  Patient expressed understanding and was in agreement with this plan. He also understands that He can call clinic at any time with any questions, concerns, or complaints.   No matching staging information was found for the patient.  Lloyd Huger, MD   11/10/2015 7:57 AM

## 2015-11-07 ENCOUNTER — Inpatient Hospital Stay: Payer: Medicare Other | Attending: Oncology

## 2015-11-07 ENCOUNTER — Inpatient Hospital Stay (HOSPITAL_BASED_OUTPATIENT_CLINIC_OR_DEPARTMENT_OTHER): Payer: Medicare Other | Admitting: Oncology

## 2015-11-07 VITALS — BP 98/63 | HR 65 | Temp 95.2°F | Resp 18 | Wt 200.4 lb

## 2015-11-07 DIAGNOSIS — Z87891 Personal history of nicotine dependence: Secondary | ICD-10-CM | POA: Diagnosis not present

## 2015-11-07 DIAGNOSIS — K4090 Unilateral inguinal hernia, without obstruction or gangrene, not specified as recurrent: Secondary | ICD-10-CM | POA: Diagnosis not present

## 2015-11-07 DIAGNOSIS — Z7982 Long term (current) use of aspirin: Secondary | ICD-10-CM | POA: Insufficient documentation

## 2015-11-07 DIAGNOSIS — N400 Enlarged prostate without lower urinary tract symptoms: Secondary | ICD-10-CM | POA: Diagnosis not present

## 2015-11-07 DIAGNOSIS — N529 Male erectile dysfunction, unspecified: Secondary | ICD-10-CM | POA: Diagnosis not present

## 2015-11-07 DIAGNOSIS — M722 Plantar fascial fibromatosis: Secondary | ICD-10-CM

## 2015-11-07 DIAGNOSIS — E278 Other specified disorders of adrenal gland: Secondary | ICD-10-CM | POA: Insufficient documentation

## 2015-11-07 DIAGNOSIS — Z7984 Long term (current) use of oral hypoglycemic drugs: Secondary | ICD-10-CM

## 2015-11-07 DIAGNOSIS — G20 Parkinson's disease: Secondary | ICD-10-CM | POA: Diagnosis not present

## 2015-11-07 DIAGNOSIS — I700 Atherosclerosis of aorta: Secondary | ICD-10-CM | POA: Insufficient documentation

## 2015-11-07 DIAGNOSIS — N200 Calculus of kidney: Secondary | ICD-10-CM | POA: Insufficient documentation

## 2015-11-07 DIAGNOSIS — Z85828 Personal history of other malignant neoplasm of skin: Secondary | ICD-10-CM | POA: Insufficient documentation

## 2015-11-07 DIAGNOSIS — Z79899 Other long term (current) drug therapy: Secondary | ICD-10-CM | POA: Insufficient documentation

## 2015-11-07 DIAGNOSIS — R918 Other nonspecific abnormal finding of lung field: Secondary | ICD-10-CM

## 2015-11-07 DIAGNOSIS — C3431 Malignant neoplasm of lower lobe, right bronchus or lung: Secondary | ICD-10-CM | POA: Diagnosis present

## 2015-11-07 DIAGNOSIS — C7951 Secondary malignant neoplasm of bone: Secondary | ICD-10-CM | POA: Diagnosis not present

## 2015-11-07 DIAGNOSIS — R011 Cardiac murmur, unspecified: Secondary | ICD-10-CM | POA: Diagnosis not present

## 2015-11-07 DIAGNOSIS — I252 Old myocardial infarction: Secondary | ICD-10-CM | POA: Diagnosis not present

## 2015-11-07 DIAGNOSIS — Z86010 Personal history of colonic polyps: Secondary | ICD-10-CM

## 2015-11-07 DIAGNOSIS — Z8669 Personal history of other diseases of the nervous system and sense organs: Secondary | ICD-10-CM | POA: Insufficient documentation

## 2015-11-07 DIAGNOSIS — R590 Localized enlarged lymph nodes: Secondary | ICD-10-CM

## 2015-11-07 DIAGNOSIS — E785 Hyperlipidemia, unspecified: Secondary | ICD-10-CM | POA: Insufficient documentation

## 2015-11-07 DIAGNOSIS — I10 Essential (primary) hypertension: Secondary | ICD-10-CM

## 2015-11-07 DIAGNOSIS — C7972 Secondary malignant neoplasm of left adrenal gland: Secondary | ICD-10-CM | POA: Insufficient documentation

## 2015-11-07 DIAGNOSIS — E119 Type 2 diabetes mellitus without complications: Secondary | ICD-10-CM | POA: Insufficient documentation

## 2015-11-07 DIAGNOSIS — J439 Emphysema, unspecified: Secondary | ICD-10-CM | POA: Diagnosis not present

## 2015-11-07 DIAGNOSIS — I2510 Atherosclerotic heart disease of native coronary artery without angina pectoris: Secondary | ICD-10-CM | POA: Diagnosis not present

## 2015-11-07 DIAGNOSIS — Z8042 Family history of malignant neoplasm of prostate: Secondary | ICD-10-CM

## 2015-11-07 NOTE — Progress Notes (Signed)
Offers no complaints. Family states pt is having off day today and not very alert.

## 2015-11-11 ENCOUNTER — Other Ambulatory Visit: Payer: Self-pay | Admitting: *Deleted

## 2015-11-11 DIAGNOSIS — E278 Other specified disorders of adrenal gland: Secondary | ICD-10-CM

## 2015-11-12 ENCOUNTER — Other Ambulatory Visit: Payer: Self-pay | Admitting: *Deleted

## 2015-11-12 ENCOUNTER — Telehealth: Payer: Self-pay | Admitting: *Deleted

## 2015-11-12 MED ORDER — TRAMADOL HCL 50 MG PO TABS: 50.0000 mg | ORAL_TABLET | Freq: Four times a day (QID) | ORAL | 0 refills | Status: DC | PRN

## 2015-11-12 NOTE — Telephone Encounter (Signed)
  Oncology Nurse Navigator Documentation  Navigator Location: [facility] (11/12/15 1300)                 Barriers/Navigation Needs: Coordination of Care (11/12/15 1300)   Interventions: Coordination of Care (11/12/15 1300)   Coordination of Care:  (symptom mangement) (11/12/15 1300)                  Time Spent with Patient: 90 (11/12/15 1300)   Patients son Marya Amsler came by the office today.  He wanted to know what was going on with the appointment for his dad's biopsy.  Explained that the appointment is being scheduled.  We are waiting on the scheduling department to call us back.  Informed him we should know something today.  Offered to schedule an appointment in [place], if that would be sooner.  He is agreeable if there is more than a couple of days difference.  Offered support.  Informed him I needed to call his step mom back that she had left me a message.  He stated she knows I am here.  Called Judy back.  Reviewed my conversation with Marya Amsler.  She is in agreement.  She states the patient's pain is know not controlled with the 3 IBU she has been giving.  States she tried a Hydrocodone, but it made him hallucinate.  She wanted something better for his pain.  Talked with Lanice Shirts, RN who discussed with Dr. Grayland Ormond.  Prescription for Tramadol faxed to the pharmacy.  They are to call with any needs or questions.

## 2015-11-15 ENCOUNTER — Telehealth: Payer: Self-pay | Admitting: Internal Medicine

## 2015-11-15 ENCOUNTER — Other Ambulatory Visit: Payer: Self-pay | Admitting: Physician Assistant

## 2015-11-15 NOTE — Telephone Encounter (Signed)
Called and spoke with patient's wife and she wanted to cancel the CT Biopsy for 11/25/15 and keep the CT Biopsy scheduled for Monday 11/18/15. Will forward back to the nurse to call and make sure the biopsy for 10/23 is cancelled and the biopsy for 10/16 is still on the books. Rhonda J Cobb

## 2015-11-15 NOTE — Telephone Encounter (Signed)
Pt wife calling stating they would like to cancel the CT for Monday for they are going to do another kind of scan  Please call back

## 2015-11-15 NOTE — Telephone Encounter (Signed)
Will route to [place] to f/u

## 2015-11-15 NOTE — Telephone Encounter (Signed)
Apparently Dr. Grayland Ormond has arranged biopsy on 11/18/15. Dr. Mortimer Fries has procedure scheduled for 11/25/15.  Will forward this message to Dr. Mortimer Fries to advise. Rhonda J Cobb

## 2015-11-18 ENCOUNTER — Ambulatory Visit
Admission: RE | Admit: 2015-11-18 | Discharge: 2015-11-18 | Disposition: A | Discharge status: Home / Self Care | Payer: Medicare Other | Source: Ambulatory Visit | Attending: Oncology | Admitting: Oncology

## 2015-11-18 ENCOUNTER — Telehealth: Payer: Self-pay | Admitting: Internal Medicine

## 2015-11-18 ENCOUNTER — Other Ambulatory Visit (HOSPITAL_COMMUNITY): Payer: Self-pay | Admitting: Interventional Radiology

## 2015-11-18 ENCOUNTER — Inpatient Hospital Stay: Admission: RE | Admit: 2015-11-18 | Payer: Medicare Other | Source: Ambulatory Visit

## 2015-11-18 DIAGNOSIS — Z87891 Personal history of nicotine dependence: Secondary | ICD-10-CM | POA: Insufficient documentation

## 2015-11-18 DIAGNOSIS — C7972 Secondary malignant neoplasm of left adrenal gland: Secondary | ICD-10-CM | POA: Diagnosis not present

## 2015-11-18 DIAGNOSIS — E278 Other specified disorders of adrenal gland: Secondary | ICD-10-CM | POA: Insufficient documentation

## 2015-11-18 DIAGNOSIS — G20 Parkinson's disease: Secondary | ICD-10-CM | POA: Insufficient documentation

## 2015-11-18 DIAGNOSIS — N529 Male erectile dysfunction, unspecified: Secondary | ICD-10-CM | POA: Diagnosis not present

## 2015-11-18 DIAGNOSIS — Z955 Presence of coronary angioplasty implant and graft: Secondary | ICD-10-CM | POA: Insufficient documentation

## 2015-11-18 DIAGNOSIS — I2510 Atherosclerotic heart disease of native coronary artery without angina pectoris: Secondary | ICD-10-CM | POA: Insufficient documentation

## 2015-11-18 DIAGNOSIS — H9190 Unspecified hearing loss, unspecified ear: Secondary | ICD-10-CM | POA: Insufficient documentation

## 2015-11-18 DIAGNOSIS — Z7984 Long term (current) use of oral hypoglycemic drugs: Secondary | ICD-10-CM | POA: Insufficient documentation

## 2015-11-18 DIAGNOSIS — I252 Old myocardial infarction: Secondary | ICD-10-CM | POA: Insufficient documentation

## 2015-11-18 DIAGNOSIS — E785 Hyperlipidemia, unspecified: Secondary | ICD-10-CM | POA: Diagnosis not present

## 2015-11-18 DIAGNOSIS — Z86010 Personal history of colonic polyps: Secondary | ICD-10-CM | POA: Diagnosis not present

## 2015-11-18 DIAGNOSIS — I10 Essential (primary) hypertension: Secondary | ICD-10-CM | POA: Diagnosis not present

## 2015-11-18 DIAGNOSIS — Z85828 Personal history of other malignant neoplasm of skin: Secondary | ICD-10-CM | POA: Diagnosis not present

## 2015-11-18 DIAGNOSIS — N400 Enlarged prostate without lower urinary tract symptoms: Secondary | ICD-10-CM | POA: Insufficient documentation

## 2015-11-18 DIAGNOSIS — Z833 Family history of diabetes mellitus: Secondary | ICD-10-CM | POA: Insufficient documentation

## 2015-11-18 DIAGNOSIS — Z8042 Family history of malignant neoplasm of prostate: Secondary | ICD-10-CM | POA: Diagnosis not present

## 2015-11-18 DIAGNOSIS — Z8249 Family history of ischemic heart disease and other diseases of the circulatory system: Secondary | ICD-10-CM | POA: Insufficient documentation

## 2015-11-18 DIAGNOSIS — E119 Type 2 diabetes mellitus without complications: Secondary | ICD-10-CM | POA: Diagnosis not present

## 2015-11-18 DIAGNOSIS — Z9841 Cataract extraction status, right eye: Secondary | ICD-10-CM | POA: Diagnosis not present

## 2015-11-18 LAB — CBC
HEMATOCRIT: 32.4 % — AB (ref 40.0–52.0)
Hemoglobin: 11.2 g/dL — ABNORMAL LOW (ref 13.0–18.0)
MCH: 32.9 pg (ref 26.0–34.0)
MCHC: 34.5 g/dL (ref 32.0–36.0)
MCV: 95.5 fL (ref 80.0–100.0)
Platelets: 229 10*3/uL (ref 150–440)
RBC: 3.39 MIL/uL — AB (ref 4.40–5.90)
RDW: 13.4 % (ref 11.5–14.5)
WBC: 8.3 10*3/uL (ref 3.8–10.6)

## 2015-11-18 LAB — PROTIME-INR
INR: 1.13
Prothrombin Time: 14.6 seconds (ref 11.4–15.2)

## 2015-11-18 LAB — APTT: aPTT: 40 seconds — ABNORMAL HIGH (ref 24–36)

## 2015-11-18 MED ORDER — MIDAZOLAM HCL 5 MG/5ML IJ SOLN
INTRAMUSCULAR | Status: AC | PRN
  Administered 2015-11-18: 0.5 mg via INTRAVENOUS

## 2015-11-18 MED ORDER — FENTANYL CITRATE (PF) 100 MCG/2ML IJ SOLN
INTRAMUSCULAR | Status: AC | PRN
  Administered 2015-11-18: 12.5 ug via INTRAVENOUS
  Administered 2015-11-18: 25 ug via INTRAVENOUS
  Administered 2015-11-18: 12.5 ug via INTRAVENOUS

## 2015-11-18 MED ORDER — SODIUM CHLORIDE 0.9 % IV SOLN
INTRAVENOUS | Status: DC
  Administered 2015-11-18: 10:00:00 via INTRAVENOUS

## 2015-11-18 NOTE — Telephone Encounter (Signed)
Pre Admit testing just called to tell us patient was a "no show" for them today.

## 2015-11-18 NOTE — Procedures (Signed)
Technically successful CT guided biopsy of indeterminate left adrenal gland mass  EBL: None.  No immediate post procedural complications.   Ronny Bacon, MD Pager #: [phone]

## 2015-11-18 NOTE — Telephone Encounter (Signed)
FYI. Pt no showed for Pre-assessment today. His EBUS is 11/25/15. I will call pt and see if he wants to reschedule.

## 2015-11-18 NOTE — Consult Note (Signed)
Chief Complaint: Concern for metastatic lung cancer.  Referring Physician(s): Finnegan,Timothy J  Patient Status: Out-pt  History of Present Illness: Craig Dennis. is a 82 y.o. male with past medical history significant for coronary artery disease (post myocardial infarction), diabetes, hyperlipidemia, , heart murmur, hypertension and Parkinson's disease, who was in his baseline state of health until 09/10/2015 after he presented to the emergency room after suffering a fall.  Chest radiograph obtained in the ED demonstrated a right lower lung mass, subsequently confirmed chest CT. Subsequent PET/CT 10/29/2015 demonstrated findings concerning for metastatic lung cancer including bilateral hypermetabolic adrenal gland masses. Patient presents today for CT-guided biopsy of dominant adrenal gland mass.  Patient is currently out complaint regarding his findings concerning for metastatic lung cancer. Specifically, no chest pain or shortness of breath. No hemoptysis. No unintentional weight loss or weight gain.  The patient states he has fully recovered from his fall.    Past Medical History:  Diagnosis Date  . BPH (benign prostatic hyperplasia)   . CAD (coronary artery disease)   . Cancer ([place])    skin  . Colon polyps   . Diabetes ([place])   . Dyslipidemia   . Erectile dysfunction   . Gait disorder   . Heart murmur   . History of chest pain   . History of headache    chronic, recurrent, postconcussive, after fall from a collapsed deck, had been treated wtih prn fiorinal  . HOH (hard of hearing)   . Hypertension   . Myocardial infarction   . Parkinson's disease    Primarily right sided features  . Parkinson's disease ([place])   . Plantar fasciitis     Past Surgical History:  Procedure Laterality Date  . cardicac stent    . CATARACT EXTRACTION W/PHACO Right 06/28/2014   Procedure: CATARACT EXTRACTION PHACO AND INTRAOCULAR LENS PLACEMENT (IOC);  Surgeon: Lyla Glassing, MD;   Location: [facility] ORS;  Service: Ophthalmology;  Laterality: Right;  [place]    1:07.4               AP     12.6             CDE   8.50  . CORONARY ANGIOPLASTY     stent  . HEMORRHOID SURGERY    . TONSILLECTOMY AND ADENOIDECTOMY      Allergies: Review of patient's allergies indicates no known allergies.  Medications: Prior to Admission medications   Medication Sig Start Date End Date Taking? Authorizing Provider  ALPRAZolam Duanne Moron) 0.25 MG tablet Take 0.25 mg by mouth at bedtime as needed for anxiety.   Yes Historical Provider, MD  amantadine (SYMMETREL) 100 MG capsule Take 100 mg by mouth 2 (two) times daily.   Yes Historical Provider, MD  aspirin EC 81 MG tablet Take 81 mg by mouth daily.   Yes Historical Provider, MD  butalbital-aspirin-caffeine [facility]) 50-325-40 MG capsule Take 1 capsule by mouth 4 (four) times daily as needed for migraine.   Yes Historical Provider, MD  carbidopa-levodopa (SINEMET CR) 50-200 MG per tablet Take 7 tablets by mouth daily. Pt takes 2 tablet at 7am, 2 tablets at 12 pm, and one tablet at 2pm.   Yes Historical Provider, MD  donepezil (ARICEPT) 5 MG tablet Take 5 mg by mouth daily.   Yes Historical Provider, MD  fludrocortisone (FLORINEF) 0.1 MG tablet Take 0.1 mg by mouth 2 (two) times daily.   Yes Historical Provider, MD  glimepiride (AMARYL) 2 MG tablet Take  1 mg by mouth 2 (two) times daily.    Yes Historical Provider, MD  linagliptin (TRADJENTA) 5 MG TABS tablet Take 5 mg by mouth daily.   Yes Historical Provider, MD  metFORMIN (GLUCOPHAGE) 1000 MG tablet Take 1,000 mg by mouth 2 (two) times daily.    Yes Historical Provider, MD  rasagiline (AZILECT) 1 MG TABS tablet Take 1 mg by mouth at bedtime.    Yes Historical Provider, MD  rOPINIRole (REQUIP) 4 MG tablet Take 14 mg by mouth at bedtime. Pt takes with two '5mg'$  tablets.   Yes Historical Provider, MD  simvastatin (ZOCOR) 40 MG tablet Take 40 mg by mouth at bedtime.   Yes Historical Provider, MD  tamsulosin  (FLOMAX) 0.4 MG CAPS capsule Take 0.8 mg by mouth daily at 12 noon. At 12 noon.   Yes Historical Provider, MD  traMADol (ULTRAM) 50 MG tablet Take 1 tablet (50 mg total) by mouth every 6 (six) hours as needed. 11/12/15  Yes Lloyd Huger, MD     Family History  Problem Relation Age of Onset  . Heart disease Mother   . Heart attack Mother   . Diabetes Mother   . Tremor Mother     Benign essential tremor, but not parkinson's disease  . Prostate cancer Father   . Heart disease Father     Social History   Social History  . Marital status: Married    Spouse name: N/A  . Number of children: 2  . Years of education: 16   Occupational History  . Retired    Social History Main Topics  . Smoking status: Former Smoker    Quit date: 02/03/1955  . Smokeless tobacco: Never Used  . Alcohol use No  . Drug use: No  . Sexual activity: Not Asked   Other Topics Concern  . 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  All other systems are negative.  Review of Systems  Constitutional: Negative for activity change, fatigue, fever and unexpected weight change.  Respiratory: Negative.   Cardiovascular: Negative.   Gastrointestinal: Negative.   Musculoskeletal: Positive for gait problem.       Baseline gait problem with history of multiple falls secondary to his history of Parkinson's disease.  Psychiatric/Behavioral: Negative.     Vital Signs: BP 121/75   Pulse 77   Temp 98.3 F (36.8 C) (Oral)   Resp 19   SpO2 94%   Physical Exam  Constitutional: He appears well-developed and well-nourished.  HENT:  Head: Normocephalic and atraumatic.  Cardiovascular:  Murmur heard. Holosystolic murmur.  Pulmonary/Chest: Effort normal  and breath sounds normal.  Abdominal: Soft. Bowel sounds are normal.  Skin: Skin is warm and dry.  Psychiatric:  Patient appeared mildly confused with run-on sentences and explanations though the family states this is baseline. While the family states he signs his own consents, I had the patient's wife witness the consent.  Nursing note and vitals reviewed.  Imaging: Ct Chest W Contrast  Result Date: 10/21/2015 CLINICAL DATA:  Follow-up necrotic right lower lobe mass versus cavitary pneumonia. Former smoker. Patient denies new chest complaints. EXAM: CT CHEST WITH CONTRAST TECHNIQUE: Multidetector CT imaging of the chest was  performed during intravenous contrast administration. CONTRAST:  74m ISOVUE-300 IOPAMIDOL (ISOVUE-300) INJECTION 61% COMPARISON:  Radiographs 07/15/2013 and 09/10/2015.  CT 09/20/2015. FINDINGS: Cardiovascular: Suboptimal contrast bolus. Limited luminal evaluation of the right lower lobe pulmonary artery. There is diffuse atherosclerosis of the aorta, great vessels and coronary arteries. Aortic valvular calcifications are present. The heart size is normal. There is no pericardial effusion. Mediastinum/Nodes: There is persistent subcarinal and right hilar adenopathy. Subcarinal nodes measure 13 mm short axis on image 63 and 15 mm on image 69. There is confluent right hilar adenopathy, difficult to differentiate from the poorly opacified pulmonary vessels. The thyroid gland, trachea and esophagus demonstrate no significant findings. Lungs/Pleura: There is no pleural effusion. Centrilobular and paraseptal emphysema again noted with subpleural reticulation and scattered parenchymal scarring. There is perifissural nodularity in both lungs. The dominant, partially cavitary mass in the superior segment of the right lower lobe has slightly enlarged, measuring 5.4 x 4.1 cm on image 68. Upper abdomen: There are bilateral adrenal masses worrisome for metastatic disease. These measure 5.2 x 3.5  cm on the left (image 146) and 3.0 x 1.9 cm on the right (image 154). The adrenal glands were not fully imaged previously. There is a calcified gallstone. No definite hepatic abnormality, although hepatic evaluation limited by suboptimal contrast bolus. Musculoskeletal/Chest wall: There is no chest wall mass or suspicious osseous finding. Old left-sided rib fractures are noted. There is posttraumatic deformity of the left clavicle. IMPRESSION: 1. Progressive enlargement of cavitary right lower lobe mass highly worrisome for squamous cell carcinoma. 2. Associated right hilar and subcarinal adenopathy consistent with metastatic disease. Bilateral adrenal masses are also worrisome for metastatic disease. Hepatic evaluation limited. 3. Underlying chronic lung disease with emphysema and subpleural reticulation. 4. Diffuse atherosclerosis. Aortic Atherosclerosis (ICD10-170.0). Aortic valvular calcifications. 5. Tissue sampling of the right lower lobe mass and/or PET-CT recommended for further evaluation. Electronically Signed   By: WRichardean SaleM.D.   On: 10/21/2015 15:59   Nm Pet Image Initial (pi) Skull Base To Thigh  Result Date: 10/29/2015 CLINICAL DATA:  Initial treatment strategy for right lower lobe lung mass seen on recent chest CT studies. EXAM: NUCLEAR MEDICINE PET SKULL BASE TO THIGH TECHNIQUE: 12.3 mCi F-18 FDG was injected intravenously. Full-ring PET imaging was performed from the skull base to thigh after the radiotracer. CT data was obtained and used for attenuation correction and anatomic localization. FASTING BLOOD GLUCOSE:  Value: 106 mg/dl COMPARISON:  10/21/2015 chest CT. FINDINGS: NECK No hypermetabolic lymph nodes in the neck. CHEST Hypermetabolic partially cavitary 5.5 x 4.3 cm posterior right lower lobe lung mass with max SUV 11.7, consistent with primary bronchogenic carcinoma. Mild centrilobular and paraseptal emphysema with diffuse bronchial wall thickening. Patchy subpleural  reticulation throughout both lungs is not appreciably changed back to 09/10/2015. No acute consolidative airspace disease or new significant pulmonary nodules. Hypermetabolic right hilar adenopathy with max SUV 9.0. Hypermetabolic right subcarinal adenopathy, for example a 1.7 cm right subcarinal node with max SUV 7.9 (series 3/image 109). No hypermetabolic axillary, contralateral mediastinal, supraclavicular or contralateral hilar lymphadenopathy. Left main, left anterior descending, left circumflex and right coronary atherosclerosis. Atherosclerotic nonaneurysmal thoracic aorta. Trace bilateral pleural effusions. ABDOMEN/PELVIS Hypermetabolic right adrenal 2.9 cm mass with max SUV 10.8 (series 3/image 165). Hypermetabolic 5.2 cm left adrenal mass with max SUV 8.9 (series 3/image 158). Enlarged hypermetabolic 1.3 cm left para-aortic node with max SUV 5.2 (series 3/image 176). No abnormal hypermetabolic activity within the liver, pancreas, or spleen. No hypermetabolic lymph nodes  in the pelvis. Cholelithiasis . Atherosclerotic nonaneurysmal abdominal aorta. Nonobstructing 3 mm upper left renal stone. Small to moderate right inguinal hernia containing a right pelvic small bowel loop, with no small bowel dilatation, small bowel wall thickening or small bowel pneumatosis. No free intraperitoneal air. No intraperitoneal fluid collections. SKELETON There is focal hypermetabolism in the left C1 lateral mass with the suggestion of asymmetric sclerosis in this location with max SUV 7.1, most consistent with a bone metastasis. Focal hypermetabolism in the medial left iliac bone with max SUV 4.7, without discrete bone lesion on the CT images in this location, suspicious for a bone metastasis. Mildly expansile hypermetabolic mixed lytic and sclerotic left posterior sixth rib metastasis with max SUV 10.5. IMPRESSION: 1. Hypermetabolic 5.5 cm posterior right lower lobe lung mass, consistent with primary bronchogenic carcinoma,  probably squamous cell carcinoma given the cavitation. Trace bilateral pleural effusions. 2. Hypermetabolic right hilar and subcarinal nodal metastases. 3. Hypermetabolic bilateral adrenal metastases. 4. Hypermetabolic left para-aortic retroperitoneal nodal metastasis. 5. Hypermetabolic left posterior sixth rib, left lateral C1 mass and medial left iliac bone metastases. 6. Small to moderate right inguinal hernia containing a right pelvic small bowel loop, with no evidence of bowel obstruction or ischemia. 7. Additional findings include mild emphysema, aortic atherosclerosis, left main and 3 vessel coronary atherosclerosis, cholelithiasis and nonobstructing left renal stone. Electronically Signed   By: Ilona Sorrel M.D.   On: 10/29/2015 15:34    Labs:  CBC:  Recent Labs  02/27/15 1233 02/28/15 0600 09/10/15 1338 11/18/15 0814  WBC 9.9 9.8 8.9 8.3  HGB 13.9 13.0 12.3* 11.2*  HCT 42.2 38.9* 36.7* 32.4*  PLT 188 190 206 229    COAGS:  Recent Labs  11/18/15 0814  INR 1.13  APTT 40*    BMP:  Recent Labs  02/27/15 1233 02/28/15 0600 09/10/15 1338  NA 134* 139 137  K 5.2* 3.9 3.7  CL 100* 103 102  CO2 '25 29 27  '$ GLUCOSE 105* 105* 140*  BUN '13 13 18  '$ CALCIUM 10.2 9.0 9.3  CREATININE 0.79 0.98 1.21  GFRNONAA >60 >60 55*  GFRAA >60 >60 >60    LIVER FUNCTION TESTS: No results for input(s): BILITOT, AST, ALT, ALKPHOS, PROT, ALBUMIN in the last 8760 hours.  TUMOR MARKERS: No results for input(s): AFPTM, CEA, CA199, CHROMGRNA in the last 8760 hours.  Assessment and Plan:  Craig Dennis. is a 82 y.o. male with past medical history significant for coronary artery disease (post myocardial infarction), diabetes, hyperlipidemia, , heart murmur, hypertension and Parkinson's disease, who was in his baseline state of health until 09/10/2015 after he presented to the emergency room after suffering a fall.  Chest radiograph obtained in the ED demonstrated a right lower lung mass,  subsequently confirmed chest CT. Subsequent PET/CT 10/29/2015 demonstrated findings concerning for metastatic lung cancer including bilateral hypermetabolic adrenal gland masses. Patient presents today for CT-guided biopsy of dominant adrenal gland mass.  Risks and Benefits of CT-guided renal mass biopsy were discussed with the patient including, but not limited to bleeding, infection, damage to adjacent structures or low yield requiring additional tests.  All of the patient's questions were answered, patient is agreeable to proceed.  Patient appeared mildly confused with run-on sentences and explanations though the family states this is baseline. While the family states he signs his own consents, I had the patient's wife witness the consent.  Consent signed and in chart.  Thank you for this interesting consult.  I greatly enjoyed  meeting Craig Dennis. and look forward to participating in their care.  A copy of this report was sent to the requesting provider on this date.  Electronically Signed: Sandi Mariscal 11/18/2015, 9:19 AM   I spent a total of 15 Minutes in face to face in clinical consultation, greater than 50% of which was counseling/coordinating care for CT-guided adrenal mass biopsy

## 2015-11-19 NOTE — Telephone Encounter (Signed)
Spoke with pt's wife and she states pt had a different procedure with Dr. Pascal Lux yesterday that was arranged by Dr. Tod Persia. I will call and cancel the EBUS that was scheduled for 11/25/15.

## 2015-11-25 ENCOUNTER — Ambulatory Visit: Admission: RE | Admit: 2015-11-25 | Payer: Medicare Other | Source: Ambulatory Visit | Admitting: Internal Medicine

## 2015-11-25 ENCOUNTER — Encounter: Admission: RE | Payer: Self-pay | Source: Ambulatory Visit

## 2015-11-25 SURGERY — ENDOBRONCHIAL ULTRASOUND (EBUS)
Anesthesia: Choice

## 2015-11-25 NOTE — Progress Notes (Signed)
[place]  [phone] [phone]  ID: Craig Dennis. 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   INTERVAL HISTORY: Patient returns to clinic today for discussion of his final pathology results and treatment planning. He continues to have left flank pain in the area of his known metastasis in his sixth rib. He otherwise feels well and is asymptomatic. Patient's family reports increased urinary frequency and concern for UTI. He has no neurologic complaints. He denies any recent fevers. He denies any other pain.  He has a good appetite and denies weight loss. He denies any chest pain, shortness of breath, cough, or hemoptysis. He has no nausea, vomiting, constipation, or diarrhea. He has no urinary complaints. Patient offers no further specific complaints today.  REVIEW OF SYSTEMS:   Review of Systems  Constitutional: Negative.  Negative for fever, malaise/fatigue and weight loss.  Respiratory: Negative.  Negative for cough, hemoptysis and shortness of breath.   Cardiovascular: Negative.  Negative for chest pain and leg swelling.  Gastrointestinal: Negative.  Negative for abdominal pain.  Genitourinary: Positive for flank pain and frequency.  Neurological: Negative.  Negative for sensory change, focal weakness and weakness.  Psychiatric/Behavioral: Negative.  The patient is not nervous/anxious.     As per HPI. Otherwise, a complete review of systems is negative.  PAST MEDICAL HISTORY: Past Medical History:  Diagnosis Date  . BPH (benign prostatic hyperplasia)   . CAD (coronary artery disease)   . Cancer ([place])    skin  . Colon polyps   . Diabetes ([place])   . Dyslipidemia   . Erectile dysfunction    . Gait disorder   . Heart murmur   . History of chest pain   . History of headache    chronic, recurrent, postconcussive, after fall from a collapsed deck, had been treated wtih prn fiorinal  . HOH (hard of hearing)   . Hypertension   . Myocardial infarction   . Parkinson's disease    Primarily right sided features  . Parkinson's disease ([place])   . Plantar fasciitis     PAST SURGICAL HISTORY: Past Surgical History:  Procedure Laterality Date  . cardicac stent    . CATARACT EXTRACTION W/PHACO Right 06/28/2014   Procedure: CATARACT EXTRACTION PHACO AND INTRAOCULAR LENS PLACEMENT (IOC);  Surgeon: Lyla Glassing, MD;  Location: [facility] ORS;  Service: Ophthalmology;  Laterality: Right;  [place]    1:07.4               AP     12.6             CDE   8.50  . CORONARY ANGIOPLASTY     stent  . HEMORRHOID SURGERY    . TONSILLECTOMY AND ADENOIDECTOMY      FAMILY HISTORY: Family History  Problem Relation Age of Onset  . Heart disease Mother   . Heart attack Mother   . Diabetes Mother   . Tremor Mother     Benign essential tremor, but not parkinson's disease  . Prostate cancer Father   . Heart disease Father     ADVANCED DIRECTIVES (Y/N):  N  HEALTH MAINTENANCE: Social History  Substance Use Topics  . Smoking status: Former Smoker    Quit date: 02/03/1955  .  Smokeless tobacco: Never Used  . Alcohol use No     Colonoscopy:  PAP:  Bone density:  Lipid panel:  No Known Allergies  Current Outpatient Prescriptions  Medication Sig Dispense Refill  . ALPRAZolam (XANAX) 0.25 MG tablet Take 0.25 mg by mouth at bedtime as needed for anxiety.    Marland Kitchen amantadine (SYMMETREL) 100 MG capsule Take 100 mg by mouth 2 (two) times daily.    Marland Kitchen aspirin EC 81 MG tablet Take 81 mg by mouth daily.    . butalbital-aspirin-caffeine (FIORINAL) 50-325-40 MG capsule Take 1 capsule by mouth 4 (four) times daily as needed for migraine.    . carbidopa-levodopa (SINEMET CR) 50-200 MG per tablet Take 7 tablets by  mouth daily. Pt takes 2 tablet at 7am, 2 tablets at 12 pm, and one tablet at 2pm.    . donepezil (ARICEPT) 5 MG tablet Take 5 mg by mouth daily.    . fludrocortisone (FLORINEF) 0.1 MG tablet Take 0.1 mg by mouth 2 (two) times daily.    Marland Kitchen glimepiride (AMARYL) 2 MG tablet Take 1 mg by mouth 2 (two) times daily.     Marland Kitchen linagliptin (TRADJENTA) 5 MG TABS tablet Take 5 mg by mouth daily.    . metFORMIN (GLUCOPHAGE) 1000 MG tablet Take 1,000 mg by mouth 2 (two) times daily.     . rasagiline (AZILECT) 1 MG TABS tablet Take 1 mg by mouth at bedtime.     Marland Kitchen rOPINIRole (REQUIP) 4 MG tablet Take 14 mg by mouth at bedtime. Pt takes with two 12m tablets.    . simvastatin (ZOCOR) 40 MG tablet Take 40 mg by mouth at bedtime.    . tamsulosin (FLOMAX) 0.4 MG CAPS capsule Take 0.8 mg by mouth daily at 12 noon. At 12 noon.    . traMADol (ULTRAM) 50 MG tablet Take 1 tablet (50 mg total) by mouth every 6 (six) hours as needed. 30 tablet 0   No current facility-administered medications for this visit.     OBJECTIVE: Vitals:   11/26/15 1604  BP: 116/79  Pulse: 94  Resp: 19  Temp: 99.7 F (37.6 C)     There is no height or weight on file to calculate BMI.    ECOG FS:1 - Symptomatic but completely ambulatory  General: Well-developed, well-nourished, no acute distress. Sitting in a wheelchair.  Eyes: Pink conjunctiva, anicteric sclera. Lungs: Clear to auscultation bilaterally. Heart: Regular rate and rhythm. No rubs, murmurs, or gallops. Abdomen: Soft, nontender, nondistended. No organomegaly noted, normoactive bowel sounds. Musculoskeletal: No edema, cyanosis, or clubbing. Neuro: Alert, answering all questions appropriately. Cranial nerves grossly intact. Skin: No rashes or petechiae noted. Psych: Normal affect.   LAB RESULTS:  Lab Results  Component Value Date   NA 137 09/10/2015   K 3.7 09/10/2015   CL 102 09/10/2015   CO2 27 09/10/2015   GLUCOSE 140 (H) 09/10/2015   BUN 18 09/10/2015    CREATININE 1.21 09/10/2015   CALCIUM 9.3 09/10/2015   PROT 7.1 07/15/2013   ALBUMIN 3.8 07/15/2013   AST 16 07/15/2013   ALT 10 (L) 07/15/2013   ALKPHOS 61 07/15/2013   BILITOT 0.3 07/15/2013   GFRNONAA 55 (L) 09/10/2015   GFRAA >60 09/10/2015    Lab Results  Component Value Date   WBC 8.3 11/18/2015   HGB 11.2 (L) 11/18/2015   HCT 32.4 (L) 11/18/2015   MCV 95.5 11/18/2015   PLT 229 11/18/2015     STUDIES: Ct Biopsy  Result Date:  11/18/2015 INDICATION: Concern for metastatic lung cancer. Please perform CT-guided left adrenal gland mass biopsy for tissue diagnostic purposes EXAM: CT BIOPSY INDETERMINATE LEFT ADRENAL GLAND MASS COMPARISON:  PET-CT - 10/29/2015 ; chest CT- 10/21/2015 MEDICATIONS: None. ANESTHESIA/SEDATION: Fentanyl 50 mcg IV; Versed 0.5 mg IV Sedation time: 20 minutes; The patient was continuously monitored during the procedure by the interventional radiology nurse under my direct supervision. CONTRAST:  None. COMPLICATIONS: None immediate. PROCEDURE: Informed consent was obtained from the patient following an explanation of the procedure, risks, benefits and alternatives. A time out was performed prior to the initiation of the procedure. The patient was positioned prone on the CT table and a limited CT was performed for procedural planning demonstrating unchanged size and appearance of dominant approximately 5.2 x 4.7 cm left adrenal gland mass (image 31, series 2). The procedure was planned. The operative site was prepped and draped in the usual sterile fashion. Appropriate trajectory was confirmed with a 22 gauge spinal needle after the adjacent tissues were anesthetized with 1% Lidocaine with epinephrine. Under intermittent CT guidance, a 17 gauge coaxial needle was advanced into the peripheral aspect of the mass. Appropriate positioning was confirmed and 5 core needle biopsy samples were obtained with an 18 gauge core needle biopsy device. The co-axial needle was removed  and hemostasis was achieved with manual compression. A limited postprocedural CT was negative for hemorrhage or additional complication. A dressing was placed. The patient tolerated the procedure well without immediate postprocedural complication. IMPRESSION: Technically successful CT guided core needle biopsy of indeterminate left adrenal gland mass. Electronically Signed   By: Sandi Mariscal M.D.   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  PLAN:    1. 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 The differential diagnosis based on the pattern of immunoreactivity includes metastatic gastrointestinal adenocarcinoma and pulmonary adenocarcinoma with intestinal / enteric differentiation. MSI testing has been ordered and is pending at time of dictation.  PET scan results reviewed independently and reported as above with adrenal metastasis, retroperitoneal disease, as well as PET positive bone lesions. Patient still wishes to pursue aggressive treatment. Given patient's advanced age and decreased performance status, we will not use chemotherapy and pursue immunotherapy with Keytruda. Return to clinic in 1-2 weeks to initiate cycle 1. Patient will receive treatment every 3 weeks and then plan to reimage after approximately 3 months.  2. Urinary frequency: UA and culture are negative. Monitor.  Approximately 30 minutes was spent in discussion of which greater than 50% was consultation.  Patient expressed understanding and was in agreement with this plan. He also understands that He can call clinic at any time with any questions, concerns, or complaints.   Primary cancer of right lower lobe of lung ([place])   Staging form: Lung, AJCC 7th Edition   - Clinical stage from 12/01/2015: Stage  IV (T2b, N2, M1b) - Signed by Lloyd Huger, MD on 12/01/2015  Lloyd Huger, MD   12/01/2015 10:00 AM

## 2015-11-26 ENCOUNTER — Inpatient Hospital Stay (HOSPITAL_BASED_OUTPATIENT_CLINIC_OR_DEPARTMENT_OTHER): Payer: Medicare Other | Admitting: Oncology

## 2015-11-26 VITALS — BP 116/79 | HR 94 | Temp 99.7°F | Resp 19

## 2015-11-26 DIAGNOSIS — Z8669 Personal history of other diseases of the nervous system and sense organs: Secondary | ICD-10-CM

## 2015-11-26 DIAGNOSIS — C7972 Secondary malignant neoplasm of left adrenal gland: Secondary | ICD-10-CM

## 2015-11-26 DIAGNOSIS — N529 Male erectile dysfunction, unspecified: Secondary | ICD-10-CM

## 2015-11-26 DIAGNOSIS — I2510 Atherosclerotic heart disease of native coronary artery without angina pectoris: Secondary | ICD-10-CM

## 2015-11-26 DIAGNOSIS — C7951 Secondary malignant neoplasm of bone: Secondary | ICD-10-CM | POA: Diagnosis not present

## 2015-11-26 DIAGNOSIS — Z86010 Personal history of colonic polyps: Secondary | ICD-10-CM

## 2015-11-26 DIAGNOSIS — I252 Old myocardial infarction: Secondary | ICD-10-CM

## 2015-11-26 DIAGNOSIS — Z85828 Personal history of other malignant neoplasm of skin: Secondary | ICD-10-CM

## 2015-11-26 DIAGNOSIS — N200 Calculus of kidney: Secondary | ICD-10-CM

## 2015-11-26 DIAGNOSIS — R590 Localized enlarged lymph nodes: Secondary | ICD-10-CM

## 2015-11-26 DIAGNOSIS — C3431 Malignant neoplasm of lower lobe, right bronchus or lung: Secondary | ICD-10-CM

## 2015-11-26 DIAGNOSIS — J439 Emphysema, unspecified: Secondary | ICD-10-CM

## 2015-11-26 DIAGNOSIS — Z8042 Family history of malignant neoplasm of prostate: Secondary | ICD-10-CM

## 2015-11-26 DIAGNOSIS — Z79899 Other long term (current) drug therapy: Secondary | ICD-10-CM

## 2015-11-26 DIAGNOSIS — Z7984 Long term (current) use of oral hypoglycemic drugs: Secondary | ICD-10-CM

## 2015-11-26 DIAGNOSIS — Z87891 Personal history of nicotine dependence: Secondary | ICD-10-CM

## 2015-11-26 DIAGNOSIS — I700 Atherosclerosis of aorta: Secondary | ICD-10-CM

## 2015-11-26 DIAGNOSIS — E278 Other specified disorders of adrenal gland: Secondary | ICD-10-CM

## 2015-11-26 DIAGNOSIS — R011 Cardiac murmur, unspecified: Secondary | ICD-10-CM

## 2015-11-26 DIAGNOSIS — G20 Parkinson's disease: Secondary | ICD-10-CM

## 2015-11-26 DIAGNOSIS — N400 Enlarged prostate without lower urinary tract symptoms: Secondary | ICD-10-CM | POA: Diagnosis not present

## 2015-11-26 DIAGNOSIS — K4090 Unilateral inguinal hernia, without obstruction or gangrene, not specified as recurrent: Secondary | ICD-10-CM

## 2015-11-26 DIAGNOSIS — Z7982 Long term (current) use of aspirin: Secondary | ICD-10-CM

## 2015-11-26 DIAGNOSIS — I10 Essential (primary) hypertension: Secondary | ICD-10-CM

## 2015-11-26 DIAGNOSIS — E785 Hyperlipidemia, unspecified: Secondary | ICD-10-CM

## 2015-11-26 DIAGNOSIS — M722 Plantar fascial fibromatosis: Secondary | ICD-10-CM

## 2015-11-26 DIAGNOSIS — E119 Type 2 diabetes mellitus without complications: Secondary | ICD-10-CM

## 2015-11-26 NOTE — Progress Notes (Signed)
States is feeling well. Per family, they believe pt hast UTI and have been encouraging fluid intake.

## 2015-11-27 ENCOUNTER — Other Ambulatory Visit: Payer: Self-pay | Admitting: Internal Medicine

## 2015-11-27 DIAGNOSIS — R410 Disorientation, unspecified: Secondary | ICD-10-CM

## 2015-11-28 ENCOUNTER — Ambulatory Visit: Payer: Medicare Other | Admitting: Oncology

## 2015-12-01 MED ORDER — ONDANSETRON HCL 8 MG PO TABS: 8.0000 mg | ORAL_TABLET | Freq: Two times a day (BID) | ORAL | 1 refills | Status: DC | PRN

## 2015-12-01 MED ORDER — PROCHLORPERAZINE MALEATE 10 MG PO TABS: 10.0000 mg | ORAL_TABLET | Freq: Four times a day (QID) | ORAL | 1 refills | Status: AC | PRN

## 2015-12-01 NOTE — Progress Notes (Signed)
START ON PATHWAY REGIMEN - Non-Small Cell Lung  [id number]: Pembrolizumab 200 mg q21 Days Until Disease Progression, Unacceptable Toxicity, or up to 24 Months   A cycle is 21 days:     Pembrolizumab [facility])) 200 mg flat dose in 50 mL NS IV over 30 minutes every 21 days.  Inline filter required (low-protein binding) Dose Mod: None Additional Orders: Severe immune-mediated reactions can occur. See prescribing information for more details and required immediate management with steroids. Monitor thyroid, renal, liver function tests, glucose, and sodium at baseline and before each  dose of pembrolizumab. Ref: Keytruda(R) (pembrolizumab) prescribing information, 2016.  **Always confirm dose/schedule in your pharmacy ordering system**    Patient Characteristics: Stage IV Metastatic, Non Squamous, Initial Chemotherapy/Immunotherapy, PS = 0, 1, PD-L1 Expression Positive  >= 50% (TPS) AJCC M Stage: X AJCC N Stage: X AJCC T Stage: X Current Disease Status: Distant Metastases AJCC Stage Grouping: IV Histology: Non Squamous Cell ROS1 Rearrangement Status: Awaiting Test Results T790M Mutation Status: Not Applicable - EGFR Mutation Negative/Unknown Other Mutations/Biomarkers: No Other Actionable Mutations PD-L1 Expression Status: PD-L1 Positive >= 50% (TPS) Chemotherapy/Immunotherapy LOT: Initial Chemotherapy/Immunotherapy Molecular Targeted Therapy: Not Appropriate ALK Translocation Status: Awaiting Test Results Would you be surprised if this patient died  in the next year? I would NOT be surprised if this patient died in the next year EGFR Mutation Status: Awaiting Test Results BRAF V600E Mutation Status: Awaiting Test Results Performance Status: PS = 0, 1  Intent of Therapy: Non-Curative / Palliative Intent, Discussed with Patient

## 2015-12-02 LAB — PATHOLOGY

## 2015-12-02 LAB — SURGICAL PATHOLOGY

## 2015-12-04 ENCOUNTER — Encounter: Payer: Self-pay | Admitting: Radiation Oncology

## 2015-12-04 ENCOUNTER — Ambulatory Visit
Admission: RE | Admit: 2015-12-04 | Discharge: 2015-12-04 | Disposition: A | Discharge status: Home / Self Care | Payer: Medicare Other | Source: Ambulatory Visit | Attending: Radiation Oncology | Admitting: Radiation Oncology

## 2015-12-04 VITALS — BP 92/52 | HR 90 | Temp 99.6°F | Resp 20 | Wt 200.3 lb

## 2015-12-04 DIAGNOSIS — C3490 Malignant neoplasm of unspecified part of unspecified bronchus or lung: Secondary | ICD-10-CM

## 2015-12-04 DIAGNOSIS — I10 Essential (primary) hypertension: Secondary | ICD-10-CM | POA: Diagnosis not present

## 2015-12-04 DIAGNOSIS — I2510 Atherosclerotic heart disease of native coronary artery without angina pectoris: Secondary | ICD-10-CM | POA: Diagnosis not present

## 2015-12-04 DIAGNOSIS — I252 Old myocardial infarction: Secondary | ICD-10-CM | POA: Diagnosis not present

## 2015-12-04 DIAGNOSIS — G939 Disorder of brain, unspecified: Secondary | ICD-10-CM | POA: Diagnosis not present

## 2015-12-04 DIAGNOSIS — R599 Enlarged lymph nodes, unspecified: Secondary | ICD-10-CM | POA: Diagnosis not present

## 2015-12-04 DIAGNOSIS — G20 Parkinson's disease: Secondary | ICD-10-CM | POA: Insufficient documentation

## 2015-12-04 DIAGNOSIS — Z8042 Family history of malignant neoplasm of prostate: Secondary | ICD-10-CM | POA: Insufficient documentation

## 2015-12-04 DIAGNOSIS — Z7982 Long term (current) use of aspirin: Secondary | ICD-10-CM | POA: Insufficient documentation

## 2015-12-04 DIAGNOSIS — F0390 Unspecified dementia without behavioral disturbance: Secondary | ICD-10-CM | POA: Insufficient documentation

## 2015-12-04 DIAGNOSIS — R011 Cardiac murmur, unspecified: Secondary | ICD-10-CM | POA: Diagnosis not present

## 2015-12-04 DIAGNOSIS — Z8781 Personal history of (healed) traumatic fracture: Secondary | ICD-10-CM | POA: Insufficient documentation

## 2015-12-04 DIAGNOSIS — E785 Hyperlipidemia, unspecified: Secondary | ICD-10-CM | POA: Insufficient documentation

## 2015-12-04 DIAGNOSIS — Z86010 Personal history of colonic polyps: Secondary | ICD-10-CM | POA: Diagnosis not present

## 2015-12-04 DIAGNOSIS — Z79899 Other long term (current) drug therapy: Secondary | ICD-10-CM | POA: Insufficient documentation

## 2015-12-04 DIAGNOSIS — Z7984 Long term (current) use of oral hypoglycemic drugs: Secondary | ICD-10-CM | POA: Insufficient documentation

## 2015-12-04 DIAGNOSIS — N400 Enlarged prostate without lower urinary tract symptoms: Secondary | ICD-10-CM | POA: Diagnosis not present

## 2015-12-04 DIAGNOSIS — C3431 Malignant neoplasm of lower lobe, right bronchus or lung: Secondary | ICD-10-CM | POA: Diagnosis not present

## 2015-12-04 DIAGNOSIS — E119 Type 2 diabetes mellitus without complications: Secondary | ICD-10-CM | POA: Diagnosis not present

## 2015-12-04 DIAGNOSIS — C7951 Secondary malignant neoplasm of bone: Secondary | ICD-10-CM | POA: Insufficient documentation

## 2015-12-04 DIAGNOSIS — N529 Male erectile dysfunction, unspecified: Secondary | ICD-10-CM | POA: Diagnosis not present

## 2015-12-04 DIAGNOSIS — Z87891 Personal history of nicotine dependence: Secondary | ICD-10-CM | POA: Diagnosis not present

## 2015-12-04 DIAGNOSIS — R269 Unspecified abnormalities of gait and mobility: Secondary | ICD-10-CM | POA: Diagnosis not present

## 2015-12-04 NOTE — Patient Instructions (Signed)
Pembrolizumab injection What is this medicine? PEMBROLIZUMAB (pem broe liz ue mab) is a monoclonal antibody. It is used to treat melanoma and non-small cell lung cancer. This medicine may be used for other purposes; ask your health care provider or pharmacist if you have questions. What should I tell my health care provider before I take this medicine? They need to know if you have any of these conditions: -diabetes -immune system problems -inflammatory bowel disease -liver disease -lung or breathing disease -lupus -an unusual or allergic reaction to pembrolizumab, other medicines, foods, dyes, or preservatives -pregnant or trying to get pregnant -breast-feeding How should I use this medicine? This medicine is for infusion into a vein. It is given by a health care professional in a hospital or clinic setting. A special MedGuide will be given to you before each treatment. Be sure to read this information carefully each time. Talk to your pediatrician regarding the use of this medicine in children. Special care may be needed. Overdosage: If you think you have taken too much of this medicine contact a poison control center or emergency room at once. NOTE: This medicine is only for you. Do not share this medicine with others. What if I miss a dose? It is important not to miss your dose. Call your doctor or health care professional if you are unable to keep an appointment. What may interact with this medicine? Interactions have not been studied. Give your health care provider a list of all the medicines, herbs, non-prescription drugs, or dietary supplements you use. Also tell them if you smoke, drink alcohol, or use illegal drugs. Some items may interact with your medicine. This list may not describe all possible interactions. Give your health care provider a list of all the medicines, herbs, non-prescription drugs, or dietary supplements you use. Also tell them if you smoke, drink alcohol, or  use illegal drugs. Some items may interact with your medicine. What should I watch for while using this medicine? Your condition will be monitored carefully while you are receiving this medicine. You may need blood work done while you are taking this medicine. Do not become pregnant while taking this medicine or for 4 months after stopping it. Women should inform their doctor if they wish to become pregnant or think they might be pregnant. There is a potential for serious side effects to an unborn child. Talk to your health care professional or pharmacist for more information. Do not breast-feed an infant while taking this medicine or for 4 months after the last dose. What side effects may I notice from receiving this medicine? Side effects that you should report to your doctor or health care professional as soon as possible: -allergic reactions like skin rash, itching or hives, swelling of the face, lips, or tongue -bloody or black, tarry stools -breathing problems -change in the amount of urine -changes in vision -chest pain -chills -dark urine -dizziness or feeling faint or lightheaded -fast or irregular heartbeat -fever -flushing -hair loss -muscle pain -muscle weakness -persistent headache -signs and symptoms of high blood sugar such as dizziness; dry mouth; dry skin; fruity breath; nausea; stomach pain; increased hunger or thirst; increased urination -signs and symptoms of liver injury like dark urine, light-colored stools, loss of appetite, nausea, right upper belly pain, yellowing of the eyes or skin -stomach pain -weight loss Side effects that usually do not require medical attention (Report these to your doctor or health care professional if they continue or are bothersome.):constipation -cough -diarrhea -joint pain -  tiredness This list may not describe all possible side effects. Call your doctor for medical advice about side effects. You may report side effects to FDA at  1-800-FDA-1088. Where should I keep my medicine? This drug is given in a hospital or clinic and will not be stored at home. NOTE: This sheet is a summary. It may not cover all possible information. If you have questions about this medicine, talk to your doctor, pharmacist, or health care provider.    2016, Elsevier/Gold Standard. (2014-03-20 17:24:19)

## 2015-12-04 NOTE — Consult Note (Signed)
NEW PATIENT EVALUATION  Name: Craig Dennis.  MRN: [id number]  Date:   12/04/2015     DOB: 1933/03/02   This 82 y.o. male patient presents to the clinic for initial evaluation of left lateral rib metastases from stage for adenocarcinoma of the right lower lung.  REFERRING PHYSICIAN: Idelle Crouch, MD  CHIEF COMPLAINT:  Chief Complaint  Patient presents with  . Lung Cancer    Pt is here for initial consultation of lung cancer with mets to rib.     DIAGNOSIS: The encounter diagnosis was Lung cancer metastatic to bone [facility]).   PREVIOUS INVESTIGATIONS:  PET CT scan and CT scans reviewed Pathology report reviewed Clinical notes reviewed  HPI: Patient is an 82 year old male who unfortunately I am also seen today with his wife who is also a patient with breast cancer. His history dates back to August 2017 was seen in the emergency room for a fall and CT scan at that time showed a right lower lobe mass highly suspicious for malignancy. He's been having some left lateral rib pain. Patient is also known to have mediastinal adenopathy. Patient is also known to have enlargement of the adrenal glands consistent with metastatic disease underwent CT-guided biopsy which was positive for adenocarcinoma consistent with lung primary. PET CT scan showed hypermetabolic activity in the right lower lobe consistent with primary bronchogenic carcinoma as well as hypermetabolic bilateral adrenal metastasis para-aortic retroperitoneal lymph nodes. Also had 6 left posterior rib involvement as well as a small focus at C1 and medial left iliac bone. Patient has been complaining of lower rib pain and is seen today for consideration of palliative treatment. He specifically denies cough hemoptysis or chest tightness. Patient has evidence of early dementia and previous CT scan of his head back in August 2017 showed periventricular white matter hypodensities favoring chronic ischemic microvascular white matter  disease. He is seen today for consideration of palliative radiation to his ribs. He is accompanied by his wife today who is also being seen for initial evaluation of breast cancer.   PLANNED TREATMENT REGIMEN: Palliative radiation therapy to left lateral ribs  PAST MEDICAL HISTORY:  has a past medical history of BPH (benign prostatic hyperplasia); CAD (coronary artery disease); Cancer ([place]); Colon polyps; Diabetes ([place]); Dyslipidemia; Erectile dysfunction; Gait disorder; Heart murmur; History of chest pain; History of headache; HOH (hard of hearing); Hypertension; Myocardial infarction; Parkinson's disease; Parkinson's disease ([place]); and Plantar fasciitis.    PAST SURGICAL HISTORY:  Past Surgical History:  Procedure Laterality Date  . cardicac stent    . CATARACT EXTRACTION W/PHACO Right 06/28/2014   Procedure: CATARACT EXTRACTION PHACO AND INTRAOCULAR LENS PLACEMENT (IOC);  Surgeon: Lyla Glassing, MD;  Location: [facility] ORS;  Service: Ophthalmology;  Laterality: Right;  [place]    1:07.4               AP     12.6             CDE   8.50  . CORONARY ANGIOPLASTY     stent  . HEMORRHOID SURGERY    . TONSILLECTOMY AND ADENOIDECTOMY      FAMILY HISTORY: family history includes Diabetes in his mother; Heart attack in his mother; Heart disease in his father and mother; Prostate cancer in his father; Tremor in his mother.  SOCIAL HISTORY:  reports that he quit smoking about 60 years ago. He has never used smokeless tobacco. He reports that he does not drink alcohol or use drugs.  ALLERGIES: Review of patient's allergies indicates no known allergies.  MEDICATIONS:  Current Outpatient Prescriptions  Medication Sig Dispense Refill  . ALPRAZolam (XANAX) 0.25 MG tablet Take 0.25 mg by mouth at bedtime as needed for anxiety.    Marland Kitchen aspirin EC 81 MG tablet Take 81 mg by mouth daily.    . butalbital-aspirin-caffeine (FIORINAL) 50-325-40 MG capsule Take 1 capsule by mouth 4 (four) times daily as needed for  migraine.    . carbidopa-levodopa (SINEMET CR) 50-200 MG per tablet Take 7 tablets by mouth daily. Pt takes 2 tablet at 7am, 2 tablets at 12 pm, and one tablet at 2pm.    . donepezil (ARICEPT) 5 MG tablet Take 5 mg by mouth daily.    . fludrocortisone (FLORINEF) 0.1 MG tablet Take 0.1 mg by mouth 2 (two) times daily.    Marland Kitchen glimepiride (AMARYL) 2 MG tablet Take 1 mg by mouth 2 (two) times daily.     Marland Kitchen linagliptin (TRADJENTA) 5 MG TABS tablet Take 5 mg by mouth daily.    . metFORMIN (GLUCOPHAGE) 1000 MG tablet Take 1,000 mg by mouth 2 (two) times daily.     . ondansetron (ZOFRAN) 8 MG tablet Take 1 tablet (8 mg total) by mouth 2 (two) times daily as needed (Nausea or vomiting). 30 tablet 1  . prochlorperazine (COMPAZINE) 10 MG tablet Take 1 tablet (10 mg total) by mouth every 6 (six) hours as needed (Nausea or vomiting). 30 tablet 1  . rasagiline (AZILECT) 1 MG TABS tablet Take 1 mg by mouth at bedtime.     Marland Kitchen rOPINIRole (REQUIP) 4 MG tablet Take 14 mg by mouth at bedtime. Pt takes with two '5mg'$  tablets.    . simvastatin (ZOCOR) 40 MG tablet Take 40 mg by mouth at bedtime.    . tamsulosin (FLOMAX) 0.4 MG CAPS capsule Take 0.8 mg by mouth daily at 12 noon. At 12 noon.    . traMADol (ULTRAM) 50 MG tablet Take 1 tablet (50 mg total) by mouth every 6 (six) hours as needed. 30 tablet 0  . triamcinolone cream (KENALOG) 0.1 % APPLY TWICE DAILY TO AFFECTED AREAS ON LEGS AS NEEDED  0  . amantadine (SYMMETREL) 100 MG capsule Take 100 mg by mouth 2 (two) times daily.     No current facility-administered medications for this encounter.     ECOG PERFORMANCE STATUS:  2 - Symptomatic, <50% confined to bed  REVIEW OF SYSTEMS: Except for the bony pain patient does not complain of any pelvic pain or neck pain. Patient denies any weight loss, fatigue, weakness, fever, chills or night sweats. Patient denies any loss of vision, blurred vision. Patient denies any ringing  of the ears or hearing loss. No irregular  heartbeat. Patient denies heart murmur or history of fainting. Patient denies any chest pain or pain radiating to her upper extremities. Patient denies any shortness of breath, difficulty breathing at night, cough or hemoptysis. Patient denies any swelling in the lower legs. Patient denies any nausea vomiting, vomiting of blood, or coffee ground material in the vomitus. Patient denies any stomach pain. Patient states has had normal bowel movements no significant constipation or diarrhea. Patient denies any dysuria, hematuria or significant nocturia. Patient denies any problems walking, swelling in the joints or loss of balance. Patient denies any skin changes, loss of hair or loss of weight. Patient denies any excessive worrying or anxiety or significant depression. Patient denies any problems with insomnia. Patient denies excessive thirst, polyuria, polydipsia. Patient denies  any swollen glands, patient denies easy bruising or easy bleeding. Patient denies any recent infections, allergies or URI. Patient "s visual fields have not changed significantly in recent time.    PHYSICAL EXAM: BP (!) 92/52   Pulse 90   Temp 99.6 F (37.6 C)   Resp 20   Wt 200 lb 4.6 oz (90.9 kg)   BMI 26.42 kg/m  A well-developed elderly male with obvious signs of dementia wheelchair-bound in NAD. Deep palpation of his left lateral ribs does not elicit pain. Range of motion of his neck does not elicit pain. He does have some mild decreased breath sounds in his right lower lobe. Well-developed well-nourished patient in NAD. HEENT reveals PERLA, EOMI, discs not visualized.  Oral cavity is clear. No oral mucosal lesions are identified. Neck is clear without evidence of cervical or supraclavicular adenopathy. Lungs are clear to A&P. Cardiac examination is essentially unremarkable with regular rate and rhythm without murmur rub or thrill. Abdomen is benign with no organomegaly or masses noted. Motor sensory and DTR levels are equal  and symmetric in the upper and lower extremities. Cranial nerves II through XII are grossly intact. Proprioception is intact. No peripheral adenopathy or edema is identified. No motor or sensory levels are noted. Crude visual fields are within normal range.  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 Would plan on delivering 3000 cGy in 10 fractions. I have set up and personally ordered CT simulation for early next week. Patient is scheduled to start [place] under the direction of medical oncology. Should his right lower lobe disease progress may offer a short palliative course of radiation therapy to try to prevent further atelectasis of his right lung hemoptysis or chest tightness. Risks and benefits of treatment including possible skin reaction fatigue alteration of blood counts all were discussed with the patient and his wife. They both seem to compress my treatment plan well.  I would like to take this opportunity to thank you for allowing me to participate in the care of your patient.Armstead Peaks., MD

## 2015-12-05 ENCOUNTER — Inpatient Hospital Stay: Payer: Medicare Other | Attending: Oncology

## 2015-12-05 DIAGNOSIS — I959 Hypotension, unspecified: Secondary | ICD-10-CM | POA: Insufficient documentation

## 2015-12-05 DIAGNOSIS — Z87891 Personal history of nicotine dependence: Secondary | ICD-10-CM | POA: Insufficient documentation

## 2015-12-05 DIAGNOSIS — N529 Male erectile dysfunction, unspecified: Secondary | ICD-10-CM | POA: Insufficient documentation

## 2015-12-05 DIAGNOSIS — Z85828 Personal history of other malignant neoplasm of skin: Secondary | ICD-10-CM | POA: Insufficient documentation

## 2015-12-05 DIAGNOSIS — Z7984 Long term (current) use of oral hypoglycemic drugs: Secondary | ICD-10-CM | POA: Insufficient documentation

## 2015-12-05 DIAGNOSIS — N400 Enlarged prostate without lower urinary tract symptoms: Secondary | ICD-10-CM | POA: Insufficient documentation

## 2015-12-05 DIAGNOSIS — E785 Hyperlipidemia, unspecified: Secondary | ICD-10-CM | POA: Insufficient documentation

## 2015-12-05 DIAGNOSIS — I2510 Atherosclerotic heart disease of native coronary artery without angina pectoris: Secondary | ICD-10-CM | POA: Insufficient documentation

## 2015-12-05 DIAGNOSIS — C7972 Secondary malignant neoplasm of left adrenal gland: Secondary | ICD-10-CM | POA: Insufficient documentation

## 2015-12-05 DIAGNOSIS — Z5112 Encounter for antineoplastic immunotherapy: Secondary | ICD-10-CM | POA: Insufficient documentation

## 2015-12-05 DIAGNOSIS — C7951 Secondary malignant neoplasm of bone: Secondary | ICD-10-CM | POA: Insufficient documentation

## 2015-12-05 DIAGNOSIS — Z79899 Other long term (current) drug therapy: Secondary | ICD-10-CM | POA: Insufficient documentation

## 2015-12-05 DIAGNOSIS — C3431 Malignant neoplasm of lower lobe, right bronchus or lung: Secondary | ICD-10-CM | POA: Insufficient documentation

## 2015-12-05 DIAGNOSIS — G20 Parkinson's disease: Secondary | ICD-10-CM | POA: Insufficient documentation

## 2015-12-05 DIAGNOSIS — R413 Other amnesia: Secondary | ICD-10-CM | POA: Insufficient documentation

## 2015-12-05 DIAGNOSIS — Z7982 Long term (current) use of aspirin: Secondary | ICD-10-CM | POA: Insufficient documentation

## 2015-12-05 DIAGNOSIS — Z8042 Family history of malignant neoplasm of prostate: Secondary | ICD-10-CM | POA: Insufficient documentation

## 2015-12-05 DIAGNOSIS — R350 Frequency of micturition: Secondary | ICD-10-CM | POA: Insufficient documentation

## 2015-12-05 DIAGNOSIS — E119 Type 2 diabetes mellitus without complications: Secondary | ICD-10-CM | POA: Insufficient documentation

## 2015-12-05 DIAGNOSIS — R109 Unspecified abdominal pain: Secondary | ICD-10-CM | POA: Insufficient documentation

## 2015-12-05 DIAGNOSIS — I10 Essential (primary) hypertension: Secondary | ICD-10-CM | POA: Insufficient documentation

## 2015-12-05 DIAGNOSIS — Z86010 Personal history of colonic polyps: Secondary | ICD-10-CM | POA: Insufficient documentation

## 2015-12-05 DIAGNOSIS — I252 Old myocardial infarction: Secondary | ICD-10-CM | POA: Insufficient documentation

## 2015-12-05 DIAGNOSIS — R269 Unspecified abnormalities of gait and mobility: Secondary | ICD-10-CM | POA: Insufficient documentation

## 2015-12-05 DIAGNOSIS — D649 Anemia, unspecified: Secondary | ICD-10-CM | POA: Insufficient documentation

## 2015-12-06 ENCOUNTER — Telehealth: Payer: Self-pay | Admitting: *Deleted

## 2015-12-06 DIAGNOSIS — C3431 Malignant neoplasm of lower lobe, right bronchus or lung: Secondary | ICD-10-CM

## 2015-12-06 MED ORDER — ONDANSETRON HCL 8 MG PO TABS: 8.0000 mg | ORAL_TABLET | Freq: Two times a day (BID) | ORAL | 1 refills | Status: DC | PRN

## 2015-12-06 NOTE — Telephone Encounter (Signed)
Ondansetron denied, please call to [facility] and they will fill it for him with out a PA

## 2015-12-08 ENCOUNTER — Other Ambulatory Visit: Payer: Self-pay | Admitting: Oncology

## 2015-12-08 NOTE — Progress Notes (Signed)
[facility]  Telephone:([phone] [phone]  ID: Craig Dennis. 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   INTERVAL HISTORY: Patient returns to clinic today for further evaluation and initiation of Keytruda. He continues to have left flank pain in the area of his known metastasis in his sixth rib. He otherwise feels well and is asymptomatic. He has no neurologic complaints. He denies any recent fevers. He denies any other pain.  He has a good appetite and denies weight loss. He denies any chest pain, shortness of breath, cough, or hemoptysis. He has no nausea, vomiting, constipation, or diarrhea. He has no urinary complaints. Patient offers no further specific complaints today.  REVIEW OF SYSTEMS:   Review of Systems  Constitutional: Negative.  Negative for fever, malaise/fatigue and weight loss.  Respiratory: Negative.  Negative for cough, hemoptysis and shortness of breath.   Cardiovascular: Negative.  Negative for chest pain and leg swelling.  Gastrointestinal: Negative.  Negative for abdominal pain.  Genitourinary: Positive for frequency. Negative for flank pain.  Neurological: Positive for tremors. Negative for sensory change, focal weakness and weakness.  Psychiatric/Behavioral: Positive for memory loss. The patient is not nervous/anxious.     As per HPI. Otherwise, a complete review of systems is negative.  PAST MEDICAL HISTORY: Past Medical History:  Diagnosis Date  . BPH (benign prostatic hyperplasia)   . CAD (coronary artery disease)   . Cancer ([place])    skin  . Colon polyps   . Diabetes ([place])   . Dyslipidemia   . Erectile dysfunction   . Gait disorder   . Heart murmur   . History of  chest pain   . History of headache    chronic, recurrent, postconcussive, after fall from a collapsed deck, had been treated wtih prn fiorinal  . HOH (hard of hearing)   . Hypertension   . Lung cancer ([place])   . Myocardial infarction   . Parkinson's disease    Primarily right sided features  . Parkinson's disease ([place])   . Plantar fasciitis     PAST SURGICAL HISTORY: Past Surgical History:  Procedure Laterality Date  . cardicac stent    . CATARACT EXTRACTION W/PHACO Right 06/28/2014   Procedure: CATARACT EXTRACTION PHACO AND INTRAOCULAR LENS PLACEMENT (IOC);  Surgeon: Lyla Glassing, MD;  Location: [facility] ORS;  Service: Ophthalmology;  Laterality: Right;  [place]    1:07.4               AP     12.6             CDE   8.50  . CORONARY ANGIOPLASTY     stent  . HEMORRHOID SURGERY    . TONSILLECTOMY AND ADENOIDECTOMY      FAMILY HISTORY: Family History  Problem Relation Age of Onset  . Heart disease Mother   . Heart attack Mother   . Diabetes Mother   . Tremor Mother     Benign essential tremor, but not parkinson's disease  . Prostate cancer Father   . Heart disease Father     ADVANCED DIRECTIVES (Y/N):  N  HEALTH MAINTENANCE: Social History  Substance Use Topics  . Smoking status: Former Smoker    Quit date: 02/03/1955  . Smokeless tobacco:  Never Used  . Alcohol use No     Colonoscopy:  PAP:  Bone density:  Lipid panel:  Allergies  Allergen Reactions  . Tramadol Other (See Comments)    Current Outpatient Prescriptions  Medication Sig Dispense Refill  . ALPRAZolam (XANAX) 0.25 MG tablet Take 0.25 mg by mouth at bedtime as needed for anxiety.    Marland Kitchen amantadine (SYMMETREL) 100 MG capsule Take 100 mg by mouth 2 (two) times daily.    Marland Kitchen aspirin EC 81 MG tablet Take 81 mg by mouth daily.    . butalbital-aspirin-caffeine (FIORINAL) 50-325-40 MG capsule Take 1 capsule by mouth 4 (four) times daily as needed for migraine.    . carbidopa-levodopa (SINEMET CR) 50-200 MG per tablet  Take 7 tablets by mouth daily. Pt takes 2 tablet at 7am, 2 tablets at 12 pm, and one tablet at 2pm.    . donepezil (ARICEPT) 5 MG tablet Take 5 mg by mouth daily.    . fludrocortisone (FLORINEF) 0.1 MG tablet Take 0.1 mg by mouth 2 (two) times daily.    Marland Kitchen glimepiride (AMARYL) 2 MG tablet Take 1 mg by mouth 2 (two) times daily.     Marland Kitchen linagliptin (TRADJENTA) 5 MG TABS tablet Take 5 mg by mouth daily.    . metFORMIN (GLUCOPHAGE) 1000 MG tablet Take 1,000 mg by mouth 2 (two) times daily.     . ondansetron (ZOFRAN) 8 MG tablet Take 1 tablet (8 mg total) by mouth 2 (two) times daily as needed (Nausea or vomiting). 30 tablet 1  . prochlorperazine (COMPAZINE) 10 MG tablet Take 1 tablet (10 mg total) by mouth every 6 (six) hours as needed (Nausea or vomiting). 30 tablet 1  . rasagiline (AZILECT) 1 MG TABS tablet Take 1 mg by mouth at bedtime.     Marland Kitchen rOPINIRole (REQUIP) 4 MG tablet Take 14 mg by mouth at bedtime. Pt takes with two 72m tablets.    . simvastatin (ZOCOR) 40 MG tablet Take 40 mg by mouth at bedtime.    . tamsulosin (FLOMAX) 0.4 MG CAPS capsule Take 0.8 mg by mouth daily at 12 noon. At 12 noon.    . triamcinolone cream (KENALOG) 0.1 % APPLY TWICE DAILY TO AFFECTED AREAS ON LEGS AS NEEDED  0   No current facility-administered medications for this visit.    Facility-Administered Medications Ordered in Other Visits  Medication Dose Route Frequency Provider Last Rate Last Dose  . 0.9 %  sodium chloride infusion   Intravenous Once TLloyd Huger MD      . pembrolizumab ([facility] 200 mg in sodium chloride 0.9 % 50 mL chemo infusion  200 mg Intravenous Once TLloyd Huger MD        OBJECTIVE: Vitals:   12/09/15 1358  BP: 123/75  Pulse: 76  Temp: 99 F (37.2 C)     Body mass index is 25.01 kg/m.    ECOG FS:1 - Symptomatic but completely ambulatory  General: Well-developed, well-nourished, no acute distress. Sitting in a wheelchair.  Eyes: Pink conjunctiva, anicteric  sclera. Lungs: Clear to auscultation bilaterally. Heart: Regular rate and rhythm. No rubs, murmurs, or gallops. Abdomen: Soft, nontender, nondistended. No organomegaly noted, normoactive bowel sounds. Musculoskeletal: No edema, cyanosis, or clubbing. Neuro: Alert, answering all questions appropriately. Cranial nerves grossly intact. Skin: No rashes or petechiae noted. Psych: Normal affect.   LAB RESULTS:  Lab Results  Component Value Date   NA 139 12/09/2015   K 3.6 12/09/2015   CL 100 (L)  12/09/2015   CO2 32 12/09/2015   GLUCOSE 76 12/09/2015   BUN 11 12/09/2015   CREATININE 0.73 12/09/2015   CALCIUM 8.9 12/09/2015   PROT 7.1 12/09/2015   ALBUMIN 2.8 (L) 12/09/2015   AST 30 12/09/2015   ALT 8 (L) 12/09/2015   ALKPHOS 122 12/09/2015   BILITOT 0.4 12/09/2015   GFRNONAA >60 12/09/2015   GFRAA >60 12/09/2015    Lab Results  Component Value Date   WBC 7.9 12/09/2015   NEUTROABS 5.9 12/09/2015   HGB 10.2 (L) 12/09/2015   HCT 31.6 (L) 12/09/2015   MCV 96.0 12/09/2015   PLT 346 12/09/2015     STUDIES: Ct Biopsy  Result Date: 12-11-15 INDICATION: Concern for metastatic lung cancer. Please perform CT-guided left adrenal gland mass biopsy for tissue diagnostic purposes EXAM: CT BIOPSY INDETERMINATE LEFT ADRENAL GLAND MASS COMPARISON:  PET-CT - 10/29/2015 ; chest CT- 10/21/2015 MEDICATIONS: None. ANESTHESIA/SEDATION: Fentanyl 50 mcg IV; Versed 0.5 mg IV Sedation time: 20 minutes; The patient was continuously monitored during the procedure by the interventional radiology nurse under my direct supervision. CONTRAST:  None. COMPLICATIONS: None immediate. PROCEDURE: Informed consent was obtained from the patient following an explanation of the procedure, risks, benefits and alternatives. A time out was performed prior to the initiation of the procedure. The patient was positioned prone on the CT table and a limited CT was performed for procedural planning demonstrating unchanged  size and appearance of dominant approximately 5.2 x 4.7 cm left adrenal gland mass (image 31, series 2). The procedure was planned. The operative site was prepped and draped in the usual sterile fashion. Appropriate trajectory was confirmed with a 22 gauge spinal needle after the adjacent tissues were anesthetized with 1% Lidocaine with epinephrine. Under intermittent CT guidance, a 17 gauge coaxial needle was advanced into the peripheral aspect of the mass. Appropriate positioning was confirmed and 5 core needle biopsy samples were obtained with an 18 gauge core needle biopsy device. The co-axial needle was removed and hemostasis was achieved with manual compression. A limited postprocedural CT was negative for hemorrhage or additional complication. A dressing was placed. The patient tolerated the procedure well without immediate postprocedural complication. IMPRESSION: Technically successful CT guided core needle biopsy of indeterminate left adrenal gland mass. Electronically Signed   By: Sandi Mariscal M.D.   On: 11-Dec-2015 10:47    ASSESSMENT: Stage IV adenocarcinoma of the right lower lobe lung with adrenal and bony metastasis.  PLAN:    1. 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 The differential diagnosis based on the pattern of immunoreactivity includes metastatic gastrointestinal adenocarcinoma and pulmonary adenocarcinoma with intestinal / enteric differentiation. MSI testing has been ordered. PET scan results reviewed independently and reported as above with adrenal metastasis, retroperitoneal disease, as well as PET positive bone lesions. Patient still wishes to pursue aggressive treatment. Given patient's advanced age and decreased performance status, we will not use chemotherapy and pursue immunotherapy with Keytruda. Proceed with cycle 1 today.  Return to clinic in 3 weeks for consideration of cycle 2. Plan to reimage after cycle 4.  2. Urinary frequency: UA and culture are negative. Monitor. 3. Anemia: Mild, monitor.  Patient expressed understanding and was in agreement with this plan. He also understands that He can call clinic at any time with any questions, concerns, or complaints.   Primary cancer of right lower  lobe of lung [facility])   Staging form: Lung, AJCC 7th Edition   - Clinical stage from 12/01/2015: Stage IV (T2b, N2, M1b) - Signed by Lloyd Huger, MD on 12/01/2015  Lloyd Huger, MD   12/09/2015 2:21 PM

## 2015-12-09 ENCOUNTER — Inpatient Hospital Stay: Payer: Medicare Other

## 2015-12-09 ENCOUNTER — Encounter: Payer: Self-pay | Admitting: Oncology

## 2015-12-09 ENCOUNTER — Inpatient Hospital Stay (HOSPITAL_BASED_OUTPATIENT_CLINIC_OR_DEPARTMENT_OTHER): Payer: Medicare Other | Admitting: Oncology

## 2015-12-09 ENCOUNTER — Other Ambulatory Visit: Payer: Self-pay | Admitting: Oncology

## 2015-12-09 VITALS — BP 123/75 | HR 76 | Temp 99.0°F | Wt 189.6 lb

## 2015-12-09 DIAGNOSIS — N400 Enlarged prostate without lower urinary tract symptoms: Secondary | ICD-10-CM

## 2015-12-09 DIAGNOSIS — C7951 Secondary malignant neoplasm of bone: Secondary | ICD-10-CM

## 2015-12-09 DIAGNOSIS — C7972 Secondary malignant neoplasm of left adrenal gland: Secondary | ICD-10-CM | POA: Diagnosis not present

## 2015-12-09 DIAGNOSIS — C3431 Malignant neoplasm of lower lobe, right bronchus or lung: Secondary | ICD-10-CM

## 2015-12-09 DIAGNOSIS — Z8042 Family history of malignant neoplasm of prostate: Secondary | ICD-10-CM

## 2015-12-09 DIAGNOSIS — E119 Type 2 diabetes mellitus without complications: Secondary | ICD-10-CM

## 2015-12-09 DIAGNOSIS — I2510 Atherosclerotic heart disease of native coronary artery without angina pectoris: Secondary | ICD-10-CM | POA: Insufficient documentation

## 2015-12-09 DIAGNOSIS — Z7984 Long term (current) use of oral hypoglycemic drugs: Secondary | ICD-10-CM

## 2015-12-09 DIAGNOSIS — Z87891 Personal history of nicotine dependence: Secondary | ICD-10-CM

## 2015-12-09 DIAGNOSIS — G20 Parkinson's disease: Secondary | ICD-10-CM | POA: Diagnosis not present

## 2015-12-09 DIAGNOSIS — R350 Frequency of micturition: Secondary | ICD-10-CM

## 2015-12-09 DIAGNOSIS — Z85828 Personal history of other malignant neoplasm of skin: Secondary | ICD-10-CM | POA: Diagnosis not present

## 2015-12-09 DIAGNOSIS — R413 Other amnesia: Secondary | ICD-10-CM | POA: Diagnosis not present

## 2015-12-09 DIAGNOSIS — I959 Hypotension, unspecified: Secondary | ICD-10-CM | POA: Diagnosis not present

## 2015-12-09 DIAGNOSIS — Z79899 Other long term (current) drug therapy: Secondary | ICD-10-CM | POA: Diagnosis not present

## 2015-12-09 DIAGNOSIS — I252 Old myocardial infarction: Secondary | ICD-10-CM | POA: Diagnosis not present

## 2015-12-09 DIAGNOSIS — N529 Male erectile dysfunction, unspecified: Secondary | ICD-10-CM | POA: Diagnosis not present

## 2015-12-09 DIAGNOSIS — I10 Essential (primary) hypertension: Secondary | ICD-10-CM

## 2015-12-09 DIAGNOSIS — R109 Unspecified abdominal pain: Secondary | ICD-10-CM

## 2015-12-09 DIAGNOSIS — Z5112 Encounter for antineoplastic immunotherapy: Secondary | ICD-10-CM | POA: Diagnosis not present

## 2015-12-09 DIAGNOSIS — R269 Unspecified abnormalities of gait and mobility: Secondary | ICD-10-CM

## 2015-12-09 DIAGNOSIS — Z86010 Personal history of colonic polyps: Secondary | ICD-10-CM | POA: Diagnosis not present

## 2015-12-09 DIAGNOSIS — E785 Hyperlipidemia, unspecified: Secondary | ICD-10-CM

## 2015-12-09 DIAGNOSIS — D649 Anemia, unspecified: Secondary | ICD-10-CM | POA: Diagnosis not present

## 2015-12-09 DIAGNOSIS — Z7982 Long term (current) use of aspirin: Secondary | ICD-10-CM

## 2015-12-09 LAB — CBC WITH DIFFERENTIAL/PLATELET
Basophils Absolute: 0 10*3/uL (ref 0–0.1)
Basophils Relative: 1 %
EOS ABS: 0.1 10*3/uL (ref 0–0.7)
EOS PCT: 2 %
HCT: 31.6 % — ABNORMAL LOW (ref 40.0–52.0)
Hemoglobin: 10.2 g/dL — ABNORMAL LOW (ref 13.0–18.0)
LYMPHS ABS: 1.2 10*3/uL (ref 1.0–3.6)
LYMPHS PCT: 15 %
MCH: 31.2 pg (ref 26.0–34.0)
MCHC: 32.5 g/dL (ref 32.0–36.0)
MCV: 96 fL (ref 80.0–100.0)
MONO ABS: 0.6 10*3/uL (ref 0.2–1.0)
Monocytes Relative: 8 %
Neutro Abs: 5.9 10*3/uL (ref 1.4–6.5)
Neutrophils Relative %: 74 %
PLATELETS: 346 10*3/uL (ref 150–440)
RBC: 3.29 MIL/uL — ABNORMAL LOW (ref 4.40–5.90)
RDW: 14.3 % (ref 11.5–14.5)
WBC: 7.9 10*3/uL (ref 3.8–10.6)

## 2015-12-09 LAB — COMPREHENSIVE METABOLIC PANEL
ALT: 8 U/L — ABNORMAL LOW (ref 17–63)
ANION GAP: 7 (ref 5–15)
AST: 30 U/L (ref 15–41)
Albumin: 2.8 g/dL — ABNORMAL LOW (ref 3.5–5.0)
Alkaline Phosphatase: 122 U/L (ref 38–126)
BUN: 11 mg/dL (ref 6–20)
CHLORIDE: 100 mmol/L — AB (ref 101–111)
CO2: 32 mmol/L (ref 22–32)
CREATININE: 0.73 mg/dL (ref 0.61–1.24)
Calcium: 8.9 mg/dL (ref 8.9–10.3)
Glucose, Bld: 76 mg/dL (ref 65–99)
POTASSIUM: 3.6 mmol/L (ref 3.5–5.1)
SODIUM: 139 mmol/L (ref 135–145)
Total Bilirubin: 0.4 mg/dL (ref 0.3–1.2)
Total Protein: 7.1 g/dL (ref 6.5–8.1)

## 2015-12-09 MED ORDER — SODIUM CHLORIDE 0.9 % IV SOLN
Freq: Once | INTRAVENOUS | Status: AC
  Administered 2015-12-09: 15:00:00 via INTRAVENOUS
  Filled 2015-12-09: qty 1000

## 2015-12-09 MED ORDER — SODIUM CHLORIDE 0.9 % IV SOLN
200.0000 mg | Freq: Once | INTRAVENOUS | Status: AC
  Administered 2015-12-09: 200 mg via INTRAVENOUS
  Filled 2015-12-09: qty 8

## 2015-12-10 ENCOUNTER — Ambulatory Visit
Admission: RE | Admit: 2015-12-10 | Discharge: 2015-12-10 | Disposition: A | Discharge status: Home / Self Care | Payer: Medicare Other | Source: Ambulatory Visit | Attending: Radiation Oncology | Admitting: Radiation Oncology

## 2015-12-10 DIAGNOSIS — C7951 Secondary malignant neoplasm of bone: Secondary | ICD-10-CM | POA: Diagnosis not present

## 2015-12-10 LAB — THYROID PANEL WITH TSH
Free Thyroxine Index: 1.7 (ref 1.2–4.9)
T3 UPTAKE RATIO: 32 % (ref 24–39)
T4 TOTAL: 5.2 ug/dL (ref 4.5–12.0)
TSH: 2.13 u[IU]/mL (ref 0.450–4.500)

## 2015-12-11 DIAGNOSIS — C7951 Secondary malignant neoplasm of bone: Secondary | ICD-10-CM | POA: Diagnosis not present

## 2015-12-13 DIAGNOSIS — C7951 Secondary malignant neoplasm of bone: Secondary | ICD-10-CM | POA: Diagnosis not present

## 2015-12-18 ENCOUNTER — Ambulatory Visit
Admission: RE | Admit: 2015-12-18 | Discharge: 2015-12-18 | Disposition: A | Discharge status: Home / Self Care | Payer: Medicare Other | Source: Ambulatory Visit | Attending: Radiation Oncology | Admitting: Radiation Oncology

## 2015-12-18 DIAGNOSIS — C7951 Secondary malignant neoplasm of bone: Secondary | ICD-10-CM | POA: Diagnosis not present

## 2015-12-19 ENCOUNTER — Ambulatory Visit
Admission: RE | Admit: 2015-12-19 | Discharge: 2015-12-19 | Disposition: A | Discharge status: Home / Self Care | Payer: Medicare Other | Source: Ambulatory Visit | Attending: Radiation Oncology | Admitting: Radiation Oncology

## 2015-12-19 DIAGNOSIS — C7951 Secondary malignant neoplasm of bone: Secondary | ICD-10-CM | POA: Diagnosis not present

## 2015-12-20 ENCOUNTER — Ambulatory Visit
Admission: RE | Admit: 2015-12-20 | Discharge: 2015-12-20 | Disposition: A | Discharge status: Home / Self Care | Payer: Medicare Other | Source: Ambulatory Visit | Attending: Radiation Oncology | Admitting: Radiation Oncology

## 2015-12-20 DIAGNOSIS — C7951 Secondary malignant neoplasm of bone: Secondary | ICD-10-CM | POA: Diagnosis not present

## 2015-12-23 ENCOUNTER — Ambulatory Visit
Admission: RE | Admit: 2015-12-23 | Discharge: 2015-12-23 | Disposition: A | Discharge status: Home / Self Care | Payer: Medicare Other | Source: Ambulatory Visit | Attending: Radiation Oncology | Admitting: Radiation Oncology

## 2015-12-23 DIAGNOSIS — C7951 Secondary malignant neoplasm of bone: Secondary | ICD-10-CM | POA: Diagnosis not present

## 2015-12-24 ENCOUNTER — Ambulatory Visit
Admission: RE | Admit: 2015-12-24 | Discharge: 2015-12-24 | Disposition: A | Discharge status: Home / Self Care | Payer: Medicare Other | Source: Ambulatory Visit | Attending: Radiation Oncology | Admitting: Radiation Oncology

## 2015-12-24 DIAGNOSIS — C7951 Secondary malignant neoplasm of bone: Secondary | ICD-10-CM | POA: Diagnosis not present

## 2015-12-25 ENCOUNTER — Ambulatory Visit
Admission: RE | Admit: 2015-12-25 | Discharge: 2015-12-25 | Disposition: A | Discharge status: Home / Self Care | Payer: Medicare Other | Source: Ambulatory Visit | Attending: Radiation Oncology | Admitting: Radiation Oncology

## 2015-12-25 DIAGNOSIS — C7951 Secondary malignant neoplasm of bone: Secondary | ICD-10-CM | POA: Diagnosis not present

## 2015-12-30 ENCOUNTER — Inpatient Hospital Stay: Payer: Medicare Other

## 2015-12-30 ENCOUNTER — Ambulatory Visit
Admission: RE | Admit: 2015-12-30 | Discharge: 2015-12-30 | Disposition: A | Discharge status: Home / Self Care | Payer: Medicare Other | Source: Ambulatory Visit | Attending: Radiation Oncology | Admitting: Radiation Oncology

## 2015-12-30 ENCOUNTER — Inpatient Hospital Stay (HOSPITAL_BASED_OUTPATIENT_CLINIC_OR_DEPARTMENT_OTHER): Payer: Medicare Other | Admitting: Oncology

## 2015-12-30 VITALS — BP 80/49 | HR 77 | Temp 96.4°F | Resp 12 | Wt 175.0 lb

## 2015-12-30 DIAGNOSIS — N529 Male erectile dysfunction, unspecified: Secondary | ICD-10-CM

## 2015-12-30 DIAGNOSIS — E119 Type 2 diabetes mellitus without complications: Secondary | ICD-10-CM

## 2015-12-30 DIAGNOSIS — C7972 Secondary malignant neoplasm of left adrenal gland: Secondary | ICD-10-CM

## 2015-12-30 DIAGNOSIS — C7951 Secondary malignant neoplasm of bone: Secondary | ICD-10-CM | POA: Diagnosis not present

## 2015-12-30 DIAGNOSIS — R413 Other amnesia: Secondary | ICD-10-CM

## 2015-12-30 DIAGNOSIS — I959 Hypotension, unspecified: Secondary | ICD-10-CM

## 2015-12-30 DIAGNOSIS — N400 Enlarged prostate without lower urinary tract symptoms: Secondary | ICD-10-CM

## 2015-12-30 DIAGNOSIS — C3431 Malignant neoplasm of lower lobe, right bronchus or lung: Secondary | ICD-10-CM

## 2015-12-30 DIAGNOSIS — I2510 Atherosclerotic heart disease of native coronary artery without angina pectoris: Secondary | ICD-10-CM

## 2015-12-30 DIAGNOSIS — Z7984 Long term (current) use of oral hypoglycemic drugs: Secondary | ICD-10-CM

## 2015-12-30 DIAGNOSIS — Z8042 Family history of malignant neoplasm of prostate: Secondary | ICD-10-CM

## 2015-12-30 DIAGNOSIS — Z85828 Personal history of other malignant neoplasm of skin: Secondary | ICD-10-CM

## 2015-12-30 DIAGNOSIS — G20 Parkinson's disease: Secondary | ICD-10-CM

## 2015-12-30 DIAGNOSIS — Z79899 Other long term (current) drug therapy: Secondary | ICD-10-CM

## 2015-12-30 DIAGNOSIS — I252 Old myocardial infarction: Secondary | ICD-10-CM

## 2015-12-30 DIAGNOSIS — R350 Frequency of micturition: Secondary | ICD-10-CM

## 2015-12-30 DIAGNOSIS — Z87891 Personal history of nicotine dependence: Secondary | ICD-10-CM

## 2015-12-30 DIAGNOSIS — Z7982 Long term (current) use of aspirin: Secondary | ICD-10-CM

## 2015-12-30 DIAGNOSIS — Z86010 Personal history of colonic polyps: Secondary | ICD-10-CM

## 2015-12-30 DIAGNOSIS — D649 Anemia, unspecified: Secondary | ICD-10-CM | POA: Diagnosis not present

## 2015-12-30 DIAGNOSIS — R269 Unspecified abnormalities of gait and mobility: Secondary | ICD-10-CM

## 2015-12-30 DIAGNOSIS — I10 Essential (primary) hypertension: Secondary | ICD-10-CM

## 2015-12-30 DIAGNOSIS — R109 Unspecified abdominal pain: Secondary | ICD-10-CM

## 2015-12-30 DIAGNOSIS — E785 Hyperlipidemia, unspecified: Secondary | ICD-10-CM

## 2015-12-30 LAB — CBC WITH DIFFERENTIAL/PLATELET
BASOS PCT: 1 %
Basophils Absolute: 0 10*3/uL (ref 0–0.1)
EOS ABS: 0.2 10*3/uL (ref 0–0.7)
EOS PCT: 3 %
HCT: 27.4 % — ABNORMAL LOW (ref 40.0–52.0)
Hemoglobin: 8.9 g/dL — ABNORMAL LOW (ref 13.0–18.0)
LYMPHS ABS: 1.7 10*3/uL (ref 1.0–3.6)
Lymphocytes Relative: 25 %
MCH: 30.2 pg (ref 26.0–34.0)
MCHC: 32.6 g/dL (ref 32.0–36.0)
MCV: 92.4 fL (ref 80.0–100.0)
MONOS PCT: 10 %
Monocytes Absolute: 0.6 10*3/uL (ref 0.2–1.0)
Neutro Abs: 4.2 10*3/uL (ref 1.4–6.5)
Neutrophils Relative %: 61 %
PLATELETS: 243 10*3/uL (ref 150–440)
RBC: 2.96 MIL/uL — AB (ref 4.40–5.90)
RDW: 14.6 % — ABNORMAL HIGH (ref 11.5–14.5)
WBC: 6.7 10*3/uL (ref 3.8–10.6)

## 2015-12-30 LAB — COMPREHENSIVE METABOLIC PANEL
ALT: <5 U/L — ABNORMAL LOW (ref 17–63)
ANION GAP: 10 (ref 5–15)
AST: 26 U/L (ref 15–41)
Albumin: 3.1 g/dL — ABNORMAL LOW (ref 3.5–5.0)
Alkaline Phosphatase: 67 U/L (ref 38–126)
BUN: 9 mg/dL (ref 6–20)
CHLORIDE: 97 mmol/L — AB (ref 101–111)
CO2: 29 mmol/L (ref 22–32)
Calcium: 8.4 mg/dL — ABNORMAL LOW (ref 8.9–10.3)
Creatinine, Ser: 0.74 mg/dL (ref 0.61–1.24)
GFR calc non Af Amer: >60 mL/min (ref >60)
Glucose, Bld: 183 mg/dL — ABNORMAL HIGH (ref 65–99)
POTASSIUM: 3.4 mmol/L — AB (ref 3.5–5.1)
SODIUM: 136 mmol/L (ref 135–145)
Total Bilirubin: 0.5 mg/dL (ref 0.3–1.2)
Total Protein: 6.8 g/dL (ref 6.5–8.1)

## 2015-12-30 MED ORDER — SODIUM CHLORIDE 0.9 % IV SOLN
INTRAVENOUS | Status: DC
  Administered 2015-12-30: 15:00:00 via INTRAVENOUS
  Filled 2015-12-30 (×2): qty 1000

## 2015-12-30 NOTE — Progress Notes (Signed)
[place]  Telephone:([phone] [phone]  ID: Craig Dennis. 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   INTERVAL HISTORY: Patient returns to clinic today for further evaluation and consideration of cycle 2 of Keytruda. His performance status has declined and patient is more lethargic today. Craig Dennis does not complain of pain. Craig Dennis otherwise feels well and is asymptomatic. Craig Dennis has no neurologic complaints. Craig Dennis denies any recent fevers. Craig Dennis has a poor appetite. Craig Dennis denies any chest pain, shortness of breath, cough, or hemoptysis. Craig Dennis has no nausea, vomiting, constipation, or diarrhea. Craig Dennis has no urinary complaints. Patient offers no further specific complaints today.  REVIEW OF SYSTEMS:   Review of Systems  Constitutional: Negative.  Negative for fever, malaise/fatigue and weight loss.  Respiratory: Negative.  Negative for cough, hemoptysis and shortness of breath.   Cardiovascular: Negative.  Negative for chest pain and leg swelling.  Gastrointestinal: Negative.  Negative for abdominal pain.  Genitourinary: Positive for frequency. Negative for flank pain.  Neurological: Positive for tremors. Negative for sensory change, focal weakness and weakness.  Psychiatric/Behavioral: Positive for memory loss. The patient is not nervous/anxious.     As per HPI. Otherwise, a complete review of systems is negative.  PAST MEDICAL HISTORY: Past Medical History:  Diagnosis Date  . BPH (benign prostatic hyperplasia)   . CAD (coronary artery disease)   . Cancer ([place])    skin  . Colon polyps   . Diabetes ([place])   . Dyslipidemia   . Erectile dysfunction   . Gait disorder   . Heart murmur   . History of chest pain   . History of  headache    chronic, recurrent, postconcussive, after fall from a collapsed deck, had been treated wtih prn fiorinal  . HOH (hard of hearing)   . Hypertension   . Lung cancer ([place])   . Myocardial infarction   . Parkinson's disease    Primarily right sided features  . Parkinson's disease ([place])   . Plantar fasciitis     PAST SURGICAL HISTORY: Past Surgical History:  Procedure Laterality Date  . cardicac stent    . CATARACT EXTRACTION W/PHACO Right 06/28/2014   Procedure: CATARACT EXTRACTION PHACO AND INTRAOCULAR LENS PLACEMENT (IOC);  Surgeon: Lyla Glassing, MD;  Location: [facility] ORS;  Service: Ophthalmology;  Laterality: Right;  [place]    1:07.4               AP     12.6             CDE   8.50  . CORONARY ANGIOPLASTY     stent  . HEMORRHOID SURGERY    . TONSILLECTOMY AND ADENOIDECTOMY      FAMILY HISTORY: Family History  Problem Relation Age of Onset  . Heart disease Mother   . Heart attack Mother   . Diabetes Mother   . Tremor Mother     Benign essential tremor, but not parkinson's disease  . Prostate cancer Father   . Heart disease Father     ADVANCED DIRECTIVES (Y/N):  N  HEALTH MAINTENANCE: Social History  Substance Use Topics  . Smoking status: Former Smoker    Quit date: 02/03/1955  . Smokeless tobacco: Never Used  . Alcohol use No  Colonoscopy:  PAP:  Bone density:  Lipid panel:  Allergies  Allergen Reactions  . Tramadol Other (See Comments)    Current Outpatient Prescriptions  Medication Sig Dispense Refill  . ALPRAZolam (XANAX) 0.25 MG tablet Take 0.25 mg by mouth at bedtime as needed for anxiety.    Marland Kitchen amantadine (SYMMETREL) 100 MG capsule Take 100 mg by mouth 2 (two) times daily.    Marland Kitchen aspirin EC 81 MG tablet Take 81 mg by mouth daily.    . butalbital-aspirin-caffeine (FIORINAL) 50-325-40 MG capsule Take 1 capsule by mouth 4 (four) times daily as needed for migraine.    . carbidopa-levodopa (SINEMET CR) 50-200 MG per tablet Take 7 tablets by mouth  daily. Pt takes 2 tablet at 7am, 2 tablets at 12 pm, and one tablet at 2pm.    . donepezil (ARICEPT) 5 MG tablet Take 5 mg by mouth daily.    . fludrocortisone (FLORINEF) 0.1 MG tablet Take 0.1 mg by mouth daily.     . metFORMIN (GLUCOPHAGE) 1000 MG tablet Take 1,000 mg by mouth 2 (two) times daily.     . ondansetron (ZOFRAN) 8 MG tablet Take 1 tablet (8 mg total) by mouth 2 (two) times daily as needed (Nausea or vomiting). 30 tablet 1  . prochlorperazine (COMPAZINE) 10 MG tablet Take 1 tablet (10 mg total) by mouth every 6 (six) hours as needed (Nausea or vomiting). 30 tablet 1  . rasagiline (AZILECT) 1 MG TABS tablet Take 1 mg by mouth at bedtime.     Marland Kitchen rOPINIRole (REQUIP) 4 MG tablet Take 14 mg by mouth at bedtime. Pt takes with two 38m tablets.    . simvastatin (ZOCOR) 40 MG tablet Take 40 mg by mouth at bedtime.    . tamsulosin (FLOMAX) 0.4 MG CAPS capsule Take 0.8 mg by mouth daily at 12 noon. At 12 noon.    . triamcinolone cream (KENALOG) 0.1 % APPLY TWICE DAILY TO AFFECTED AREAS ON LEGS AS NEEDED  0  . glimepiride (AMARYL) 2 MG tablet Take 1 mg by mouth 2 (two) times daily.     .Marland Kitchenlinagliptin (TRADJENTA) 5 MG TABS tablet Take 5 mg by mouth daily.     Current Facility-Administered Medications  Medication Dose Route Frequency Provider Last Rate Last Dose  . 0.9 %  sodium chloride infusion   Intravenous Continuous TLloyd Huger MD   Stopped at 12/30/15 1700    OBJECTIVE: Vitals:   12/30/15 1424  BP: (!) 80/49  Pulse: 77  Resp: 12  Temp: (!) 96.4 F (35.8 C)     Body mass index is 23.09 kg/m.    ECOG FS:3 - Symptomatic, >50% confined to bed  General: No acute distress. Sitting in a wheelchair.  Eyes: Pink conjunctiva, anicteric sclera. Lungs: Clear to auscultation bilaterally. Heart: Regular rate and rhythm. No rubs, murmurs, or gallops. Abdomen: Soft, nontender, nondistended. No organomegaly noted, normoactive bowel sounds. Musculoskeletal: No edema, cyanosis, or  clubbing. Neuro: Lethargic. Cranial nerves grossly intact. Skin: No rashes or petechiae noted. Psych: Normal affect.   LAB RESULTS:  Lab Results  Component Value Date   NA 136 12/30/2015   K 3.4 (L) 12/30/2015   CL 97 (L) 12/30/2015   CO2 29 12/30/2015   GLUCOSE 183 (H) 12/30/2015   BUN 9 12/30/2015   CREATININE 0.74 12/30/2015   CALCIUM 8.4 (L) 12/30/2015   PROT 6.8 12/30/2015   ALBUMIN 3.1 (L) 12/30/2015   AST 26 12/30/2015   ALT <5 (L) 12/30/2015  ALKPHOS 67 12/30/2015   BILITOT 0.5 12/30/2015   GFRNONAA >60 12/30/2015   GFRAA >60 12/30/2015    Lab Results  Component Value Date   WBC 6.7 12/30/2015   NEUTROABS 4.2 12/30/2015   HGB 8.9 (L) 12/30/2015   HCT 27.4 (L) 12/30/2015   MCV 92.4 12/30/2015   PLT 243 12/30/2015     STUDIES: No results found.  ASSESSMENT: Stage IV adenocarcinoma of the right lower lobe lung with adrenal and bony metastasis.  PLAN:    1. 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 The differential diagnosis based on the pattern of immunoreactivity includes metastatic gastrointestinal adenocarcinoma and pulmonary adenocarcinoma with intestinal / enteric differentiation. MMR did not reveal any loss of nuclear expression, therefore there is a low probability of MSI-H. PET scan results reviewed independently and reported as above with adrenal metastasis, retroperitoneal disease, as well as PET positive bone lesions. Patient still wishes to pursue aggressive treatment. Given patient's declining performance status and hypotension, we will delay cycle 2 of Keytruda. Return to clinic on January 06, 2016 for reconsideration of cycle 2. Plan to reimage after cycle 4.  2. Urinary frequency: UA and culture are negative. Monitor. 3. Anemia: Mild, monitor. 4. Hypotension: Patient was given 1 L of IV fluids over  2 hours today.  Patient expressed understanding and was in agreement with this plan. Craig Dennis also understands that Craig Dennis can call clinic at any time with any questions, concerns, or complaints.   Primary cancer of right lower lobe of lung ([place])   Staging form: Lung, AJCC 7th Edition   - Clinical stage from 12/01/2015: Stage IV (T2b, N2, M1b) - Signed by Lloyd Huger, MD on 12/01/2015  Lloyd Huger, MD   01/03/2016 8:36 AM

## 2015-12-30 NOTE — Progress Notes (Signed)
Patient's bp is 80/49 on check in office today and he is very lethargic during assesment.  Appetite has decreased and has only eaten 1 bowel of oatmeal today.

## 2015-12-31 ENCOUNTER — Ambulatory Visit
Admission: RE | Admit: 2015-12-31 | Discharge: 2015-12-31 | Disposition: A | Discharge status: Home / Self Care | Payer: Medicare Other | Source: Ambulatory Visit | Attending: Radiation Oncology | Admitting: Radiation Oncology

## 2015-12-31 DIAGNOSIS — C7951 Secondary malignant neoplasm of bone: Secondary | ICD-10-CM | POA: Diagnosis not present

## 2015-12-31 LAB — THYROID PANEL WITH TSH
Free Thyroxine Index: 1.5 (ref 1.2–4.9)
T3 UPTAKE RATIO: 30 % (ref 24–39)
T4 TOTAL: 5 ug/dL (ref 4.5–12.0)
TSH: 1.45 u[IU]/mL (ref 0.450–4.500)

## 2016-01-01 ENCOUNTER — Ambulatory Visit
Admission: RE | Admit: 2016-01-01 | Discharge: 2016-01-01 | Disposition: A | Discharge status: Home / Self Care | Payer: Medicare Other | Source: Ambulatory Visit | Attending: Radiation Oncology | Admitting: Radiation Oncology

## 2016-01-01 ENCOUNTER — Encounter: Payer: Self-pay | Admitting: *Deleted

## 2016-01-01 DIAGNOSIS — C7951 Secondary malignant neoplasm of bone: Secondary | ICD-10-CM | POA: Diagnosis not present

## 2016-01-02 ENCOUNTER — Ambulatory Visit: Payer: Medicare Other

## 2016-01-02 ENCOUNTER — Inpatient Hospital Stay: Payer: Medicare Other

## 2016-01-02 ENCOUNTER — Ambulatory Visit
Admission: RE | Admit: 2016-01-02 | Discharge: 2016-01-02 | Disposition: A | Discharge status: Home / Self Care | Payer: Medicare Other | Source: Ambulatory Visit | Attending: Radiation Oncology | Admitting: Radiation Oncology

## 2016-01-02 VITALS — BP 106/68 | HR 78 | Resp 18

## 2016-01-02 DIAGNOSIS — C3431 Malignant neoplasm of lower lobe, right bronchus or lung: Secondary | ICD-10-CM | POA: Diagnosis not present

## 2016-01-02 DIAGNOSIS — C7951 Secondary malignant neoplasm of bone: Secondary | ICD-10-CM | POA: Diagnosis not present

## 2016-01-02 DIAGNOSIS — C801 Malignant (primary) neoplasm, unspecified: Secondary | ICD-10-CM

## 2016-01-02 MED ORDER — SODIUM CHLORIDE 0.9 % IV SOLN
Freq: Once | INTRAVENOUS | Status: AC
  Administered 2016-01-02: 11:00:00 via INTRAVENOUS
  Filled 2016-01-02: qty 1000

## 2016-01-03 ENCOUNTER — Ambulatory Visit
Admission: RE | Admit: 2016-01-03 | Discharge: 2016-01-03 | Disposition: A | Discharge status: Home / Self Care | Payer: Medicare Other | Source: Ambulatory Visit | Attending: Radiation Oncology | Admitting: Radiation Oncology

## 2016-01-03 DIAGNOSIS — C7951 Secondary malignant neoplasm of bone: Secondary | ICD-10-CM | POA: Diagnosis not present

## 2016-01-06 ENCOUNTER — Encounter: Payer: Self-pay | Admitting: Oncology

## 2016-01-06 ENCOUNTER — Inpatient Hospital Stay: Payer: Medicare Other

## 2016-01-06 ENCOUNTER — Inpatient Hospital Stay (HOSPITAL_BASED_OUTPATIENT_CLINIC_OR_DEPARTMENT_OTHER): Payer: Medicare Other | Admitting: Oncology

## 2016-01-06 ENCOUNTER — Inpatient Hospital Stay: Payer: Medicare Other | Attending: Oncology

## 2016-01-06 VITALS — BP 88/56 | HR 67 | Temp 97.5°F | Ht 73.0 in

## 2016-01-06 VITALS — BP 107/62 | HR 65

## 2016-01-06 DIAGNOSIS — N400 Enlarged prostate without lower urinary tract symptoms: Secondary | ICD-10-CM | POA: Insufficient documentation

## 2016-01-06 DIAGNOSIS — E785 Hyperlipidemia, unspecified: Secondary | ICD-10-CM

## 2016-01-06 DIAGNOSIS — E119 Type 2 diabetes mellitus without complications: Secondary | ICD-10-CM

## 2016-01-06 DIAGNOSIS — D649 Anemia, unspecified: Secondary | ICD-10-CM

## 2016-01-06 DIAGNOSIS — Z86010 Personal history of colonic polyps: Secondary | ICD-10-CM | POA: Diagnosis not present

## 2016-01-06 DIAGNOSIS — C7970 Secondary malignant neoplasm of unspecified adrenal gland: Secondary | ICD-10-CM

## 2016-01-06 DIAGNOSIS — Z8042 Family history of malignant neoplasm of prostate: Secondary | ICD-10-CM | POA: Insufficient documentation

## 2016-01-06 DIAGNOSIS — I959 Hypotension, unspecified: Secondary | ICD-10-CM | POA: Insufficient documentation

## 2016-01-06 DIAGNOSIS — G20 Parkinson's disease: Secondary | ICD-10-CM | POA: Diagnosis not present

## 2016-01-06 DIAGNOSIS — I10 Essential (primary) hypertension: Secondary | ICD-10-CM | POA: Insufficient documentation

## 2016-01-06 DIAGNOSIS — N529 Male erectile dysfunction, unspecified: Secondary | ICD-10-CM | POA: Diagnosis not present

## 2016-01-06 DIAGNOSIS — I2510 Atherosclerotic heart disease of native coronary artery without angina pectoris: Secondary | ICD-10-CM

## 2016-01-06 DIAGNOSIS — Z5112 Encounter for antineoplastic immunotherapy: Secondary | ICD-10-CM | POA: Diagnosis not present

## 2016-01-06 DIAGNOSIS — C3431 Malignant neoplasm of lower lobe, right bronchus or lung: Secondary | ICD-10-CM

## 2016-01-06 DIAGNOSIS — Z85828 Personal history of other malignant neoplasm of skin: Secondary | ICD-10-CM

## 2016-01-06 DIAGNOSIS — R630 Anorexia: Secondary | ICD-10-CM | POA: Insufficient documentation

## 2016-01-06 DIAGNOSIS — I252 Old myocardial infarction: Secondary | ICD-10-CM | POA: Diagnosis not present

## 2016-01-06 DIAGNOSIS — M722 Plantar fascial fibromatosis: Secondary | ICD-10-CM | POA: Insufficient documentation

## 2016-01-06 DIAGNOSIS — R011 Cardiac murmur, unspecified: Secondary | ICD-10-CM

## 2016-01-06 DIAGNOSIS — Z7982 Long term (current) use of aspirin: Secondary | ICD-10-CM

## 2016-01-06 DIAGNOSIS — Z7984 Long term (current) use of oral hypoglycemic drugs: Secondary | ICD-10-CM

## 2016-01-06 DIAGNOSIS — R269 Unspecified abnormalities of gait and mobility: Secondary | ICD-10-CM

## 2016-01-06 DIAGNOSIS — Z87891 Personal history of nicotine dependence: Secondary | ICD-10-CM | POA: Diagnosis not present

## 2016-01-06 DIAGNOSIS — C7951 Secondary malignant neoplasm of bone: Secondary | ICD-10-CM | POA: Diagnosis not present

## 2016-01-06 DIAGNOSIS — Z79899 Other long term (current) drug therapy: Secondary | ICD-10-CM | POA: Insufficient documentation

## 2016-01-06 LAB — CBC WITH DIFFERENTIAL/PLATELET
BASOS ABS: 0 10*3/uL (ref 0–0.1)
Basophils Relative: 1 %
EOS ABS: 0.1 10*3/uL (ref 0–0.7)
EOS PCT: 2 %
HCT: 28.3 % — ABNORMAL LOW (ref 40.0–52.0)
Hemoglobin: 9.3 g/dL — ABNORMAL LOW (ref 13.0–18.0)
LYMPHS ABS: 1.5 10*3/uL (ref 1.0–3.6)
LYMPHS PCT: 25 %
MCH: 30.2 pg (ref 26.0–34.0)
MCHC: 32.8 g/dL (ref 32.0–36.0)
MCV: 92.1 fL (ref 80.0–100.0)
MONO ABS: 0.6 10*3/uL (ref 0.2–1.0)
Monocytes Relative: 10 %
Neutro Abs: 3.8 10*3/uL (ref 1.4–6.5)
Neutrophils Relative %: 62 %
PLATELETS: 234 10*3/uL (ref 150–440)
RBC: 3.07 MIL/uL — ABNORMAL LOW (ref 4.40–5.90)
RDW: 15.1 % — AB (ref 11.5–14.5)
WBC: 6.1 10*3/uL (ref 3.8–10.6)

## 2016-01-06 LAB — COMPREHENSIVE METABOLIC PANEL
ALT: <5 U/L — ABNORMAL LOW (ref 17–63)
AST: 27 U/L (ref 15–41)
Albumin: 3 g/dL — ABNORMAL LOW (ref 3.5–5.0)
Alkaline Phosphatase: 68 U/L (ref 38–126)
Anion gap: 7 (ref 5–15)
BUN: 7 mg/dL (ref 6–20)
CHLORIDE: 98 mmol/L — AB (ref 101–111)
CO2: 32 mmol/L (ref 22–32)
Calcium: 8.4 mg/dL — ABNORMAL LOW (ref 8.9–10.3)
Creatinine, Ser: 0.67 mg/dL (ref 0.61–1.24)
Glucose, Bld: 158 mg/dL — ABNORMAL HIGH (ref 65–99)
POTASSIUM: 3.3 mmol/L — AB (ref 3.5–5.1)
SODIUM: 137 mmol/L (ref 135–145)
Total Bilirubin: 0.5 mg/dL (ref 0.3–1.2)
Total Protein: 7 g/dL (ref 6.5–8.1)

## 2016-01-06 MED ORDER — SODIUM CHLORIDE 0.9 % IV SOLN
200.0000 mg | Freq: Once | INTRAVENOUS | Status: AC
  Administered 2016-01-06: 200 mg via INTRAVENOUS
  Filled 2016-01-06: qty 8

## 2016-01-06 MED ORDER — SODIUM CHLORIDE 0.9 % IV SOLN
Freq: Once | INTRAVENOUS | Status: AC
  Administered 2016-01-06: 15:00:00 via INTRAVENOUS
  Filled 2016-01-06: qty 1000

## 2016-01-06 MED ORDER — MEGESTROL ACETATE 40 MG PO TABS: 40.0000 mg | ORAL_TABLET | Freq: Every day | ORAL | 1 refills | Status: AC

## 2016-01-06 MED ORDER — SODIUM CHLORIDE 0.9 % IV SOLN
INTRAVENOUS | Status: DC
  Administered 2016-01-06: 15:00:00 via INTRAVENOUS
  Filled 2016-01-06 (×3): qty 1000

## 2016-01-06 NOTE — Progress Notes (Signed)
Patient here pre treatment check. Wife reports poor appetite. Sleeping normal..

## 2016-01-06 NOTE — Progress Notes (Signed)
[facility]  Telephone:([phone] [phone]  ID: Craig Dennis. 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   INTERVAL HISTORY: Patient returns to clinic today for further evaluation and reconsideration of cycle 2 of Keytruda. His performance status has mildly improved and he is more alert today. He does not complain of pain. He otherwise feels well and is asymptomatic. He has no neurologic complaints. He denies any recent fevers. He has a poor appetite. He denies any chest pain, shortness of breath, cough, or hemoptysis. He has no nausea, vomiting, constipation, or diarrhea. He has no urinary complaints. Patient offers no further specific complaints today.  REVIEW OF SYSTEMS:   Review of Systems  Constitutional: Negative.  Negative for fever, malaise/fatigue and weight loss.  Respiratory: Negative.  Negative for cough, hemoptysis and shortness of breath.   Cardiovascular: Negative.  Negative for chest pain and leg swelling.  Gastrointestinal: Negative.  Negative for abdominal pain.  Genitourinary: Negative for flank pain and frequency.  Neurological: Positive for tremors. Negative for sensory change, focal weakness and weakness.  Psychiatric/Behavioral: Positive for memory loss. The patient is not nervous/anxious.     As per HPI. Otherwise, a complete review of systems is negative.  PAST MEDICAL HISTORY: Past Medical History:  Diagnosis Date  . BPH (benign prostatic hyperplasia)   . CAD (coronary artery disease)   . Cancer ([facility])    skin  . Colon polyps   . Diabetes ([facility])   . Dyslipidemia   . Erectile dysfunction   . Gait disorder   . Heart murmur   . History of chest pain   . History of headache      chronic, recurrent, postconcussive, after fall from a collapsed deck, had been treated wtih prn fiorinal  . HOH (hard of hearing)   . Hypertension   . Lung cancer ([facility])   . Myocardial infarction   . Parkinson's disease    Primarily right sided features  . Parkinson's disease ([facility])   . Plantar fasciitis     PAST SURGICAL HISTORY: Past Surgical History:  Procedure Laterality Date  . cardicac stent    . CATARACT EXTRACTION W/PHACO Right 06/28/2014   Procedure: CATARACT EXTRACTION PHACO AND INTRAOCULAR LENS PLACEMENT (IOC);  Surgeon: Lia Hopping, MD;  Location: [facility] ORS;  Service: Ophthalmology;  Laterality: Right;  [place]    1:07.4               AP     12.6             CDE   8.50  . CORONARY ANGIOPLASTY     stent  . HEMORRHOID SURGERY    . TONSILLECTOMY AND ADENOIDECTOMY      FAMILY HISTORY: Family History  Problem Relation Age of Onset  . Heart disease Mother   . Heart attack Mother   . Diabetes Mother   . Tremor Mother     Benign essential tremor, but not parkinson's disease  . Prostate cancer Father   . Heart disease Father     ADVANCED DIRECTIVES (Y/N):  N  HEALTH MAINTENANCE: Social History  Substance Use Topics  . Smoking status: Former Smoker    Quit date: 02/03/1955  . Smokeless tobacco: Never Used  . Alcohol use No  Colonoscopy:  PAP:  Bone density:  Lipid panel:  Allergies  Allergen Reactions  . Tramadol Other (See Comments)    Current Outpatient Prescriptions  Medication Sig Dispense Refill  . ALPRAZolam (XANAX) 0.25 MG tablet Take 0.25 mg by mouth at bedtime as needed for anxiety.    Marland Kitchen amantadine (SYMMETREL) 100 MG capsule Take 100 mg by mouth 2 (two) times daily.    Marland Kitchen aspirin EC 81 MG tablet Take 81 mg by mouth daily.    . butalbital-aspirin-caffeine (FIORINAL) 50-325-40 MG capsule Take 1 capsule by mouth 4 (four) times daily as needed for migraine.    . carbidopa-levodopa (SINEMET CR) 50-200 MG per tablet Take 7 tablets by mouth daily. Pt  takes 2 tablet at 7am, 2 tablets at 12 pm, and one tablet at 2pm.    . donepezil (ARICEPT) 5 MG tablet Take 5 mg by mouth daily.    . fludrocortisone (FLORINEF) 0.1 MG tablet Take 0.1 mg by mouth daily.     Marland Kitchen glimepiride (AMARYL) 2 MG tablet Take 1 mg by mouth 2 (two) times daily.     Marland Kitchen linagliptin (TRADJENTA) 5 MG TABS tablet Take 5 mg by mouth daily.    . metFORMIN (GLUCOPHAGE) 1000 MG tablet Take 1,000 mg by mouth 2 (two) times daily.     . ondansetron (ZOFRAN) 8 MG tablet Take 1 tablet (8 mg total) by mouth 2 (two) times daily as needed (Nausea or vomiting). 30 tablet 1  . rasagiline (AZILECT) 1 MG TABS tablet Take 1 mg by mouth at bedtime.     Marland Kitchen rOPINIRole (REQUIP) 4 MG tablet Take 14 mg by mouth at bedtime. Pt takes with two 1m tablets.    . simvastatin (ZOCOR) 40 MG tablet Take 40 mg by mouth at bedtime.    . tamsulosin (FLOMAX) 0.4 MG CAPS capsule Take 0.8 mg by mouth daily at 12 noon. At 12 noon.    . triamcinolone cream (KENALOG) 0.1 % APPLY TWICE DAILY TO AFFECTED AREAS ON LEGS AS NEEDED  0  . megestrol (MEGACE) 40 MG tablet Take 1 tablet (40 mg total) by mouth daily. 30 tablet 1  . prochlorperazine (COMPAZINE) 10 MG tablet Take 1 tablet (10 mg total) by mouth every 6 (six) hours as needed (Nausea or vomiting). (Patient not taking: Reported on 01/06/2016) 30 tablet 1   Current Facility-Administered Medications  Medication Dose Route Frequency Provider Last Rate Last Dose  . 0.9 %  sodium chloride infusion   Intravenous Continuous TLloyd Huger MD   Stopped at 01/06/16 1610    OBJECTIVE: Vitals:   01/06/16 1406  BP: (!) 88/56  Pulse: 67  Temp: 97.5 F (36.4 C)     There is no height or weight on file to calculate BMI.    ECOG FS:3 - Symptomatic, >50% confined to bed  General: No acute distress. Sitting in a wheelchair.  Eyes: Pink conjunctiva, anicteric sclera. Lungs: Clear to auscultation bilaterally. Heart: Regular rate and rhythm. No rubs, murmurs, or  gallops. Abdomen: Soft, nontender, nondistended. No organomegaly noted, normoactive bowel sounds. Musculoskeletal: No edema, cyanosis, or clubbing. Neuro: Lethargic. Cranial nerves grossly intact. Skin: No rashes or petechiae noted. Psych: Normal affect.   LAB RESULTS:  Lab Results  Component Value Date   NA 137 01/06/2016   K 3.3 (L) 01/06/2016   CL 98 (L) 01/06/2016   CO2 32 01/06/2016   GLUCOSE 158 (H) 01/06/2016   BUN 7 01/06/2016   CREATININE 0.67 01/06/2016   CALCIUM  8.4 (L) 01/06/2016   PROT 7.0 01/06/2016   ALBUMIN 3.0 (L) 01/06/2016   AST 27 01/06/2016   ALT <5 (L) 01/06/2016   ALKPHOS 68 01/06/2016   BILITOT 0.5 01/06/2016   GFRNONAA >60 01/06/2016   GFRAA >60 01/06/2016    Lab Results  Component Value Date   WBC 6.1 01/06/2016   NEUTROABS 3.8 01/06/2016   HGB 9.3 (L) 01/06/2016   HCT 28.3 (L) 01/06/2016   MCV 92.1 01/06/2016   PLT 234 01/06/2016     STUDIES: No results found.  ASSESSMENT: Stage IV adenocarcinoma of the right lower lobe lung with adrenal and bony metastasis.  PLAN:    1. 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 The differential diagnosis based on the pattern of immunoreactivity includes metastatic gastrointestinal adenocarcinoma and pulmonary adenocarcinoma with intestinal / enteric differentiation. MMR did not reveal any loss of nuclear expression, therefore there is a low probability of MSI-H. PET scan results reviewed independently and reported as above with adrenal metastasis, retroperitoneal disease, as well as PET positive bone lesions. Patient still wishes to pursue aggressive treatment. We will proceed with cycle 2 of Keytruda today, but given patient's multiple comorbidities and declining performance status will have to reconsider additional treatments. Patient will return to clinic  in 3 weeks for consideration of cycle 3 and further discussion of whether or not to continue treatment. 2. Urinary frequency: UA and culture are negative. Monitor. 3. Anemia: Mild, monitor. 4. Hypotension: Patient was given 1 L of IV fluids over 2 hours today.  Patient expressed understanding and was in agreement with this plan. He also understands that He can call clinic at any time with any questions, concerns, or complaints.   Primary cancer of right lower lobe of lung [facility])   Staging form: Lung, AJCC 7th Edition   - Clinical stage from 12/01/2015: Stage IV (T2b, N2, M1b) - Signed by Lloyd Huger, MD on 12/01/2015  Lloyd Huger, MD   01/08/2016 9:36 AM

## 2016-01-06 NOTE — Progress Notes (Signed)
1510: Concern regarding patient's low BP, spoke with Angie Fava, RN and OK, per MD, to go ahead with [facility] today.  LJ  1528:  Myra, Pharmacist, spoke with Dr. Grayland Ormond and OK to go ahead with treatment today. LJ  1602: BP improving, see flowsheet. LJ

## 2016-01-07 LAB — THYROID PANEL WITH TSH
Free Thyroxine Index: 1.8 (ref 1.2–4.9)
T3 Uptake Ratio: 31 % (ref 24–39)
T4, Total: 5.8 ug/dL (ref 4.5–12.0)
TSH: 2.07 u[IU]/mL (ref 0.450–4.500)

## 2016-01-28 ENCOUNTER — Inpatient Hospital Stay: Payer: Medicare Other

## 2016-01-28 ENCOUNTER — Inpatient Hospital Stay (HOSPITAL_BASED_OUTPATIENT_CLINIC_OR_DEPARTMENT_OTHER): Payer: Medicare Other | Admitting: Oncology

## 2016-01-28 VITALS — BP 93/57 | HR 84 | Temp 98.4°F | Resp 18 | Wt 178.1 lb

## 2016-01-28 DIAGNOSIS — D649 Anemia, unspecified: Secondary | ICD-10-CM | POA: Diagnosis not present

## 2016-01-28 DIAGNOSIS — I959 Hypotension, unspecified: Secondary | ICD-10-CM

## 2016-01-28 DIAGNOSIS — Z8042 Family history of malignant neoplasm of prostate: Secondary | ICD-10-CM

## 2016-01-28 DIAGNOSIS — R269 Unspecified abnormalities of gait and mobility: Secondary | ICD-10-CM

## 2016-01-28 DIAGNOSIS — Z79899 Other long term (current) drug therapy: Secondary | ICD-10-CM

## 2016-01-28 DIAGNOSIS — N529 Male erectile dysfunction, unspecified: Secondary | ICD-10-CM

## 2016-01-28 DIAGNOSIS — G20 Parkinson's disease: Secondary | ICD-10-CM

## 2016-01-28 DIAGNOSIS — C3431 Malignant neoplasm of lower lobe, right bronchus or lung: Secondary | ICD-10-CM | POA: Diagnosis not present

## 2016-01-28 DIAGNOSIS — I2510 Atherosclerotic heart disease of native coronary artery without angina pectoris: Secondary | ICD-10-CM

## 2016-01-28 DIAGNOSIS — C7970 Secondary malignant neoplasm of unspecified adrenal gland: Secondary | ICD-10-CM

## 2016-01-28 DIAGNOSIS — I252 Old myocardial infarction: Secondary | ICD-10-CM

## 2016-01-28 DIAGNOSIS — C7951 Secondary malignant neoplasm of bone: Secondary | ICD-10-CM

## 2016-01-28 DIAGNOSIS — E119 Type 2 diabetes mellitus without complications: Secondary | ICD-10-CM

## 2016-01-28 DIAGNOSIS — R011 Cardiac murmur, unspecified: Secondary | ICD-10-CM

## 2016-01-28 DIAGNOSIS — Z86010 Personal history of colonic polyps: Secondary | ICD-10-CM

## 2016-01-28 DIAGNOSIS — Z87891 Personal history of nicotine dependence: Secondary | ICD-10-CM

## 2016-01-28 DIAGNOSIS — N400 Enlarged prostate without lower urinary tract symptoms: Secondary | ICD-10-CM

## 2016-01-28 DIAGNOSIS — R630 Anorexia: Secondary | ICD-10-CM

## 2016-01-28 DIAGNOSIS — E785 Hyperlipidemia, unspecified: Secondary | ICD-10-CM

## 2016-01-28 DIAGNOSIS — Z85828 Personal history of other malignant neoplasm of skin: Secondary | ICD-10-CM

## 2016-01-28 DIAGNOSIS — M722 Plantar fascial fibromatosis: Secondary | ICD-10-CM

## 2016-01-28 DIAGNOSIS — Z7982 Long term (current) use of aspirin: Secondary | ICD-10-CM

## 2016-01-28 DIAGNOSIS — I10 Essential (primary) hypertension: Secondary | ICD-10-CM

## 2016-01-28 DIAGNOSIS — Z7984 Long term (current) use of oral hypoglycemic drugs: Secondary | ICD-10-CM

## 2016-01-28 LAB — COMPREHENSIVE METABOLIC PANEL
ALBUMIN: 2.9 g/dL — AB (ref 3.5–5.0)
ALT: <5 U/L — ABNORMAL LOW (ref 17–63)
AST: 23 U/L (ref 15–41)
Alkaline Phosphatase: 68 U/L (ref 38–126)
Anion gap: 10 (ref 5–15)
BILIRUBIN TOTAL: 0.5 mg/dL (ref 0.3–1.2)
BUN: 7 mg/dL (ref 6–20)
CO2: 26 mmol/L (ref 22–32)
Calcium: 8.4 mg/dL — ABNORMAL LOW (ref 8.9–10.3)
Chloride: 100 mmol/L — ABNORMAL LOW (ref 101–111)
Creatinine, Ser: 0.66 mg/dL (ref 0.61–1.24)
GFR calc Af Amer: >60 mL/min (ref >60)
GFR calc non Af Amer: >60 mL/min (ref >60)
GLUCOSE: 183 mg/dL — AB (ref 65–99)
POTASSIUM: 2.7 mmol/L — AB (ref 3.5–5.1)
SODIUM: 136 mmol/L (ref 135–145)
TOTAL PROTEIN: 7 g/dL (ref 6.5–8.1)

## 2016-01-28 LAB — CBC WITH DIFFERENTIAL/PLATELET
BASOS ABS: 0 10*3/uL (ref 0–0.1)
BASOS PCT: 1 %
EOS ABS: 0.2 10*3/uL (ref 0–0.7)
Eosinophils Relative: 3 %
HEMATOCRIT: 31.3 % — AB (ref 40.0–52.0)
HEMOGLOBIN: 10.4 g/dL — AB (ref 13.0–18.0)
Lymphocytes Relative: 31 %
Lymphs Abs: 2.1 10*3/uL (ref 1.0–3.6)
MCH: 30.7 pg (ref 26.0–34.0)
MCHC: 33.2 g/dL (ref 32.0–36.0)
MCV: 92.4 fL (ref 80.0–100.0)
MONO ABS: 0.6 10*3/uL (ref 0.2–1.0)
Monocytes Relative: 9 %
NEUTROS ABS: 3.9 10*3/uL (ref 1.4–6.5)
NEUTROS PCT: 56 %
Platelets: 227 10*3/uL (ref 150–440)
RBC: 3.38 MIL/uL — ABNORMAL LOW (ref 4.40–5.90)
RDW: 15.9 % — AB (ref 11.5–14.5)
WBC: 6.9 10*3/uL (ref 3.8–10.6)

## 2016-01-28 MED ORDER — POTASSIUM CHLORIDE CRYS ER 20 MEQ PO TBCR: 40.0000 meq | EXTENDED_RELEASE_TABLET | Freq: Two times a day (BID) | ORAL | 1 refills | Status: AC

## 2016-01-28 MED ORDER — SODIUM CHLORIDE 0.9 % IV SOLN
Freq: Once | INTRAVENOUS | Status: AC
  Administered 2016-01-28: 15:00:00 via INTRAVENOUS
  Filled 2016-01-28: qty 1000

## 2016-01-28 MED ORDER — SODIUM CHLORIDE 0.9 % IV SOLN
200.0000 mg | Freq: Once | INTRAVENOUS | Status: AC
  Administered 2016-01-28: 200 mg via INTRAVENOUS
  Filled 2016-01-28: qty 8

## 2016-01-28 NOTE — Progress Notes (Signed)
Patient had an episode of unresponsiveness once getting into the exam room. He took a drink of water and his eyes rolled back and  he became rigid.  Was out about 30 seconds and seemingly recovered. In another few seconds he repeated the episode.  Son states he has been doing this.  First noticed it when patient was eating.  Now is seeing it more often and not just when patient is eating.   Patient fell over Christmas.  He got out of the house and fell in the road  The neighbor was backing out of his driveway and saw him lying in the road.  Patient has a scrape on his left forehead where he fell.  Also Lonnie in clinical lab called with K+ result of 2.7.  MD notified.

## 2016-01-28 NOTE — Progress Notes (Signed)
Potassium: 2.7. MD, Dr. Grayland Ormond, notified and already aware. Per MD: Proceed with scheduled treatment today. No new orders.

## 2016-01-28 NOTE — Progress Notes (Signed)
[place]  Telephone:([phone] [phone]  ID: Craig Dennis. 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   INTERVAL HISTORY: Patient returns to clinic today for further evaluation and consideration of cycle 3 of Keytruda. His performance status has mildly improved and he is more alert today. Earlier this week he was confused and wondered outside of his house and to the street. He was found by the neighbor, but no major injuries were noted. He had an episode of was thought to be a seizure in clinic today, but patient recovered within 10-15 seconds without incident. He otherwise feels well and is asymptomatic. He has no neurologic complaints. He denies any recent fevers. He has a poor appetite. He denies any chest pain, shortness of breath, cough, or hemoptysis. He has no nausea, vomiting, constipation, or diarrhea. He has no urinary complaints. Patient offers no further specific complaints today.  REVIEW OF SYSTEMS:   Review of Systems  Constitutional: Negative.  Negative for fever, malaise/fatigue and weight loss.  Respiratory: Negative.  Negative for cough, hemoptysis and shortness of breath.   Cardiovascular: Negative.  Negative for chest pain and leg swelling.  Gastrointestinal: Negative.  Negative for abdominal pain.  Genitourinary: Negative for flank pain and frequency.  Neurological: Positive for tremors and loss of consciousness. Negative for sensory change, focal weakness and weakness.  Psychiatric/Behavioral: Positive for memory loss. The patient is not nervous/anxious.     As per HPI. Otherwise, a complete review of systems is negative.  PAST MEDICAL HISTORY: Past Medical History:  Diagnosis Date    . BPH (benign prostatic hyperplasia)   . CAD (coronary artery disease)   . Cancer ([place])    skin  . Colon polyps   . Diabetes ([place])   . Dyslipidemia   . Erectile dysfunction   . Gait disorder   . Heart murmur   . History of chest pain   . History of headache    chronic, recurrent, postconcussive, after fall from a collapsed deck, had been treated wtih prn fiorinal  . HOH (hard of hearing)   . Hypertension   . Lung cancer ([place])   . Myocardial infarction   . Parkinson's disease    Primarily right sided features  . Parkinson's disease ([place])   . Plantar fasciitis     PAST SURGICAL HISTORY: Past Surgical History:  Procedure Laterality Date  . cardicac stent    . CATARACT EXTRACTION W/PHACO Right 06/28/2014   Procedure: CATARACT EXTRACTION PHACO AND INTRAOCULAR LENS PLACEMENT (IOC);  Surgeon: Lyla Glassing, MD;  Location: [facility] ORS;  Service: Ophthalmology;  Laterality: Right;  [place]    1:07.4               AP     12.6             CDE   8.50  . CORONARY ANGIOPLASTY     stent  . HEMORRHOID SURGERY    . TONSILLECTOMY AND ADENOIDECTOMY      FAMILY HISTORY: Family History  Problem Relation Age of Onset  . Heart disease Mother   . Heart attack Mother   . Diabetes Mother   . Tremor Mother     Benign essential tremor, but not parkinson's disease  . Prostate cancer  Father   . Heart disease Father     ADVANCED DIRECTIVES (Y/N):  N  HEALTH MAINTENANCE: Social History  Substance Use Topics  . Smoking status: Former Smoker    Quit date: 02/03/1955  . Smokeless tobacco: Never Used  . Alcohol use No     Colonoscopy:  PAP:  Bone density:  Lipid panel:  Allergies  Allergen Reactions  . Tramadol Other (See Comments)    Current Outpatient Prescriptions  Medication Sig Dispense Refill  . ALPRAZolam (XANAX) 0.25 MG tablet Take 0.25 mg by mouth at bedtime as needed for anxiety.    Marland Kitchen amantadine (SYMMETREL) 100 MG capsule Take 100 mg by mouth 2 (two) times daily.    Marland Kitchen aspirin  EC 81 MG tablet Take 81 mg by mouth daily.    . butalbital-aspirin-caffeine (FIORINAL) 50-325-40 MG capsule Take 1 capsule by mouth 4 (four) times daily as needed for migraine.    . carbidopa-levodopa (SINEMET CR) 50-200 MG per tablet Take 7 tablets by mouth daily. Pt takes 2 tablet at 7am, 2 tablets at 12 pm, and one tablet at 2pm.    . donepezil (ARICEPT) 5 MG tablet Take 5 mg by mouth daily.    . fludrocortisone (FLORINEF) 0.1 MG tablet Take 0.1 mg by mouth daily.     Marland Kitchen glimepiride (AMARYL) 2 MG tablet Take 1 mg by mouth 2 (two) times daily.     Marland Kitchen linagliptin (TRADJENTA) 5 MG TABS tablet Take 5 mg by mouth daily.    . megestrol (MEGACE) 40 MG tablet Take 1 tablet (40 mg total) by mouth daily. 30 tablet 1  . metFORMIN (GLUCOPHAGE) 1000 MG tablet Take 1,000 mg by mouth 2 (two) times daily.     . ondansetron (ZOFRAN) 8 MG tablet Take 1 tablet (8 mg total) by mouth 2 (two) times daily as needed (Nausea or vomiting). 30 tablet 1  . prochlorperazine (COMPAZINE) 10 MG tablet Take 1 tablet (10 mg total) by mouth every 6 (six) hours as needed (Nausea or vomiting). 30 tablet 1  . rasagiline (AZILECT) 1 MG TABS tablet Take 1 mg by mouth at bedtime.     Marland Kitchen rOPINIRole (REQUIP) 4 MG tablet Take 14 mg by mouth at bedtime. Pt takes with two 7m tablets.    . simvastatin (ZOCOR) 40 MG tablet Take 40 mg by mouth at bedtime.    . tamsulosin (FLOMAX) 0.4 MG CAPS capsule Take 0.8 mg by mouth daily at 12 noon. At 12 noon.    . triamcinolone cream (KENALOG) 0.1 % APPLY TWICE DAILY TO AFFECTED AREAS ON LEGS AS NEEDED  0  . potassium chloride SA (K-DUR,KLOR-CON) 20 MEQ tablet Take 2 tablets (40 mEq total) by mouth 2 (two) times daily. 60 tablet 1   No current facility-administered medications for this visit.     OBJECTIVE: Vitals:   01/28/16 1353  BP: (!) 93/57  Pulse: 84  Resp: 18  Temp: 98.4 F (36.9 C)     Body mass index is 23.49 kg/m.    ECOG FS:3 - Symptomatic, >50% confined to bed  General: No acute  distress. Sitting in a wheelchair.  Eyes: Pink conjunctiva, anicteric sclera. Lungs: Clear to auscultation bilaterally. Heart: Regular rate and rhythm. No rubs, murmurs, or gallops. Abdomen: Soft, nontender, nondistended. No organomegaly noted, normoactive bowel sounds. Musculoskeletal: No edema, cyanosis, or clubbing. Neuro: Lethargic. Cranial nerves grossly intact. Skin: No rashes or petechiae noted. Psych: Normal affect.   LAB RESULTS:  Lab Results  Component Value Date  NA 136 01/28/2016   K 2.7 (LL) 01/28/2016   CL 100 (L) 01/28/2016   CO2 26 01/28/2016   GLUCOSE 183 (H) 01/28/2016   BUN 7 01/28/2016   CREATININE 0.66 01/28/2016   CALCIUM 8.4 (L) 01/28/2016   PROT 7.0 01/28/2016   ALBUMIN 2.9 (L) 01/28/2016   AST 23 01/28/2016   ALT <5 (L) 01/28/2016   ALKPHOS 68 01/28/2016   BILITOT 0.5 01/28/2016   GFRNONAA >60 01/28/2016   GFRAA >60 01/28/2016    Lab Results  Component Value Date   WBC 6.9 01/28/2016   NEUTROABS 3.9 01/28/2016   HGB 10.4 (L) 01/28/2016   HCT 31.3 (L) 01/28/2016   MCV 92.4 01/28/2016   PLT 227 01/28/2016     STUDIES: No results found.  ASSESSMENT: Stage IV adenocarcinoma of the right lower lobe lung with adrenal and bony metastasis.  PLAN:    1. 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 The differential diagnosis based on the pattern of immunoreactivity includes metastatic gastrointestinal adenocarcinoma and pulmonary adenocarcinoma with intestinal / enteric differentiation. MMR did not reveal any loss of nuclear expression, therefore there is a low probability of MSI-H. PET scan results reviewed independently with adrenal metastasis, retroperitoneal disease, as well as PET positive bone lesions. Patient still wishes to pursue aggressive treatment. We will proceed with cycle 3 of Keytruda  today, but given patient's multiple comorbidities and declining performance status will have to reconsider additional treatments. Patient will return to clinic in 4 weeks with repeat imaging using PET scan as well as further discussion of whether or not to continue treatment. 2. Seizure-like activity: Unlikely seizure. Unclear if this was related to hypotension or hypoglycemia. Monitor. 3. Anemia: Mild, monitor. 4. Hypotension: Monitor. 5. Confusion/falls: Multifactorial including related to patient's underlying Parkinson's.  Patient expressed understanding and was in agreement with this plan. He also understands that He can call clinic at any time with any questions, concerns, or complaints.   Primary cancer of right lower lobe of lung [facility])   Staging form: Lung, AJCC 7th Edition   - Clinical stage from 12/01/2015: Stage IV (T2b, N2, M1b) - Signed by Lloyd Huger, MD on 12/01/2015  Lloyd Huger, MD   01/30/2016 12:27 PM

## 2016-01-29 LAB — THYROID PANEL WITH TSH
FREE THYROXINE INDEX: 1.7 (ref 1.2–4.9)
T3 UPTAKE RATIO: 30 % (ref 24–39)
T4 TOTAL: 5.5 ug/dL (ref 4.5–12.0)
TSH: 1.96 u[IU]/mL (ref 0.450–4.500)

## 2016-02-10 ENCOUNTER — Inpatient Hospital Stay: Payer: Medicare Other | Attending: Oncology

## 2016-02-10 ENCOUNTER — Other Ambulatory Visit: Payer: Self-pay | Admitting: *Deleted

## 2016-02-10 ENCOUNTER — Telehealth: Payer: Self-pay | Admitting: *Deleted

## 2016-02-10 VITALS — BP 115/67 | HR 64 | Temp 95.2°F | Resp 18

## 2016-02-10 DIAGNOSIS — R110 Nausea: Secondary | ICD-10-CM

## 2016-02-10 DIAGNOSIS — E860 Dehydration: Secondary | ICD-10-CM | POA: Insufficient documentation

## 2016-02-10 DIAGNOSIS — Z79899 Other long term (current) drug therapy: Secondary | ICD-10-CM | POA: Insufficient documentation

## 2016-02-10 MED ORDER — SODIUM CHLORIDE 0.9 % IV SOLN
Freq: Once | INTRAVENOUS | Status: AC
  Administered 2016-02-10: 14:00:00 via INTRAVENOUS
  Filled 2016-02-10: qty 1000

## 2016-02-10 MED ORDER — SODIUM CHLORIDE 0.9 % IV SOLN
Freq: Once | INTRAVENOUS | Status: AC
  Filled 2016-02-10: qty 1000

## 2016-02-10 MED ORDER — SODIUM CHLORIDE 0.9 % IV SOLN
Freq: Once | INTRAVENOUS | Status: AC
  Administered 2016-02-10: 15:00:00 via INTRAVENOUS
  Filled 2016-02-10: qty 4

## 2016-02-10 NOTE — Telephone Encounter (Signed)
IVF NS 1 liter over an hour per Dr Grayland Ormond. Patient will be here shortly per [name]

## 2016-02-11 ENCOUNTER — Ambulatory Visit: Payer: Medicare Other | Attending: Radiation Oncology | Admitting: Radiation Oncology

## 2016-02-19 ENCOUNTER — Inpatient Hospital Stay
Admission: EM | Admit: 2016-02-19 | Discharge: 2016-02-21 | DRG: 054 | Disposition: A | Discharge status: Hospice | Attending: Internal Medicine | Admitting: Internal Medicine

## 2016-02-19 ENCOUNTER — Emergency Department

## 2016-02-19 ENCOUNTER — Encounter: Payer: Self-pay | Admitting: Emergency Medicine

## 2016-02-19 DIAGNOSIS — Z955 Presence of coronary angioplasty implant and graft: Secondary | ICD-10-CM | POA: Diagnosis not present

## 2016-02-19 DIAGNOSIS — I10 Essential (primary) hypertension: Secondary | ICD-10-CM | POA: Diagnosis present

## 2016-02-19 DIAGNOSIS — E785 Hyperlipidemia, unspecified: Secondary | ICD-10-CM | POA: Diagnosis present

## 2016-02-19 DIAGNOSIS — C7970 Secondary malignant neoplasm of unspecified adrenal gland: Secondary | ICD-10-CM | POA: Diagnosis not present

## 2016-02-19 DIAGNOSIS — F0280 Dementia in other diseases classified elsewhere without behavioral disturbance: Secondary | ICD-10-CM | POA: Diagnosis present

## 2016-02-19 DIAGNOSIS — Z87891 Personal history of nicotine dependence: Secondary | ICD-10-CM | POA: Diagnosis not present

## 2016-02-19 DIAGNOSIS — G20 Parkinson's disease: Secondary | ICD-10-CM | POA: Diagnosis present

## 2016-02-19 DIAGNOSIS — E1151 Type 2 diabetes mellitus with diabetic peripheral angiopathy without gangrene: Secondary | ICD-10-CM | POA: Diagnosis present

## 2016-02-19 DIAGNOSIS — C3490 Malignant neoplasm of unspecified part of unspecified bronchus or lung: Secondary | ICD-10-CM | POA: Diagnosis present

## 2016-02-19 DIAGNOSIS — Z66 Do not resuscitate: Secondary | ICD-10-CM | POA: Diagnosis present

## 2016-02-19 DIAGNOSIS — R569 Unspecified convulsions: Secondary | ICD-10-CM | POA: Diagnosis not present

## 2016-02-19 DIAGNOSIS — Z8249 Family history of ischemic heart disease and other diseases of the circulatory system: Secondary | ICD-10-CM

## 2016-02-19 DIAGNOSIS — Z86010 Personal history of colonic polyps: Secondary | ICD-10-CM | POA: Diagnosis not present

## 2016-02-19 DIAGNOSIS — R531 Weakness: Secondary | ICD-10-CM | POA: Diagnosis not present

## 2016-02-19 DIAGNOSIS — E43 Unspecified severe protein-calorie malnutrition: Secondary | ICD-10-CM | POA: Diagnosis present

## 2016-02-19 DIAGNOSIS — H9190 Unspecified hearing loss, unspecified ear: Secondary | ICD-10-CM | POA: Diagnosis present

## 2016-02-19 DIAGNOSIS — C3431 Malignant neoplasm of lower lobe, right bronchus or lung: Secondary | ICD-10-CM | POA: Diagnosis not present

## 2016-02-19 DIAGNOSIS — Z515 Encounter for palliative care: Secondary | ICD-10-CM | POA: Diagnosis present

## 2016-02-19 DIAGNOSIS — G936 Cerebral edema: Secondary | ICD-10-CM | POA: Diagnosis present

## 2016-02-19 DIAGNOSIS — Z6820 Body mass index (BMI) 20.0-20.9, adult: Secondary | ICD-10-CM

## 2016-02-19 DIAGNOSIS — N529 Male erectile dysfunction, unspecified: Secondary | ICD-10-CM | POA: Diagnosis present

## 2016-02-19 DIAGNOSIS — R627 Adult failure to thrive: Secondary | ICD-10-CM | POA: Diagnosis present

## 2016-02-19 DIAGNOSIS — Z7984 Long term (current) use of oral hypoglycemic drugs: Secondary | ICD-10-CM

## 2016-02-19 DIAGNOSIS — R296 Repeated falls: Secondary | ICD-10-CM | POA: Diagnosis present

## 2016-02-19 DIAGNOSIS — C7951 Secondary malignant neoplasm of bone: Secondary | ICD-10-CM | POA: Diagnosis not present

## 2016-02-19 DIAGNOSIS — Z8042 Family history of malignant neoplasm of prostate: Secondary | ICD-10-CM

## 2016-02-19 DIAGNOSIS — R55 Syncope and collapse: Secondary | ICD-10-CM

## 2016-02-19 DIAGNOSIS — I951 Orthostatic hypotension: Secondary | ICD-10-CM | POA: Diagnosis present

## 2016-02-19 DIAGNOSIS — Z833 Family history of diabetes mellitus: Secondary | ICD-10-CM

## 2016-02-19 DIAGNOSIS — Z7982 Long term (current) use of aspirin: Secondary | ICD-10-CM

## 2016-02-19 DIAGNOSIS — Z885 Allergy status to narcotic agent status: Secondary | ICD-10-CM

## 2016-02-19 DIAGNOSIS — I252 Old myocardial infarction: Secondary | ICD-10-CM | POA: Diagnosis not present

## 2016-02-19 DIAGNOSIS — I2510 Atherosclerotic heart disease of native coronary artery without angina pectoris: Secondary | ICD-10-CM | POA: Diagnosis present

## 2016-02-19 DIAGNOSIS — Z79899 Other long term (current) drug therapy: Secondary | ICD-10-CM

## 2016-02-19 DIAGNOSIS — C7931 Secondary malignant neoplasm of brain: Secondary | ICD-10-CM | POA: Diagnosis not present

## 2016-02-19 DIAGNOSIS — L8990 Pressure ulcer of unspecified site, unspecified stage: Secondary | ICD-10-CM | POA: Insufficient documentation

## 2016-02-19 DIAGNOSIS — N400 Enlarged prostate without lower urinary tract symptoms: Secondary | ICD-10-CM | POA: Diagnosis present

## 2016-02-19 HISTORY — DX: Unspecified dementia, unspecified severity, without behavioral disturbance, psychotic disturbance, mood disturbance, and anxiety: F03.90

## 2016-02-19 LAB — GLUCOSE, CAPILLARY
GLUCOSE-CAPILLARY: 213 mg/dL — AB (ref 65–99)
Glucose-Capillary: 171 mg/dL — ABNORMAL HIGH (ref 65–99)

## 2016-02-19 LAB — BASIC METABOLIC PANEL
ANION GAP: 7 (ref 5–15)
BUN: 11 mg/dL (ref 6–20)
CALCIUM: 8.9 mg/dL (ref 8.9–10.3)
CO2: 29 mmol/L (ref 22–32)
CREATININE: 0.65 mg/dL (ref 0.61–1.24)
Chloride: 101 mmol/L (ref 101–111)
Glucose, Bld: 120 mg/dL — ABNORMAL HIGH (ref 65–99)
Potassium: 3.4 mmol/L — ABNORMAL LOW (ref 3.5–5.1)
SODIUM: 137 mmol/L (ref 135–145)

## 2016-02-19 LAB — CBC
HCT: 33 % — ABNORMAL LOW (ref 40.0–52.0)
HEMOGLOBIN: 10.9 g/dL — AB (ref 13.0–18.0)
MCH: 30.5 pg (ref 26.0–34.0)
MCHC: 33.1 g/dL (ref 32.0–36.0)
MCV: 92.3 fL (ref 80.0–100.0)
PLATELETS: 218 10*3/uL (ref 150–440)
RBC: 3.58 MIL/uL — AB (ref 4.40–5.90)
RDW: 16.6 % — ABNORMAL HIGH (ref 11.5–14.5)
WBC: 7.8 10*3/uL (ref 3.8–10.6)

## 2016-02-19 LAB — URINALYSIS, COMPLETE (UACMP) WITH MICROSCOPIC
Bacteria, UA: NONE SEEN
Bilirubin Urine: NEGATIVE
GLUCOSE, UA: NEGATIVE mg/dL
Hgb urine dipstick: NEGATIVE
KETONES UR: 5 mg/dL — AB
LEUKOCYTES UA: NEGATIVE
Nitrite: NEGATIVE
PH: 5 (ref 5.0–8.0)
Protein, ur: 30 mg/dL — AB
Specific Gravity, Urine: 1.02 (ref 1.005–1.030)
Squamous Epithelial / LPF: NONE SEEN

## 2016-02-19 LAB — TROPONIN I: Troponin I: 0.03 ng/mL (ref <0.03)

## 2016-02-19 MED ORDER — ASPIRIN EC 81 MG PO TBEC
81.0000 mg | DELAYED_RELEASE_TABLET | Freq: Every day | ORAL | Status: DC
  Administered 2016-02-20 – 2016-02-21 (×2): 81 mg via ORAL
  Filled 2016-02-19 (×2): qty 1

## 2016-02-19 MED ORDER — CARBIDOPA-LEVODOPA ER 50-200 MG PO TBCR
2.0000 | EXTENDED_RELEASE_TABLET | Freq: Every day | ORAL | Status: DC
  Administered 2016-02-20 – 2016-02-21 (×2): 2 via ORAL
  Filled 2016-02-19 (×2): qty 2

## 2016-02-19 MED ORDER — SIMVASTATIN 20 MG PO TABS
40.0000 mg | ORAL_TABLET | Freq: Every day | ORAL | Status: DC
  Administered 2016-02-19 – 2016-02-20 (×2): 40 mg via ORAL
  Filled 2016-02-19 (×2): qty 2

## 2016-02-19 MED ORDER — RASAGILINE MESYLATE 1 MG PO TABS
1.0000 mg | ORAL_TABLET | Freq: Every day | ORAL | Status: DC
  Administered 2016-02-19 – 2016-02-20 (×2): 1 mg via ORAL
  Filled 2016-02-19 (×3): qty 1

## 2016-02-19 MED ORDER — TAMSULOSIN HCL 0.4 MG PO CAPS
0.8000 mg | ORAL_CAPSULE | Freq: Every day | ORAL | Status: DC
  Administered 2016-02-20 – 2016-02-21 (×2): 0.8 mg via ORAL
  Filled 2016-02-19 (×2): qty 2

## 2016-02-19 MED ORDER — DONEPEZIL HCL 5 MG PO TABS
5.0000 mg | ORAL_TABLET | Freq: Every day | ORAL | Status: DC
  Administered 2016-02-19 – 2016-02-21 (×3): 5 mg via ORAL
  Filled 2016-02-19 (×3): qty 1

## 2016-02-19 MED ORDER — FLUDROCORTISONE ACETATE 0.1 MG PO TABS
0.1000 mg | ORAL_TABLET | Freq: Every day | ORAL | Status: DC
  Administered 2016-02-20 – 2016-02-21 (×2): 0.1 mg via ORAL
  Filled 2016-02-19 (×2): qty 1

## 2016-02-19 MED ORDER — ACETAMINOPHEN 325 MG PO TABS
650.0000 mg | ORAL_TABLET | Freq: Four times a day (QID) | ORAL | Status: DC | PRN
Start: 2016-02-19 — End: 2016-02-21

## 2016-02-19 MED ORDER — CARBIDOPA-LEVODOPA ER 50-200 MG PO TBCR
2.0000 | EXTENDED_RELEASE_TABLET | Freq: Every evening | ORAL | Status: DC
  Administered 2016-02-19 – 2016-02-20 (×2): 2 via ORAL
  Filled 2016-02-19 (×2): qty 2

## 2016-02-19 MED ORDER — ONDANSETRON HCL 4 MG PO TABS: 4.0000 mg | ORAL_TABLET | Freq: Four times a day (QID) | ORAL | Status: DC | PRN

## 2016-02-19 MED ORDER — SODIUM CHLORIDE 0.9 % IV SOLN
INTRAVENOUS | Status: DC
  Administered 2016-02-19 – 2016-02-20 (×3): via INTRAVENOUS

## 2016-02-19 MED ORDER — DEXAMETHASONE SODIUM PHOSPHATE 4 MG/ML IJ SOLN
4.0000 mg | Freq: Once | INTRAMUSCULAR | Status: AC
  Administered 2016-02-19: 4 mg via INTRAVENOUS
  Filled 2016-02-19: qty 1

## 2016-02-19 MED ORDER — INSULIN ASPART 100 UNIT/ML ~~LOC~~ SOLN
0.0000 [IU] | Freq: Every day | SUBCUTANEOUS | Status: DC
  Administered 2016-02-19: 23:00:00 2 [IU] via SUBCUTANEOUS
  Filled 2016-02-19: qty 2

## 2016-02-19 MED ORDER — INSULIN ASPART 100 UNIT/ML ~~LOC~~ SOLN
0.0000 [IU] | Freq: Three times a day (TID) | SUBCUTANEOUS | Status: DC
  Administered 2016-02-19: 2 [IU] via SUBCUTANEOUS
  Administered 2016-02-20: 3 [IU] via SUBCUTANEOUS
  Administered 2016-02-20: 2 [IU] via SUBCUTANEOUS
  Administered 2016-02-20 – 2016-02-21 (×2): 5 [IU] via SUBCUTANEOUS
  Administered 2016-02-21: 2 [IU] via SUBCUTANEOUS
  Filled 2016-02-19: qty 3
  Filled 2016-02-19 (×2): qty 2
  Filled 2016-02-19: qty 5
  Filled 2016-02-19: qty 2
  Filled 2016-02-19: qty 5

## 2016-02-19 MED ORDER — CARBIDOPA-LEVODOPA ER 50-200 MG PO TBCR
3.0000 | EXTENDED_RELEASE_TABLET | ORAL | Status: DC
  Administered 2016-02-20 – 2016-02-21 (×2): 3 via ORAL
  Filled 2016-02-19 (×2): qty 3

## 2016-02-19 MED ORDER — SODIUM CHLORIDE 0.9 % IV SOLN
1000.0000 mg | Freq: Once | INTRAVENOUS | Status: AC
  Administered 2016-02-19: 1000 mg via INTRAVENOUS
  Filled 2016-02-19: qty 10

## 2016-02-19 MED ORDER — ACETAMINOPHEN 650 MG RE SUPP: 650.0000 mg | Freq: Four times a day (QID) | RECTAL | Status: DC | PRN

## 2016-02-19 MED ORDER — ENOXAPARIN SODIUM 40 MG/0.4ML ~~LOC~~ SOLN
40.0000 mg | SUBCUTANEOUS | Status: DC
  Administered 2016-02-19 – 2016-02-20 (×2): 40 mg via SUBCUTANEOUS
  Filled 2016-02-19 (×2): qty 0.4

## 2016-02-19 MED ORDER — ONDANSETRON HCL 4 MG/2ML IJ SOLN: 4.0000 mg | Freq: Four times a day (QID) | INTRAMUSCULAR | Status: DC | PRN

## 2016-02-19 MED ORDER — CARBIDOPA-LEVODOPA ER 50-200 MG PO TBCR: 7.0000 | EXTENDED_RELEASE_TABLET | Freq: Every day | ORAL | Status: DC

## 2016-02-19 MED ORDER — DEXAMETHASONE SODIUM PHOSPHATE 4 MG/ML IJ SOLN
4.0000 mg | Freq: Four times a day (QID) | INTRAMUSCULAR | Status: DC
  Administered 2016-02-19 – 2016-02-21 (×7): 4 mg via INTRAVENOUS
  Filled 2016-02-19 (×7): qty 1

## 2016-02-19 MED ORDER — LEVETIRACETAM 500 MG/5ML IV SOLN
500.0000 mg | Freq: Two times a day (BID) | INTRAVENOUS | Status: DC
  Administered 2016-02-19 – 2016-02-21 (×4): 500 mg via INTRAVENOUS
  Filled 2016-02-19 (×5): qty 5

## 2016-02-19 NOTE — Progress Notes (Signed)
ED visit made. Patient is currently followed by [facility] [place] at home with a hospice diagnosis of Parkinson's. He is a DNR code. Patient was sent to the ED via EMS for evaluation of "passing out spells".  Patient seen lying on the ED stretcher, alert and oriented, speech difficult to understand. Reports he is feeling ok. Patient reports his wife and son are with him, but had stepped out to the cafeteria. Writer spoke with attending ED physician Dr. Reita Cliche and plan is for admission. Hospice team updated. Will continue to follow and update hospice team. Thank you. Flo Shanks RN, BSN , [place] [facility] [place], [facility] [phone] c

## 2016-02-19 NOTE — H&P (Signed)
Craig Dennis at [place] NAME: Craig Dennis    MR#:  [id number]  DATE OF BIRTH:  December 20, 1933  DATE OF ADMISSION:  02/19/2016  PRIMARY CARE PHYSICIAN: Idelle Crouch, MD   REQUESTING/REFERRING PHYSICIAN: Dr. Lisa Roca  CHIEF COMPLAINT:   Chief Complaint  Patient presents with  . Weakness    HISTORY OF PRESENT ILLNESS:  Craig Dennis  is a 83 y.o. male with a known history of Lung cancer with metastatic disease, dementia, diabetes, hypertension, BPH, history of coronary artery disease Parkinson's who presents to the hospital due to multiple episodes of suspected syncope. Patient himself cannot give a good history therefore most of the history obtained from the wife at bedside. As per the wife patient has been having these passing out episodes now for the past few months. They have gotten more frequent over the past few weeks. She describes the episode as his eyes rolled backwards and he is unresponsive briefly. He had 3 episodes early this morning and therefore she brought him to the ER for further evaluation. Patient had no urinary incontinence or any violent shaking or any headaches or any other associated symptoms. Patient underwent a CT head which showed vasogenic edema and possible evidence of metastatic disease. Hospitalist services were contacted further treatment and evaluation.  PAST MEDICAL HISTORY:   Past Medical History:  Diagnosis Date  . BPH (benign prostatic hyperplasia)   . CAD (coronary artery disease)   . Cancer ([place])    skin  . Colon polyps   . Dementia   . Diabetes ([place])   . Dyslipidemia   . Erectile dysfunction   . Gait disorder   . Heart murmur   . History of chest pain   . History of headache    chronic, recurrent, postconcussive, after fall from a collapsed deck, had been treated wtih prn fiorinal  . HOH (hard of hearing)   . Hypertension   . Lung cancer ([place])   . Myocardial infarction   . Parkinson's disease     Primarily right sided features  . Parkinson's disease ([place])   . Plantar fasciitis     PAST SURGICAL HISTORY:   Past Surgical History:  Procedure Laterality Date  . cardicac stent    . CATARACT EXTRACTION W/PHACO Right 06/28/2014   Procedure: CATARACT EXTRACTION PHACO AND INTRAOCULAR LENS PLACEMENT (IOC);  Surgeon: Lyla Glassing, MD;  Location: [facility] ORS;  Service: Ophthalmology;  Laterality: Right;  [place]    1:07.4               AP     12.6             CDE   8.50  . CORONARY ANGIOPLASTY     stent  . HEMORRHOID SURGERY    . TONSILLECTOMY AND ADENOIDECTOMY      SOCIAL HISTORY:   Social History  Substance Use Topics  . Smoking status: Former Smoker    Quit date: 02/03/1955  . Smokeless tobacco: Never Used  . Alcohol use No    FAMILY HISTORY:   Family History  Problem Relation Age of Onset  . Heart disease Mother   . Heart attack Mother   . Diabetes Mother   . Tremor Mother     Benign essential tremor, but not parkinson's disease  . Prostate cancer Father   . Heart disease Father     DRUG ALLERGIES:   Allergies  Allergen Reactions  . Tramadol Other (See Comments)  REVIEW OF SYSTEMS:   Review of Systems  Unable to perform ROS: Dementia    MEDICATIONS AT HOME:   Prior to Admission medications   Medication Sig Start Date End Date Taking? Authorizing Provider  aspirin EC 81 MG tablet Take 81 mg by mouth daily.   Yes Historical Provider, MD  carbidopa-levodopa (SINEMET CR) 50-200 MG per tablet Take 7 tablets by mouth daily. Pt takes 3  tabs at 7am, 2 tablets at 12 pm, and 2 tabs at 4pm.   Yes Historical Provider, MD  donepezil (ARICEPT) 5 MG tablet Take 5 mg by mouth daily.   Yes Historical Provider, MD  fludrocortisone (FLORINEF) 0.1 MG tablet Take 0.1 mg by mouth daily.    Yes Historical Provider, MD  metFORMIN (GLUCOPHAGE) 1000 MG tablet Take 1,000 mg by mouth 2 (two) times daily.    Yes Historical Provider, MD  rasagiline (AZILECT) 1 MG TABS tablet Take 1 mg  by mouth at bedtime.    Yes Historical Provider, MD  simvastatin (ZOCOR) 40 MG tablet Take 40 mg by mouth at bedtime.   Yes Historical Provider, MD  tamsulosin (FLOMAX) 0.4 MG CAPS capsule Take 0.8 mg by mouth daily at 12 noon. At 12 noon.   Yes Historical Provider, MD  megestrol (MEGACE) 40 MG tablet Take 1 tablet (40 mg total) by mouth daily. Patient not taking: Reported on 02/19/2016 01/06/16   Lloyd Huger, MD  potassium chloride SA (K-DUR,KLOR-CON) 20 MEQ tablet Take 2 tablets (40 mEq total) by mouth 2 (two) times daily. Patient not taking: Reported on 02/19/2016 01/28/16   Lloyd Huger, MD  prochlorperazine (COMPAZINE) 10 MG tablet Take 1 tablet (10 mg total) by mouth every 6 (six) hours as needed (Nausea or vomiting). Patient not taking: Reported on 02/19/2016 12/01/15   Lloyd Huger, MD      VITAL SIGNS:  Blood pressure (!) 165/85, pulse 62, temperature 97.7 F (36.5 C), temperature source Oral, resp. rate 13, height '5\' 10"'$  (1.778 m), weight 73 kg (160 lb 15 oz), SpO2 99 %.  PHYSICAL EXAMINATION:  Physical Exam  GENERAL:  83 y.o.-year-old patient lying in the bed in no acute distress.  EYES: Pupils equal, round, reactive to light and accommodation. No scleral icterus. Extraocular muscles intact.  HEENT: Head atraumatic, normocephalic. Oropharynx and nasopharynx clear. No oropharyngeal erythema, dry oral mucosa  NECK:  Supple, no jugular venous distention. No thyroid enlargement, no tenderness.  LUNGS: Poor Resp. effort, no wheezing, rales, rhonchi. No use of accessory muscles of respiration.  CARDIOVASCULAR: S1, S2 RRR. II/VI SEM at RSB, rubs, gallops, clicks.  ABDOMEN: Soft, nontender, nondistended. Bowel sounds present. No organomegaly or mass.  EXTREMITIES: No pedal edema, cyanosis, or clubbing. + 2 pedal & radial pulses b/l.   NEUROLOGIC: Cranial nerves II through XII are intact. No focal Motor or sensory deficits appreciated b/l. Globally weak.  PSYCHIATRIC: The  patient is alert and oriented x 1.   SKIN: No obvious rash, lesion, or ulcer.   LABORATORY PANEL:   CBC  Recent Labs Lab 02/19/16 1200  WBC 7.8  HGB 10.9*  HCT 33.0*  PLT 218   ------------------------------------------------------------------------------------------------------------------  Chemistries   Recent Labs Lab 02/19/16 1200  NA 137  K 3.4*  CL 101  CO2 29  GLUCOSE 120*  BUN 11  CREATININE 0.65  CALCIUM 8.9   ------------------------------------------------------------------------------------------------------------------  Cardiac Enzymes  Recent Labs Lab 02/19/16 1200  TROPONINI 0.03*   ------------------------------------------------------------------------------------------------------------------  RADIOLOGY:  Ct Head Wo Contrast  Result Date: 02/19/2016 CLINICAL DATA:  Metastatic lung cancer.  Syncope versus seizure. EXAM: CT HEAD WITHOUT CONTRAST TECHNIQUE: Contiguous axial images were obtained from the base of the skull through the vertex without intravenous contrast. COMPARISON:  CT head 09/10/2015 FINDINGS: Brain: Moderate atrophy. Negative for hydrocephalus. No shift of the midline structures. Progressive low-density in the cerebral white matter bilaterally compared with the prior study. This is most prominent in the parietal lobes bilaterally where low-density extends to the subcortical white matter in the parietal lobes, left greater than right. This pattern is concerning for metastatic disease. However, no definite mass lesion on unenhanced images. Mild atrophy and chronic microvascular ischemic change in the white matter based on the prior study. Small hyperdensity right parietal cortex unchanged from the prior study most likely calcification. No evidence of hemorrhage. Vascular: No hyperdense vessel or unexpected calcification. Skull: No skull lesion identified. Sinuses/Orbits: Negative Other: None IMPRESSION: Extensive white matter hypodensity  bilaterally. This has progressed significantly in the parietal lobes since the prior study of 09/10/2015 and may represent vasogenic edema. This is concerning for metastatic disease and further evaluation is suggested. MRI brain without with contrast is suggested for further evaluation. Less likely are white matter changes related to radiation/ chemotherapy. Small density in the right parietal cortex unchanged from the prior study most likely calcification. Electronically Signed   By: Franchot Gallo M.D.   On: 02/19/2016 13:54     IMPRESSION AND PLAN:   83 year old male with past medical history of lung cancer with metastatic disease, history of Parkinson's disease, hypertension, diabetes, dementia who presented to the hospital due to multiple episodes of passing out.   1. Syncope-patient has had multiple episodes of syncope today. It is suspected to be secondary to seizures.  CT head was positive for vasogenic edema and possible evidence of metastatic disease. -Patient has been started on IV Keppra, we'll continue seizure precautions.  2. Seizures-this is the cause of patient's multiple passing out episodes. -Place on seizure precautions, CT head positive for vasogenic edema and possible metastatic disease. -We'll get MRI of the brain, start on Decadron and IV Keppra.  -appreciate neurosurgical consult, we'll get neurological evaluation.  3. History of lung cancer-patient is followed by Dr. Grayland Ormond. He is currently on [place] -Plan was for her to get a PET scan next week. -Now presenting with possible brain metastases. I will get oncology consult to discuss prognosis with the patient.  4. History of orthostatic hypotension-continue Florinef. Next  5. Dementia-continue Aricept.  6. Diabetes type 2 without complication-hold metformin. Place on sliding scale insulin.  7. Hyperlipidemia-continue simvastatin.  8. BPH-continue Flomax.  9. History of Parkinson's disease-continue  Sinemet.    All the records are reviewed and case discussed with ED provider. Management plans discussed with the patient, family and they are in agreement.  CODE STATUS: DO NOT RESUSCITATE  TOTAL TIME TAKING CARE OF THIS PATIENT: 45 minutes.    Henreitta Leber M.D on 02/19/2016 at 4:46 PM  Between 7am to 6pm - Pager - [phone]  After 6pm go to [email or web address] - password [email or web address] [facility]  Office  [phone]  CC: Primary care physician; Idelle Crouch, MD

## 2016-02-19 NOTE — Consult Note (Signed)
Referring Physician:  No referring provider defined for this encounter.  Primary Physician:  Idelle Crouch, MD  Chief Complaint:  'passing out' today  History of Present Illness: Patient is a 83 y.o. Right handed male with PMHx significant for lung CA on immunotherapy and Parkinson's Disease who has had numerous 'passing out' episodes over the day.  Family describes his eyes rolling to the right and him becoming unresponsive for about 15 seconds and then returning to his normal neurological condition.  He had about 3 of these episodes today, but has had a few of them over the last 6 months.  No shaking when this happens.  No loss of urinary continence.  At present, he feels at his neurological baseline.  No headaches.  No weakness.  No problems with his voice, though he does slur some of his words due to the loss of his lower dentures.   The symptoms are causing a significant impact on the patient's life.   Review of Systems:  A 10 point review of systems is negative, except for the pertinent positives and negatives detailed in the HPI.  Past Medical History: Past Medical History:  Diagnosis Date  . BPH (benign prostatic hyperplasia)   . CAD (coronary artery disease)   . Cancer ([place])    skin  . Colon polyps   . Dementia   . Diabetes ([place])   . Dyslipidemia   . Erectile dysfunction   . Gait disorder   . Heart murmur   . History of chest pain   . History of headache    chronic, recurrent, postconcussive, after fall from a collapsed deck, had been treated wtih prn fiorinal  . HOH (hard of hearing)   . Hypertension   . Lung cancer ([place])   . Myocardial infarction   . Parkinson's disease    Primarily right sided features  . Parkinson's disease ([place])   . Plantar fasciitis     Past Surgical History: Past Surgical History:  Procedure Laterality Date  . cardicac stent    . CATARACT EXTRACTION W/PHACO Right 06/28/2014   Procedure: CATARACT EXTRACTION PHACO AND INTRAOCULAR  LENS PLACEMENT (IOC);  Surgeon: Lyla Glassing, MD;  Location: [facility] ORS;  Service: Ophthalmology;  Laterality: Right;  [place]    1:07.4               AP     12.6             CDE   8.50  . CORONARY ANGIOPLASTY     stent  . HEMORRHOID SURGERY    . TONSILLECTOMY AND ADENOIDECTOMY      Problem List: Patient Active Problem List   Diagnosis Date Noted  . Arteriosclerotic cardiovascular disease 12/09/2015  . Benign essential hypertension 12/09/2015  . Dementia due to Parkinson's disease without behavioral disturbance ([place]) 10/31/2015  . Primary cancer of right lower lobe of lung ([place]) 10/24/2015  . TIA (transient ischemic attack) 02/27/2015  . MI (myocardial infarction) 08/01/2013  . ED (erectile dysfunction) 07/15/2013  . Erythematous condition 06/05/2013  . Aortic stenosis 05/08/2013  . CAD S/P percutaneous coronary angioplasty 05/03/2013  . HTN (hypertension) 05/03/2013  . Type 2 diabetes mellitus with vascular disease ([place]) 05/03/2013  . Other and unspecified hyperlipidemia 05/03/2013  . Parkinson disease ([place]) 05/03/2013  . Chronic headache 05/03/2013  . CAD (coronary artery disease), native coronary artery 05/03/2013  . Headache 05/03/2013  . Type 2 diabetes mellitus with diabetic peripheral angiopathy without gangrene ([place]) 05/03/2013  . Abnormality  of gait 03/21/2012  . Paralysis agitans ([place]) 03/21/2012    Allergies: Allergies as of 02/19/2016 - Review Complete 02/19/2016  Allergen Reaction Noted  . Tramadol Other (See Comments) 12/09/2015    Medications: '@ENCMED'$ @  Social History: Social History  Substance Use Topics  . Smoking status: Former Smoker    Quit date: 02/03/1955  . Smokeless tobacco: Never Used  . Alcohol use No    Family Medical History: Family History  Problem Relation Age of Onset  . Heart disease Mother   . Heart attack Mother   . Diabetes Mother   . Tremor Mother     Benign essential tremor, but not parkinson's disease  . Prostate cancer Father    . Heart disease Father     Physical Examination:  General: Patient is well developed, well nourished, calm, collected, and in no apparent distress.  Psychiatric: Patient is non-anxious.  Head:  Pupils equal, round, and reactive to light.  ENT:  Oral mucosa appears well hydrated.  Neck:   Supple.  Full range of motion.  Respiratory: Patient is breathing without any difficulty.  Extremities: No edema.  Vascular: Palpable pulses.  Skin:   On exposed skin, there are no abnormal skin lesions.  NEUROLOGICAL:  General: In no acute distress.   Awake, alert, oriented to person, place, and time.  Pupils equal round and reactive to light.  Facial tone is symmetric.  Tongue protrusion is midline.  There is a slight right pronator drift.    Strength: Side Biceps Triceps Deltoid Interossei Grip Wrist Ext. Wrist Flex.  R '5 5 5 5 5 5 5  '$ L '5 5 5 5 5 5 5   '$ Side Iliopsoas Quads Hamstring PF DF EHL  R '5 5 5 5 5 5  '$ L '5 5 5 5 5 5    '$ Imaging: CT today reveals bilateral white matter hypodensity, consistent with chronic vascular deficiency.  However, there is an area on the left motor strip that may represent a mass lesion.  There is a small hyperdensity consistent with a small hematoma.  No mass effect.  I have personally reviewed the images and agree with the above interpretation.  Assessment and Plan: Mr. Knust is a pleasant 83 y.o. male with likely seizure episode.  I have discussed the condition with the patient, including showing the radiographs and discussing treatment options in layman's terms.  The patient may benefit from conservative management.  Thus, I have recommended the following:  1)  500  mg keppra for seizure prophylaxis. 2)  4 mg decadron IV x 1  3)  MRI of brain with and without contrast. = Thank you for involving me in the care of this patient. I will keep you apprised of the patient's progress.   This note was partially dictated using voice recognition  software, so please excuse any errors that were not corrected.   Blanche East MD,PhD

## 2016-02-19 NOTE — ED Triage Notes (Signed)
Patient from home via [facility]. Family reports increased weakness and lethargy and decreased PO intake for several days. Reports patient fell on the way to restroom today. Patient has history of dementia and Parkinson's.

## 2016-02-19 NOTE — ED Provider Notes (Signed)
[facility] Emergency Department Provider Note ____________________________________________   I have reviewed the triage vital signs and the triage nursing note.  HISTORY  Chief Complaint Weakness   Historian Patient poor historian due to difficult to understand Wife who lives with him, and son provided the additional history  HPI Craig Dennis. is a 83 y.o. male who lives at home with his wife, has been declining in health specialist over the past several weeks in terms of his decreased by mouth intake. He's had a history of Parkinson's for 15 or so years. He's had a history of lung cancer which recently was found to be metastatic for which she is currently on [place], awaiting repeat PET scan possibly next week to determine if there is been any improvement.  Patient has had several episodes in the past where he will essentially have a small episode of passing out. They describe it as he will go unconscious, either stiff or limp and then come back within a few minutes. No preceding symptoms of aura, headache, palpitations, chest pain.  These episodes have happened when he stood up, they've also happened when he drinks Pepsi. They thought it might be related to his Parkinson's. Today it was more frequent, 4 times this morning. This is pretty unusual, typically might have and once a week.    Past Medical History:  Diagnosis Date  . BPH (benign prostatic hyperplasia)   . CAD (coronary artery disease)   . Cancer ([place])    skin  . Colon polyps   . Dementia   . Diabetes ([place])   . Dyslipidemia   . Erectile dysfunction   . Gait disorder   . Heart murmur   . History of chest pain   . History of headache    chronic, recurrent, postconcussive, after fall from a collapsed deck, had been treated wtih prn fiorinal  . HOH (hard of hearing)   . Hypertension   . Lung cancer ([place])   . Myocardial infarction   . Parkinson's disease    Primarily right sided  features  . Parkinson's disease ([place])   . Plantar fasciitis     Patient Active Problem List   Diagnosis Date Noted  . Arteriosclerotic cardiovascular disease 12/09/2015  . Benign essential hypertension 12/09/2015  . Dementia due to Parkinson's disease without behavioral disturbance ([place]) 10/31/2015  . Primary cancer of right lower lobe of lung ([place]) 10/24/2015  . TIA (transient ischemic attack) 02/27/2015  . MI (myocardial infarction) 08/01/2013  . ED (erectile dysfunction) 07/15/2013  . Erythematous condition 06/05/2013  . Aortic stenosis 05/08/2013  . CAD S/P percutaneous coronary angioplasty 05/03/2013  . HTN (hypertension) 05/03/2013  . Type 2 diabetes mellitus with vascular disease ([place]) 05/03/2013  . Other and unspecified hyperlipidemia 05/03/2013  . Parkinson disease ([place]) 05/03/2013  . Chronic headache 05/03/2013  . CAD (coronary artery disease), native coronary artery 05/03/2013  . Headache 05/03/2013  . Type 2 diabetes mellitus with diabetic peripheral angiopathy without gangrene ([place]) 05/03/2013  . Abnormality of gait 03/21/2012  . Paralysis agitans ([place]) 03/21/2012    Past Surgical History:  Procedure Laterality Date  . cardicac stent    . CATARACT EXTRACTION W/PHACO Right 06/28/2014   Procedure: CATARACT EXTRACTION PHACO AND INTRAOCULAR LENS PLACEMENT (IOC);  Surgeon: Lyla Glassing, MD;  Location: [facility] ORS;  Service: Ophthalmology;  Laterality: Right;  [place]    1:07.4               AP  12.6             CDE   8.50  . CORONARY ANGIOPLASTY     stent  . HEMORRHOID SURGERY    . TONSILLECTOMY AND ADENOIDECTOMY      Prior to Admission medications   Medication Sig Start Date End Date Taking? Authorizing Provider  aspirin EC 81 MG tablet Take 81 mg by mouth daily.   Yes Historical Provider, MD  carbidopa-levodopa (SINEMET CR) 50-200 MG per tablet Take 7 tablets by mouth daily. Pt takes 3  tabs at 7am, 2 tablets at 12 pm, and 2 tabs at 4pm.   Yes Historical Provider, MD   donepezil (ARICEPT) 5 MG tablet Take 5 mg by mouth daily.   Yes Historical Provider, MD  fludrocortisone (FLORINEF) 0.1 MG tablet Take 0.1 mg by mouth daily.    Yes Historical Provider, MD  metFORMIN (GLUCOPHAGE) 1000 MG tablet Take 1,000 mg by mouth 2 (two) times daily.    Yes Historical Provider, MD  rasagiline (AZILECT) 1 MG TABS tablet Take 1 mg by mouth at bedtime.    Yes Historical Provider, MD  simvastatin (ZOCOR) 40 MG tablet Take 40 mg by mouth at bedtime.   Yes Historical Provider, MD  tamsulosin (FLOMAX) 0.4 MG CAPS capsule Take 0.8 mg by mouth daily at 12 noon. At 12 noon.   Yes Historical Provider, MD  megestrol (MEGACE) 40 MG tablet Take 1 tablet (40 mg total) by mouth daily. Patient not taking: Reported on 02/19/2016 01/06/16   Lloyd Huger, MD  potassium chloride SA (K-DUR,KLOR-CON) 20 MEQ tablet Take 2 tablets (40 mEq total) by mouth 2 (two) times daily. Patient not taking: Reported on 02/19/2016 01/28/16   Lloyd Huger, MD  prochlorperazine (COMPAZINE) 10 MG tablet Take 1 tablet (10 mg total) by mouth every 6 (six) hours as needed (Nausea or vomiting). Patient not taking: Reported on 02/19/2016 12/01/15   Lloyd Huger, MD    Allergies  Allergen Reactions  . Tramadol Other (See Comments)    Family History  Problem Relation Age of Onset  . Heart disease Mother   . Heart attack Mother   . Diabetes Mother   . Tremor Mother     Benign essential tremor, but not parkinson's disease  . Prostate cancer Father   . Heart disease Father     Social History Social History  Substance Use Topics  . Smoking status: Former Smoker    Quit date: 02/03/1955  . Smokeless tobacco: Never Used  . Alcohol use No    Review of Systems  Constitutional: Negative for fever. Eyes: Negative for visual changes. ENT: Negative for sore throat. Cardiovascular: Negative for chest pain. Respiratory: Negative for shortness of breath. Gastrointestinal: Negative for abdominal  pain, vomiting and diarrhea. Genitourinary: Negative for dysuria. Musculoskeletal: Negative for back pain. Skin: Negative for rash. Neurological: Negative for headache. 10 point Review of Systems otherwise negative ____________________________________________   PHYSICAL EXAM:  VITAL SIGNS: ED Triage Vitals [02/19/16 1150]  Enc Vitals Group     BP 109/76     Pulse Rate 64     Resp 16     Temp 97.7 F (36.5 C)     Temp Source Oral     SpO2 97 %     Weight 160 lb 15 oz (73 kg)     Height '5\' 10"'$  (1.778 m)     Head Circumference      Peak Flow      Pain Score  Pain Loc      Pain Edu?      Excl. in [place]?      Constitutional: Alert and cooperatiev. Soft voice, difficult to understand. HEENT   Head: Normocephalic and atraumatic.      Eyes: Conjunctivae are normal. PERRL. Normal extraocular movements.      Ears:         Nose: No congestion/rhinnorhea.   Mouth/Throat: Mucous membranes are very dry.   Neck: No stridor. Cardiovascular/Chest: Normal rate, regular rhythm.  No murmurs, rubs, or gallops. Respiratory: Normal respiratory effort without tachypnea nor retractions. Breath sounds are clear and equal bilaterally. No wheezes/rales/rhonchi. Gastrointestinal: Soft. No distention, no guarding, no rebound. Nontender.  Genitourinary/rectal:Deferred Musculoskeletal: Nontender with normal range of motion in all extremities. No joint effusions.  No lower extremity tenderness.  No edema. Neurologic:  Some difficulty understanding his speech, no facial droop. No gross or focal neurologic deficits are appreciated. Skin:  Skin is warm, dry and intact. No rash noted. Psychiatric: Cooperative.   ____________________________________________  LABS (pertinent positives/negatives)  Labs Reviewed  BASIC METABOLIC PANEL - Abnormal; Notable for the following:       Result Value   Potassium 3.4 (*)    Glucose, Bld 120 (*)    All other components within normal limits  CBC -  Abnormal; Notable for the following:    RBC 3.58 (*)    Hemoglobin 10.9 (*)    HCT 33.0 (*)    RDW 16.6 (*)    All other components within normal limits  TROPONIN I - Abnormal; Notable for the following:    Troponin I 0.03 (*)    All other components within normal limits  URINALYSIS, COMPLETE (UACMP) WITH MICROSCOPIC    ____________________________________________    EKG I, Lisa Roca, MD, the attending physician have personally viewed and interpreted all ECGs.  64 bpm. Normal sinus rhythm. Narrow chest. Normal axis. Nonspecific ST and T-wave ____________________________________________  RADIOLOGY All Xrays were viewed by me. Imaging interpreted by Radiologist.  CT head without contrast: IMPRESSION: Extensive white matter hypodensity bilaterally. This has progressed significantly in the parietal lobes since the prior study of 09/10/2015 and may represent vasogenic edema. This is concerning for metastatic disease and further evaluation is suggested. MRI brain without with contrast is suggested for further evaluation. Less likely are white matter changes related to radiation/ chemotherapy.  Small density in the right parietal cortex unchanged from the prior study most likely calcification. __________________________________________  PROCEDURES  Procedure(s) performed: None  Critical Care performed: None  ____________________________________________   ED COURSE / ASSESSMENT AND PLAN  Pertinent labs & imaging results that were available during my care of the patient were reviewed by me and considered in my medical decision making (see chart for details).   Patient brought in by family members for 4 episodes of altered mental status that sounds like either syncope versus seizure. Seems little bit more like syncope or TIA given the very short time period, no postictal state. He's been having these episodes, but today was much more frequent. He does have Parkinson's,  and possible this is dysautonomia. No particularly cardiac symptoms described to me.  He also has a history of metastatic lung cancer, they don't know if he has any metastases to the brain, I am going to CT his head.  Ultimately he is failing to thrive at home with significant weight loss, due to decreased by mouth intake from the metastatic cancer.  Given 4 episodes of syncope versus seizure versus TIA,  will admit to the hospital for further workup.  I did not witness an episode here in the emergency department.   CT scan concerning for possible metastasis.  Discussed with Dr. Ples Specter, neurosurgery - recommends admit here with mri.  Start Keppra and Decadron '4mg'$ .  Will see patient in the ED.   CONSULTATIONS:   Neurosurgery by phone and then in person to ER.  Hospitalist for admission.   Patient / Family / Caregiver informed of clinical course, medical decision-making process, and agree with plan.   ___________________________________________   FINAL CLINICAL IMPRESSION(S) / ED DIAGNOSES   Final diagnoses:  Syncope, unspecified syncope type  Brain metastasis ([place])              Note: This dictation was prepared with Dragon dictation. Any transcriptional errors that result from this process are unintentional    Lisa Roca, MD 02/19/16 1436

## 2016-02-20 ENCOUNTER — Inpatient Hospital Stay

## 2016-02-20 LAB — GLUCOSE, CAPILLARY
GLUCOSE-CAPILLARY: 154 mg/dL — AB (ref 65–99)
GLUCOSE-CAPILLARY: 228 mg/dL — AB (ref 65–99)
GLUCOSE-CAPILLARY: 196 mg/dL — AB (ref 65–99)
Glucose-Capillary: 270 mg/dL — ABNORMAL HIGH (ref 65–99)
Glucose-Capillary: 250 mg/dL — ABNORMAL HIGH (ref 65–99)

## 2016-02-20 LAB — CBC
HEMATOCRIT: 32.4 % — AB (ref 40.0–52.0)
HEMOGLOBIN: 10.9 g/dL — AB (ref 13.0–18.0)
MCH: 31.1 pg (ref 26.0–34.0)
MCHC: 33.7 g/dL (ref 32.0–36.0)
MCV: 92.2 fL (ref 80.0–100.0)
Platelets: 198 10*3/uL (ref 150–440)
RBC: 3.51 MIL/uL — ABNORMAL LOW (ref 4.40–5.90)
RDW: 16.2 % — ABNORMAL HIGH (ref 11.5–14.5)
WBC: 3.7 10*3/uL — ABNORMAL LOW (ref 3.8–10.6)

## 2016-02-20 LAB — BASIC METABOLIC PANEL
ANION GAP: 6 (ref 5–15)
BUN: 13 mg/dL (ref 6–20)
CALCIUM: 8.5 mg/dL — AB (ref 8.9–10.3)
CHLORIDE: 103 mmol/L (ref 101–111)
CO2: 28 mmol/L (ref 22–32)
Creatinine, Ser: 0.64 mg/dL (ref 0.61–1.24)
GFR calc non Af Amer: >60 mL/min (ref >60)
Glucose, Bld: 171 mg/dL — ABNORMAL HIGH (ref 65–99)
POTASSIUM: 3.6 mmol/L (ref 3.5–5.1)
Sodium: 137 mmol/L (ref 135–145)

## 2016-02-20 MED ORDER — ENSURE ENLIVE PO LIQD
237.0000 mL | Freq: Three times a day (TID) | ORAL | Status: DC
  Administered 2016-02-20 – 2016-02-21 (×5): 237 mL via ORAL

## 2016-02-20 MED ORDER — GADOBENATE DIMEGLUMINE 529 MG/ML IV SOLN
15.0000 mL | Freq: Once | INTRAVENOUS | Status: AC | PRN
  Administered 2016-02-20: 13:00:00 14 mL via INTRAVENOUS

## 2016-02-20 NOTE — Progress Notes (Addendum)
Visit made. Patient seen lying in bed, eyes closed, wife at bedside, staff RN Sharia Reeve and attending physician Dr. Bridgett Larsson present. Patient continues on IV Keppra and decadron started by neurology.  Brain MRI planned today. He did take his oral medications today.   Mrs. Forton reports that at baseline her husband does sleep a lot through out the day and is up several times during the night, on a good day he can ambulate in the house, other days he pivots from the bed to his electric wheel chair and moves around the house that way. He can also feed himself at times. No discharge plan at this time, Will continue to follow and update hospice team. Flo Shanks RN, BSN, [facility] [place], [facility] [phone] c

## 2016-02-20 NOTE — Progress Notes (Signed)
Advanced Care Plan.  Purpose of Encounter: Hospice care Parties in Attendance: Patient, his wife, hospice staff, RN and me. Patient's Decisional Capacity: No. Subjective/Patient Story: Syncope or seizure Objective/Medical Story: 83 y.o. male with a known history of Lung cancer with metastatic disease, dementia, diabetes, hypertension, BPH, history of coronary artery disease Parkinson's who presents to the hospital due to multiple episodes of suspected syncope. CT head which showed vasogenic edema and possible evidence of metastatic disease. The patient is in hospice care at home but is still getting chemotherapy. Per hospice staff, he will be discharged from hospice care if he still getting chemotherapy for lung cancer. However, the patient is admitted for syncope or seizure secondary to possible brain metastasis. He has Parkinson's dementia and lung cancer, has been a poor prognosis. He is probably not a candidate for any chemotherapy or radiation therapy. I discussed with his wife about hospice care and possible hospice home. Goals of Care Determinations: hospice care.  Plan: Hospice care. Code Status: DO NOT RESUSCITATE.  Time spent discussing advance care planning: 18 minutes.

## 2016-02-20 NOTE — Progress Notes (Signed)
Wife updated on results of MRI today by Dr. Bridgett Larsson calling into patient's room. Current plan of care updated with patient and wife at bedside.

## 2016-02-20 NOTE — Consult Note (Addendum)
Reason for Consult:seizure/syncope episodes  Referring Physician: Dr. Bridgett Larsson   CC: syncope/seizure/metastatic disease   HPI: Craig Dennis. is an 83 y.o. male  Lung cancer with metastatic disease diagnosed last September, dementia, diabetes, hypertension, BPH, history of coronary artery disease Parkinson's who presents to the hospital due to multiple episodes of suspected syncope. At baseline pt lives at home with daughter and he needs significant assistance.For the past 2 years pt has been having frequent falls and increased yesterday.  Pt has 4 falls yesterday with suspected LOC. No seizure like activity. CTH showed potential vasogenic edema in setting of metastasis.     Past Medical History:  Diagnosis Date  . BPH (benign prostatic hyperplasia)   . CAD (coronary artery disease)   . Cancer ([place])    skin  . Colon polyps   . Dementia   . Diabetes ([place])   . Dyslipidemia   . Erectile dysfunction   . Gait disorder   . Heart murmur   . History of chest pain   . History of headache    chronic, recurrent, postconcussive, after fall from a collapsed deck, had been treated wtih prn fiorinal  . HOH (hard of hearing)   . Hypertension   . Lung cancer ([place])   . Myocardial infarction   . Parkinson's disease    Primarily right sided features  . Parkinson's disease ([place])   . Plantar fasciitis     Past Surgical History:  Procedure Laterality Date  . cardicac stent    . CATARACT EXTRACTION W/PHACO Right 06/28/2014   Procedure: CATARACT EXTRACTION PHACO AND INTRAOCULAR LENS PLACEMENT (IOC);  Surgeon: Lyla Glassing, MD;  Location: [facility] ORS;  Service: Ophthalmology;  Laterality: Right;  [place]    1:07.4               AP     12.6             CDE   8.50  . CORONARY ANGIOPLASTY     stent  . HEMORRHOID SURGERY    . TONSILLECTOMY AND ADENOIDECTOMY      Family History  Problem Relation Age of Onset  . Heart disease Mother   . Heart attack Mother   . Diabetes Mother   . Tremor Mother      Benign essential tremor, but not parkinson's disease  . Prostate cancer Father   . Heart disease Father     Social History:  reports that he quit smoking about 61 years ago. He has never used smokeless tobacco. He reports that he does not drink alcohol or use drugs.  Allergies  Allergen Reactions  . Tramadol Other (See Comments)    Medications: I have reviewed the patient's current medications.  ROS: Unable to obtain as pt is confused.    Physical Examination: Blood pressure (!) 147/79, pulse 71, temperature 97.6 F (36.4 C), temperature source Oral, resp. rate 20, height '6\' 1"'$  (1.854 m), weight 71.4 kg (157 lb 7 oz), SpO2 97 %.    Laboratory Studies:   Basic Metabolic Panel:  Recent Labs Lab 02/19/16 1200 02/20/16 0454  NA 137 137  K 3.4* 3.6  CL 101 103  CO2 29 28  GLUCOSE 120* 171*  BUN 11 13  CREATININE 0.65 0.64  CALCIUM 8.9 8.5*    Liver Function Tests: No results for input(s): AST, ALT, ALKPHOS, BILITOT, PROT, ALBUMIN in the last 168 hours. No results for input(s): LIPASE, AMYLASE in the last 168 hours. No results for input(s): AMMONIA  in the last 168 hours.  CBC:  Recent Labs Lab 02/19/16 1200 02/20/16 0454  WBC 7.8 3.7*  HGB 10.9* 10.9*  HCT 33.0* 32.4*  MCV 92.3 92.2  PLT 218 198    Cardiac Enzymes:  Recent Labs Lab 02/19/16 1200  TROPONINI 0.03*    BNP: Invalid input(s): POCBNP  CBG:  Recent Labs Lab 02/19/16 1829 02/19/16 2233 02/20/16 0755  GLUCAP 171* 213* 154*    Microbiology: No results found for this or any previous visit.  Coagulation Studies: No results for input(s): LABPROT, INR in the last 72 hours.  Urinalysis:  Recent Labs Lab 02/19/16 1425  COLORURINE YELLOW*  LABSPEC 1.020  PHURINE 5.0  GLUCOSEU NEGATIVE  HGBUR NEGATIVE  BILIRUBINUR NEGATIVE  KETONESUR 5*  PROTEINUR 30*  NITRITE NEGATIVE  LEUKOCYTESUR NEGATIVE    Lipid Panel:  No results found for: CHOL, TRIG, HDL, CHOLHDL, VLDL,  LDLCALC  HgbA1C:  Lab Results  Component Value Date   HGBA1C 7.4 (H) 02/27/2015    Urine Drug Screen:  No results found for: LABOPIA, COCAINSCRNUR, LABBENZ, AMPHETMU, THCU, LABBARB  Alcohol Level: No results for input(s): ETH in the last 168 hours.   Imaging: Ct Head Wo Contrast  Result Date: 02/19/2016 CLINICAL DATA:  Metastatic lung cancer.  Syncope versus seizure. EXAM: CT HEAD WITHOUT CONTRAST TECHNIQUE: Contiguous axial images were obtained from the base of the skull through the vertex without intravenous contrast. COMPARISON:  CT head 09/10/2015 FINDINGS: Brain: Moderate atrophy. Negative for hydrocephalus. No shift of the midline structures. Progressive low-density in the cerebral white matter bilaterally compared with the prior study. This is most prominent in the parietal lobes bilaterally where low-density extends to the subcortical white matter in the parietal lobes, left greater than right. This pattern is concerning for metastatic disease. However, no definite mass lesion on unenhanced images. Mild atrophy and chronic microvascular ischemic change in the white matter based on the prior study. Small hyperdensity right parietal cortex unchanged from the prior study most likely calcification. No evidence of hemorrhage. Vascular: No hyperdense vessel or unexpected calcification. Skull: No skull lesion identified. Sinuses/Orbits: Negative Other: None IMPRESSION: Extensive white matter hypodensity bilaterally. This has progressed significantly in the parietal lobes since the prior study of 09/10/2015 and may represent vasogenic edema. This is concerning for metastatic disease and further evaluation is suggested. MRI brain without with contrast is suggested for further evaluation. Less likely are white matter changes related to radiation/ chemotherapy. Small density in the right parietal cortex unchanged from the prior study most likely calcification. Electronically Signed   By: Franchot Gallo  M.D.   On: 02/19/2016 13:54     Assessment/Plan: 83 y.o. male  Lung cancer with metastatic disease diagnosed last September, dementia, diabetes, hypertension, BPH, history of coronary artery disease Parkinson's who presents to the hospital due to multiple episodes of suspected syncope. At baseline pt lives at home with wife and he needs significant assistance.For the past 2 years pt has been having frequent falls and increased yesterday.  Pt has 4 falls yesterday with suspected LOC. No seizure like activity. CTH showed potential vasogenic edema in setting of metastasis.  -  Pt has in home visiting hospice.   - Very likely metastatic dz intracranially from lung mets.     - this could be seizure related due to edema but pt is waking up - don't have EEG today, con't Keppra 500 q12 - Con't decadron 4 q 6.   - pt's wife aware of the poor prognosis/condiditon  specifically if true intracranial metastatic dz 02/20/2016, 11:43 AM   Addendum:  MRI reviewed.  Metastatic intracranial lesions.  Con't decadron and keppra. Hospice follow up

## 2016-02-20 NOTE — Progress Notes (Signed)
Initial Nutrition Assessment  DOCUMENTATION CODES:   Severe malnutrition in context of chronic illness  INTERVENTION:  Ensure Enlive po TID, each supplement provides 350 kcal and 20 grams of protein  Magic cup TID with meals, each supplement provides 290 kcal and 9 grams of protein  snacks  NUTRITION DIAGNOSIS:   Malnutrition related to chronic illness as evidenced by moderate depletion of body fat, severe depletion of muscle mass, 17 percent weight loss in two months.  GOAL:   Patient will meet greater than or equal to 90% of their needs  MONITOR:   PO intake, Supplement acceptance, Weight trends, Skin  REASON FOR ASSESSMENT:   Malnutrition Screening Tool    ASSESSMENT:   83 y.o. male with a known history of Lung cancer with metastatic disease, dementia, diabetes, hypertension, BPH, history of coronary artery disease Parkinson's who presents to the hospital due to multiple episodes of suspected syncope.   Met with pt and son and room today. Pt is poor historian. Obtained history from the son who states that his dad has not been eating well for some time; he will take a few bites of food and than quit eating. Son reports this has gotten much worse over the past 4 weeks. Pt currently eating 50% meals. Pt has top dentures that keep falling out that are making it difficult to eat. Pt's wife is supposed to be bringing [place] today. Per chart, pt has lost 30lbs(17%) in two months. This is severe. Pt with h/o lung Ca with mets who presented for seizures possibly r/t brain mets. Pt to possibly have MRI today. Pt is followed by hospice. Will order supplements and snacks.   Medications reviewed and include: aspirin, dexamethasone, lovenox, insulin, keppra   Labs reviewed: Ca 8.5(L) adj 9.38 wnl, Alb 2.9(L), ALT <5(L) WBC- 3.7(L)  Nutrition-Focused physical exam completed. Findings are moderate fat depletion, severe muscle depletion, and no edema.   Diet Order:  Diet regular Room  service appropriate? Yes; Fluid consistency: Thin  Skin:  Wound (see comment) (Stage I coccyx)  Last BM:  1/16  Height:   Ht Readings from Last 1 Encounters:  02/19/16 6' 1" (1.854 m)    Weight:   Wt Readings from Last 1 Encounters:  02/19/16 157 lb 7 oz (71.4 kg)    Ideal Body Weight:  83.6 kg  BMI:  Body mass index is 20.77 kg/m.  Estimated Nutritional Needs:   Kcal:  2200-2600kcal/day   Protein:  107-121g/day   Fluid:  >2.2L/day  EDUCATION NEEDS:   No education needs identified at this time  Koleen Distance, RD, LDN Pager #[phone]

## 2016-02-20 NOTE — Care Management Important Message (Signed)
Important Message  Patient Details  Name: Craig Dennis. MRN: [id number] Date of Birth: 06/04/1933   Medicare Important Message Given:  Yes    Shelbie Ammons, RN 02/20/2016, 9:59 AM

## 2016-02-20 NOTE — Progress Notes (Signed)
[facility] [place] NAME: Craig Dennis    MR#:  [id number]  DATE OF BIRTH:  January 13, 1934  SUBJECTIVE:  CHIEF COMPLAINT:   Chief Complaint  Patient presents with  . Weakness   The patient is demented, no seizure activity since admission. REVIEW OF SYSTEMS:  Review of Systems  Unable to perform ROS: Dementia    DRUG ALLERGIES:   Allergies  Allergen Reactions  . Tramadol Other (See Comments)   VITALS:  Blood pressure 138/89, pulse 70, temperature 97.7 F (36.5 C), temperature source Oral, resp. rate 18, height '6\' 1"'$  (1.854 m), weight 157 lb 7 oz (71.4 kg), SpO2 98 %. PHYSICAL EXAMINATION:  Physical Exam  Constitutional: No distress.  HENT:  Head: Normocephalic.  Eyes: Conjunctivae and EOM are normal.  Neck: Normal range of motion. Neck supple. No JVD present.  Cardiovascular: Normal rate, regular rhythm and normal heart sounds.   No murmur heard. Pulmonary/Chest: Effort normal and breath sounds normal. No respiratory distress. He has no wheezes. He has no rales.  Abdominal: Soft. Bowel sounds are normal. He exhibits no distension. There is no tenderness.  Musculoskeletal: He exhibits no edema or tenderness.  Neurological: No cranial nerve deficit.  Skin: No rash noted. No erythema.  Psychiatric:  The patient is demented.   LABORATORY PANEL:   CBC  Recent Labs Lab 02/20/16 0454  WBC 3.7*  HGB 10.9*  HCT 32.4*  PLT 198   ------------------------------------------------------------------------------------------------------------------ Chemistries   Recent Labs Lab 02/20/16 0454  NA 137  K 3.6  CL 103  CO2 28  GLUCOSE 171*  BUN 13  CREATININE 0.64  CALCIUM 8.5*   RADIOLOGY:  No results found. ASSESSMENT AND PLAN:   83 year old male with past medical history of lung cancer with metastatic disease, history of Parkinson's disease, hypertension, diabetes, dementia who presented to the hospital due to multiple  episodes of passing out.   1. Syncope- multiple episodes, suspected to be secondary to seizures.  CT head was positive for vasogenic edema and possible evidence of metastatic disease. -Patient has been started on IV Keppra, on seizure precautions.  2. Seizures-this is the cause of patient's multiple passing out episodes. on seizure precautions, CT head positive for vasogenic edema and possible metastatic disease. Follow up MRI of the brain, continue Decadron and IV Keppra per neurologist, Dr. Kerney Elbe.  3. History of lung cancer-patient is followed by Dr. Grayland Ormond. He is currently on [place] -Plan was for her to get a PET scan next week. -Now presenting with possible brain metastases. Follow up oncology consult to discuss prognosis with the patient. The patient has poor prognosis, probably not a candidate for further chemotherapy.  4. History of orthostatic hypotension-continue Florinef. Next  5. Dementia-continue Aricept.  6. Diabetes type 2 without complication-hold metformin. on sliding scale insulin.  7. Hyperlipidemia-continue simvastatin.  8. BPH-continue Flomax.  9. History of Parkinson's disease-continue Sinemet.  All the records are reviewed and case discussed with Care Management/Social Worker. Management plans discussed with the patient, his wife and they are in agreement.  CODE STATUS: DO NOT RESUSCITATE  TOTAL TIME TAKING CARE OF THIS PATIENT: 33 minutes.   More than 50% of the time was spent in counseling/coordination of care: YES  POSSIBLE D/C IN 2 DAYS, DEPENDING ON CLINICAL CONDITION.   Demetrios Loll M.D on 02/20/2016 at 1:44 PM  Between 7am to 6pm - Pager - [phone]  After 6pm go to [email or web address] - [facility]  [facility]  Office  [phone]  CC: Primary care physician; Idelle Crouch, MD  Note: This dictation was prepared with Dragon dictation along with smaller phrase technology. Any  transcriptional errors that result from this process are unintentional.

## 2016-02-21 ENCOUNTER — Ambulatory Visit: Admission: RE | Admit: 2016-02-21 | Payer: Medicare Other | Source: Ambulatory Visit

## 2016-02-21 DIAGNOSIS — E43 Unspecified severe protein-calorie malnutrition: Secondary | ICD-10-CM

## 2016-02-21 DIAGNOSIS — Z794 Long term (current) use of insulin: Secondary | ICD-10-CM

## 2016-02-21 DIAGNOSIS — I2510 Atherosclerotic heart disease of native coronary artery without angina pectoris: Secondary | ICD-10-CM

## 2016-02-21 DIAGNOSIS — G20 Parkinson's disease: Secondary | ICD-10-CM

## 2016-02-21 DIAGNOSIS — N400 Enlarged prostate without lower urinary tract symptoms: Secondary | ICD-10-CM

## 2016-02-21 DIAGNOSIS — G8929 Other chronic pain: Secondary | ICD-10-CM

## 2016-02-21 DIAGNOSIS — E1151 Type 2 diabetes mellitus with diabetic peripheral angiopathy without gangrene: Secondary | ICD-10-CM

## 2016-02-21 DIAGNOSIS — R531 Weakness: Secondary | ICD-10-CM

## 2016-02-21 DIAGNOSIS — Z85828 Personal history of other malignant neoplasm of skin: Secondary | ICD-10-CM

## 2016-02-21 DIAGNOSIS — R55 Syncope and collapse: Secondary | ICD-10-CM

## 2016-02-21 DIAGNOSIS — C7951 Secondary malignant neoplasm of bone: Secondary | ICD-10-CM

## 2016-02-21 DIAGNOSIS — I252 Old myocardial infarction: Secondary | ICD-10-CM

## 2016-02-21 DIAGNOSIS — E119 Type 2 diabetes mellitus without complications: Secondary | ICD-10-CM

## 2016-02-21 DIAGNOSIS — Z79899 Other long term (current) drug therapy: Secondary | ICD-10-CM

## 2016-02-21 DIAGNOSIS — C3431 Malignant neoplasm of lower lobe, right bronchus or lung: Secondary | ICD-10-CM

## 2016-02-21 DIAGNOSIS — N529 Male erectile dysfunction, unspecified: Secondary | ICD-10-CM

## 2016-02-21 DIAGNOSIS — F0280 Dementia in other diseases classified elsewhere without behavioral disturbance: Secondary | ICD-10-CM

## 2016-02-21 DIAGNOSIS — Z8673 Personal history of transient ischemic attack (TIA), and cerebral infarction without residual deficits: Secondary | ICD-10-CM

## 2016-02-21 DIAGNOSIS — R269 Unspecified abnormalities of gait and mobility: Secondary | ICD-10-CM

## 2016-02-21 DIAGNOSIS — C7970 Secondary malignant neoplasm of unspecified adrenal gland: Secondary | ICD-10-CM

## 2016-02-21 DIAGNOSIS — R51 Headache: Secondary | ICD-10-CM

## 2016-02-21 DIAGNOSIS — E785 Hyperlipidemia, unspecified: Secondary | ICD-10-CM

## 2016-02-21 DIAGNOSIS — R5383 Other fatigue: Secondary | ICD-10-CM

## 2016-02-21 DIAGNOSIS — I350 Nonrheumatic aortic (valve) stenosis: Secondary | ICD-10-CM

## 2016-02-21 DIAGNOSIS — Z9221 Personal history of antineoplastic chemotherapy: Secondary | ICD-10-CM

## 2016-02-21 DIAGNOSIS — R609 Edema, unspecified: Secondary | ICD-10-CM

## 2016-02-21 DIAGNOSIS — I10 Essential (primary) hypertension: Secondary | ICD-10-CM

## 2016-02-21 DIAGNOSIS — R634 Abnormal weight loss: Secondary | ICD-10-CM

## 2016-02-21 DIAGNOSIS — Z87891 Personal history of nicotine dependence: Secondary | ICD-10-CM

## 2016-02-21 DIAGNOSIS — Z7982 Long term (current) use of aspirin: Secondary | ICD-10-CM

## 2016-02-21 DIAGNOSIS — R569 Unspecified convulsions: Secondary | ICD-10-CM

## 2016-02-21 DIAGNOSIS — Z86010 Personal history of colonic polyps: Secondary | ICD-10-CM

## 2016-02-21 DIAGNOSIS — Z8042 Family history of malignant neoplasm of prostate: Secondary | ICD-10-CM

## 2016-02-21 DIAGNOSIS — L8990 Pressure ulcer of unspecified site, unspecified stage: Secondary | ICD-10-CM

## 2016-02-21 DIAGNOSIS — C7931 Secondary malignant neoplasm of brain: Principal | ICD-10-CM

## 2016-02-21 LAB — GLUCOSE, CAPILLARY
GLUCOSE-CAPILLARY: 167 mg/dL — AB (ref 65–99)
Glucose-Capillary: 291 mg/dL — ABNORMAL HIGH (ref 65–99)

## 2016-02-21 MED ORDER — DEXAMETHASONE 4 MG PO TABS: 4.0000 mg | ORAL_TABLET | Freq: Four times a day (QID) | ORAL | 0 refills | Status: DC

## 2016-02-21 MED ORDER — LEVETIRACETAM 500 MG PO TABS: 500.0000 mg | ORAL_TABLET | Freq: Two times a day (BID) | ORAL | 0 refills | Status: AC

## 2016-02-21 MED ORDER — DEXAMETHASONE 4 MG PO TABS
4.0000 mg | ORAL_TABLET | Freq: Four times a day (QID) | ORAL | Status: DC
  Administered 2016-02-21: 13:00:00 4 mg via ORAL
  Filled 2016-02-21 (×2): qty 1

## 2016-02-21 MED ORDER — LEVETIRACETAM 500 MG PO TABS
500.0000 mg | ORAL_TABLET | Freq: Two times a day (BID) | ORAL | Status: DC
  Administered 2016-02-21: 13:00:00 500 mg via ORAL
  Filled 2016-02-21: qty 1

## 2016-02-21 MED ORDER — DEXAMETHASONE 4 MG PO TABS: 4.0000 mg | ORAL_TABLET | Freq: Two times a day (BID) | ORAL | 0 refills | Status: AC

## 2016-02-21 NOTE — Progress Notes (Signed)
Visit made. Patient seen lying in bed, son and wife present. Oncologist has rounded and discussed results of the MRI from yesterday. Per patient's wife Craig Dennis they would like to take Craig Dennis home today, they will need a hospital bed and a BSC . They will not be seeking any further treatment for the brain lesions. Write ordered DME to be delivered ASAP, son Craig Dennis to be contact. Plan is for patient to discharge today via car after DME is delivered. He will also discharge with new prescriptions for decadron and Keppra. Hospice team updated. Thank you. Flo Shanks RN, BSN, [facility] [place], hospital Liaison [phone] c

## 2016-02-21 NOTE — Discharge Instructions (Signed)
Aspiration and fall precaution. Hospice care at home

## 2016-02-21 NOTE — Consult Note (Signed)
Reason for Consult:seizure/syncope episodes  Referring Physician: Dr. Bridgett Larsson   CC: syncope/seizure/metastatic disease    No seizure activity today  Past Medical History:  Diagnosis Date  . BPH (benign prostatic hyperplasia)   . CAD (coronary artery disease)   . Cancer ([place])    skin  . Colon polyps   . Dementia   . Diabetes ([place])   . Dyslipidemia   . Erectile dysfunction   . Gait disorder   . Heart murmur   . History of chest pain   . History of headache    chronic, recurrent, postconcussive, after fall from a collapsed deck, had been treated wtih prn fiorinal  . HOH (hard of hearing)   . Hypertension   . Lung cancer ([place])   . Myocardial infarction   . Parkinson's disease    Primarily right sided features  . Parkinson's disease ([place])   . Plantar fasciitis     Past Surgical History:  Procedure Laterality Date  . cardicac stent    . CATARACT EXTRACTION W/PHACO Right 06/28/2014   Procedure: CATARACT EXTRACTION PHACO AND INTRAOCULAR LENS PLACEMENT (IOC);  Surgeon: Lyla Glassing, MD;  Location: [facility] ORS;  Service: Ophthalmology;  Laterality: Right;  [place]    1:07.4               AP     12.6             CDE   8.50  . CORONARY ANGIOPLASTY     stent  . HEMORRHOID SURGERY    . TONSILLECTOMY AND ADENOIDECTOMY      Family History  Problem Relation Age of Onset  . Heart disease Mother   . Heart attack Mother   . Diabetes Mother   . Tremor Mother     Benign essential tremor, but not parkinson's disease  . Prostate cancer Father   . Heart disease Father     Social History:  reports that he quit smoking about 61 years ago. He has never used smokeless tobacco. He reports that he does not drink alcohol or use drugs.  Allergies  Allergen Reactions  . Tramadol Other (See Comments)    Medications: I have reviewed the patient's current medications.  ROS: Unable to obtain as pt is confused.    Physical Examination: Blood pressure (!) 165/82, pulse 71, temperature 97.8 F  (36.6 C), temperature source Oral, resp. rate 20, height '6\' 1"'$  (1.854 m), weight 71.4 kg (157 lb 7 oz), SpO2 98 %.  Neurological Examination Mental Status: Alert, oriented, thought content appropriate.  Speech fluent without evidence of aphasia.  Able to follow 3 step commands without difficulty. Cranial Nerves: II: Discs flat bilaterally; Visual fields grossly normal, pupils equal, round, reactive to light and accommodation III,IV, VI: ptosis not present, extra-ocular motions intact bilaterally V,VII: smile symmetric, facial light touch sensation normal bilaterally VIII: hearing normal bilaterally IX,X: gag reflex present XI: bilateral shoulder shrug XII: midline tongue extension Motor: Right : Upper extremity   3/5    Left:     Upper extremity   3/5  Lower extremity   3/5     Lower extremity   3/5 Tone and bulk:normal tone throughout; no atrophy noted Sensory: Pinprick and light touch intact throughout, bilaterally Deep Tendon Reflexes:1+ and symmetric throughout Plantars: Right: downgoing   Left: downgoing Cerebellar: normal finger-to-nose, normal rapid alternating movements and normal heel-to-shin test Gait: not tested       Laboratory Studies:   Basic Metabolic Panel:  Recent Labs Lab  02/19/16 1200 02/20/16 0454  NA 137 137  K 3.4* 3.6  CL 101 103  CO2 29 28  GLUCOSE 120* 171*  BUN 11 13  CREATININE 0.65 0.64  CALCIUM 8.9 8.5*    Liver Function Tests: No results for input(s): AST, ALT, ALKPHOS, BILITOT, PROT, ALBUMIN in the last 168 hours. No results for input(s): LIPASE, AMYLASE in the last 168 hours. No results for input(s): AMMONIA in the last 168 hours.  CBC:  Recent Labs Lab 02/19/16 1200 02/20/16 0454  WBC 7.8 3.7*  HGB 10.9* 10.9*  HCT 33.0* 32.4*  MCV 92.3 92.2  PLT 218 198    Cardiac Enzymes:  Recent Labs Lab 02/19/16 1200  TROPONINI 0.03*    BNP: Invalid input(s): POCBNP  CBG:  Recent Labs Lab 02/20/16 1659 02/20/16 2043  02/20/16 2245 02/21/16 0853 02/21/16 1246  GLUCAP 250* 228* 196* 167* 291*    Microbiology: No results found for this or any previous visit.  Coagulation Studies: No results for input(s): LABPROT, INR in the last 72 hours.  Urinalysis:   Recent Labs Lab 02/19/16 1425  COLORURINE YELLOW*  LABSPEC 1.020  PHURINE 5.0  GLUCOSEU NEGATIVE  HGBUR NEGATIVE  BILIRUBINUR NEGATIVE  KETONESUR 5*  PROTEINUR 30*  NITRITE NEGATIVE  LEUKOCYTESUR NEGATIVE    Lipid Panel:  No results found for: CHOL, TRIG, HDL, CHOLHDL, VLDL, LDLCALC  HgbA1C:  Lab Results  Component Value Date   HGBA1C 7.4 (H) 02/27/2015    Urine Drug Screen:  No results found for: LABOPIA, COCAINSCRNUR, LABBENZ, AMPHETMU, THCU, LABBARB  Alcohol Level: No results for input(s): ETH in the last 168 hours.   Imaging: Ct Head Wo Contrast  Result Date: 02/19/2016 CLINICAL DATA:  Metastatic lung cancer.  Syncope versus seizure. EXAM: CT HEAD WITHOUT CONTRAST TECHNIQUE: Contiguous axial images were obtained from the base of the skull through the vertex without intravenous contrast. COMPARISON:  CT head 09/10/2015 FINDINGS: Brain: Moderate atrophy. Negative for hydrocephalus. No shift of the midline structures. Progressive low-density in the cerebral white matter bilaterally compared with the prior study. This is most prominent in the parietal lobes bilaterally where low-density extends to the subcortical white matter in the parietal lobes, left greater than right. This pattern is concerning for metastatic disease. However, no definite mass lesion on unenhanced images. Mild atrophy and chronic microvascular ischemic change in the white matter based on the prior study. Small hyperdensity right parietal cortex unchanged from the prior study most likely calcification. No evidence of hemorrhage. Vascular: No hyperdense vessel or unexpected calcification. Skull: No skull lesion identified. Sinuses/Orbits: Negative Other: None  IMPRESSION: Extensive white matter hypodensity bilaterally. This has progressed significantly in the parietal lobes since the prior study of 09/10/2015 and may represent vasogenic edema. This is concerning for metastatic disease and further evaluation is suggested. MRI brain without with contrast is suggested for further evaluation. Less likely are white matter changes related to radiation/ chemotherapy. Small density in the right parietal cortex unchanged from the prior study most likely calcification. Electronically Signed   By: Franchot Gallo M.D.   On: 02/19/2016 13:54   Mr Jeri Cos YY Contrast  Result Date: 02/20/2016 CLINICAL DATA:  Multiple episodes of syncope versus seizure. History of lung cancer with concern for brain metastases on CT. EXAM: MRI HEAD WITHOUT AND WITH CONTRAST TECHNIQUE: Multiplanar, multiecho pulse sequences of the brain and surrounding structures were obtained without and with intravenous contrast. CONTRAST:  64m MULTIHANCE GADOBENATE DIMEGLUMINE 529 MG/ML IV SOLN COMPARISON:  Head CT  02/19/2016 FINDINGS: Brain: There is no evidence of acute infarct or extra-axial fluid collection. There is mild-to-moderate cerebral atrophy. There is a 1.6 x 1.3 cm enhancing lesion in the high posterior left frontal lobe with small amount of associated chronic blood products. A 1.1 x 0.9 cm enhancing lesion is present in the anterior right parietal lobe. There is moderate vasogenic edema associated with both lesions resulting in mild regional sulcal effacement but no midline shift. Patchy to confluent T2 hyperintensities elsewhere in the cerebral white matter bilaterally and in the pons are nonspecific but compatible with moderate chronic small vessel ischemic disease. There is a chronic lacunar infarct involving the body of the left caudate nucleus. Mesial temporal lobe structures are symmetric. Vascular: Major intracranial vascular flow voids are preserved. Skull and upper cervical spine: No  suspicious osseous lesion. Sinuses/Orbits: Prior right cataract extraction. Paranasal sinuses and mastoid air cells are clear. Other: None. IMPRESSION: Right parietal and left frontal lobe brain lesions consistent with metastases. Moderate vasogenic edema without midline shift. Electronically Signed   By: Logan Bores M.D.   On: 02/20/2016 14:17     Assessment/Plan: 83 y.o. male  Lung cancer with metastatic disease diagnosed last September, dementia, diabetes, hypertension, BPH, history of coronary artery disease Parkinson's who presents to the hospital due to multiple episodes of suspected syncope. At baseline pt lives at home with wife and he needs significant assistance.For the past 2 years pt has been having frequent falls and increased yesterday.  Pt has 4 falls yesterday with suspected LOC. No seizure like activity. CTH showed potential vasogenic edema in setting of metastasis.   Discussed with pt's wife today. She is in agreement with hospice in setting of new metastasis. This is very poor prognosis - D/c planning with hospice.   - Con't Keppra and decadron at home - call with questions.

## 2016-02-21 NOTE — Consult Note (Signed)
Hematology/Oncology Consult note [facility] [phone] [phone]  Patient Care Team: Idelle Crouch, MD as PCP - General (Internal Medicine) Flora Lipps, MD as Consulting Physician (Pulmonary Disease)   Name of the patient: Craig Dennis  [id number]  1933/08/21    Reason for referral- metastatic lung cancer   Referring physician- Dr. Bridgett Larsson  Date of visit: 02/21/2016    History of presenting illness- patient is a 83 year old male with multiple medical problems including stage IV adenocarcinoma of the lung, Parkinson's disease, dementia, diabetes and history of coronary artery disease among other medical problems. He was initially diagnosed with stage IV adenocarcinoma of the lung in October 2017 and was last seen by Dr. Grayland Ormond on 01/28/2016. He is known to have adrenal and bony metastases. Cycle #3 of Beryle Flock was given on 01/28/2016. However his performance status was borderline at that time and options for supportive care were discussed and patient wanted to proceed aggressive treatment back then. Patient was also briefly on hospice for 2 weeks which was on hold since the plan was to continue chemotherapy. He was admitted to the hospital on 02/19/2016 for suspected syncope. He had a CT head and the ER which showed vasogenic edema and evidence of brain metastases. Given his multiple comorbidities, underlying stage IV lung cancer and declining performance status; Dr. Bridgett Larsson spoke to patient and family and plan is to go home with hospice.  I met with patient and his family at the bedside today. Patient appeared to be in good spirits and smiling. He finished his breakfast this morning. He denies any discomfort at present time.  ECOG PS- 3  Pain scale- 0   Review of systems- Review of Systems  Constitutional: Positive for malaise/fatigue and weight loss. Negative for chills and fever.  HENT: Negative for congestion, ear discharge and  nosebleeds.   Eyes: Negative for blurred vision.  Respiratory: Negative for cough, hemoptysis, sputum production, shortness of breath and wheezing.   Cardiovascular: Negative for chest pain, palpitations, orthopnea and claudication.  Gastrointestinal: Negative for abdominal pain, blood in stool, constipation, diarrhea, heartburn, melena, nausea and vomiting.  Genitourinary: Negative for dysuria, flank pain, frequency, hematuria and urgency.  Musculoskeletal: Negative for back pain, joint pain and myalgias.  Skin: Negative for rash.  Neurological: Positive for weakness. Negative for dizziness, tingling, focal weakness and headaches.  Endo/Heme/Allergies: Does not bruise/bleed easily.  Psychiatric/Behavioral: Negative for depression and suicidal ideas. The patient does not have insomnia.     Allergies  Allergen Reactions  . Tramadol Other (See Comments)    Patient Active Problem List   Diagnosis Date Noted  . Protein-calorie malnutrition, severe 02/21/2016  . Seizures ([place]) 02/19/2016  . Pressure injury of skin 02/19/2016  . Arteriosclerotic cardiovascular disease 12/09/2015  . Benign essential hypertension 12/09/2015  . Dementia due to Parkinson's disease without behavioral disturbance ([place]) 10/31/2015  . Primary cancer of right lower lobe of lung ([place]) 10/24/2015  . TIA (transient ischemic attack) 02/27/2015  . MI (myocardial infarction) 08/01/2013  . ED (erectile dysfunction) 07/15/2013  . Erythematous condition 06/05/2013  . Aortic stenosis 05/08/2013  . CAD S/P percutaneous coronary angioplasty 05/03/2013  . HTN (hypertension) 05/03/2013  . Type 2 diabetes mellitus with vascular disease ([place]) 05/03/2013  . Other and unspecified hyperlipidemia 05/03/2013  . Parkinson disease ([place]) 05/03/2013  . Chronic headache 05/03/2013  . CAD (coronary artery disease), native coronary artery 05/03/2013  . Headache 05/03/2013  . Type 2 diabetes mellitus with diabetic peripheral angiopathy  without  gangrene ([place]) 05/03/2013  . Abnormality of gait 03/21/2012  . Paralysis agitans ([place]) 03/21/2012     Past Medical History:  Diagnosis Date  . BPH (benign prostatic hyperplasia)   . CAD (coronary artery disease)   . Cancer ([place])    skin  . Colon polyps   . Dementia   . Diabetes ([place])   . Dyslipidemia   . Erectile dysfunction   . Gait disorder   . Heart murmur   . History of chest pain   . History of headache    chronic, recurrent, postconcussive, after fall from a collapsed deck, had been treated wtih prn fiorinal  . HOH (hard of hearing)   . Hypertension   . Lung cancer ([place])   . Myocardial infarction   . Parkinson's disease    Primarily right sided features  . Parkinson's disease ([place])   . Plantar fasciitis      Past Surgical History:  Procedure Laterality Date  . cardicac stent    . CATARACT EXTRACTION W/PHACO Right 06/28/2014   Procedure: CATARACT EXTRACTION PHACO AND INTRAOCULAR LENS PLACEMENT (IOC);  Surgeon: Lyla Glassing, MD;  Location: [facility] ORS;  Service: Ophthalmology;  Laterality: Right;  [place]    1:07.4               AP     12.6             CDE   8.50  . CORONARY ANGIOPLASTY     stent  . HEMORRHOID SURGERY    . TONSILLECTOMY AND ADENOIDECTOMY      Social History   Social History  . Marital status: Married    Spouse name: N/A  . Number of children: 2  . Years of education: 16   Occupational History  . Retired    Social History Main Topics  . Smoking status: Former Smoker    Quit date: 02/03/1955  . Smokeless tobacco: Never Used  . Alcohol use No  . Drug use: No  . Sexual activity: Not Currently   Other Topics Concern  . 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 Heart disease Mother   . Heart attack Mother   . Diabetes Mother   . Tremor Mother     Benign essential tremor, but  not parkinson's disease  . Prostate cancer Father   . Heart disease Father      Current Facility-Administered Medications:  .  0.9 %  sodium chloride infusion, , Intravenous, Continuous, Henreitta Leber, MD, Last Rate: 75 mL/hr at 02/21/16 0705 .  acetaminophen (TYLENOL) tablet 650 mg, 650 mg, Oral, Q6H PRN **OR** acetaminophen (TYLENOL) suppository 650 mg, 650 mg, Rectal, Q6H PRN, Henreitta Leber, MD .  aspirin EC tablet 81 mg, 81 mg, Oral, Daily, Henreitta Leber, MD, 81 mg at 02/21/16 0908 .  carbidopa-levodopa (SINEMET CR) 50-200 MG per tablet controlled release 3 tablet, 3 tablet, Oral, BH-q7a, 3 tablet at 02/21/16 0908 **AND** carbidopa-levodopa (SINEMET CR) 50-200 MG per tablet controlled release 2 tablet, 2 tablet, Oral, Daily, 2 tablet at 02/20/16 1350 **AND** carbidopa-levodopa (SINEMET CR) 50-200 MG per tablet controlled release 2 tablet, 2 tablet, Oral, QPM, Henreitta Leber, MD, 2 tablet at 02/20/16 1739 .  dexamethasone (DECADRON) injection 4 mg, 4 mg, Intravenous, Q6H,  Henreitta Leber, MD, 4 mg at 02/21/16 0636 .  donepezil (ARICEPT) tablet 5 mg, 5 mg, Oral, Daily, Henreitta Leber, MD, 5 mg at 02/21/16 0908 .  enoxaparin (LOVENOX) injection 40 mg, 40 mg, Subcutaneous, Q24H, Henreitta Leber, MD, 40 mg at 02/20/16 2248 .  feeding supplement (ENSURE ENLIVE) (ENSURE ENLIVE) liquid 237 mL, 237 mL, Oral, TID BM, Demetrios Loll, MD, 237 mL at 02/21/16 0909 .  fludrocortisone (FLORINEF) tablet 0.1 mg, 0.1 mg, Oral, Daily, Henreitta Leber, MD, 0.1 mg at 02/21/16 0908 .  insulin aspart (novoLOG) injection 0-5 Units, 0-5 Units, Subcutaneous, QHS, Henreitta Leber, MD, 2 Units at 02/19/16 2251 .  insulin aspart (novoLOG) injection 0-9 Units, 0-9 Units, Subcutaneous, TID WC, Henreitta Leber, MD, 2 Units at 02/21/16 0909 .  levETIRAcetam (KEPPRA) tablet 500 mg, 500 mg, Oral, BID, Demetrios Loll, MD .  ondansetron [facility]) tablet 4 mg, 4 mg, Oral, Q6H PRN **OR** ondansetron (ZOFRAN) injection 4 mg, 4 mg,  Intravenous, Q6H PRN, Henreitta Leber, MD .  rasagiline (AZILECT) tablet 1 mg, 1 mg, Oral, QHS, Henreitta Leber, MD, 1 mg at 02/20/16 2247 .  simvastatin (ZOCOR) tablet 40 mg, 40 mg, Oral, QHS, Henreitta Leber, MD, 40 mg at 02/20/16 2247 .  tamsulosin (FLOMAX) capsule 0.8 mg, 0.8 mg, Oral, Q1200, Henreitta Leber, MD, 0.8 mg at 02/20/16 1359  Facility-Administered Medications Ordered in Other Encounters:  .  0.9 %  sodium chloride infusion, , Intravenous, Once, Lloyd Huger, MD   Physical exam:  Vitals:   02/20/16 0509 02/20/16 1224 02/20/16 2041 02/21/16 0502  BP: (!) 147/79 138/89 (!) 161/78 (!) 165/82  Pulse: 71 70 68 71  Resp: _0 Temp: 97.6 F (36.4 C) 97.7 F (36.5 C) 97.6 F (36.4 C) 97.8 F (36.6 C)  TempSrc: Oral Oral Oral Oral  SpO2: 97% 98% 99% 98%  Weight:      Height:       Physical Exam  Constitutional: He is oriented to person, place, and time.  Appears fatigued but in no acute distress.  HENT:  Head: Normocephalic and atraumatic.  Eyes: EOM are normal. Pupils are equal, round, and reactive to light.  Neck: Normal range of motion.  Cardiovascular: Normal rate and regular rhythm.   Murmur (Systolic murmur in the aortic area) heard. Pulmonary/Chest: Effort normal and breath sounds normal.  Abdominal: Soft. Bowel sounds are normal.  Neurological: He is alert and oriented to person, place, and time.  His speech is somewhat garbled and difficult to understand  Skin: Skin is warm and dry.       CMP Latest Ref Rng & Units 02/20/2016  Glucose 65 - 99 mg/dL 171(H)  BUN 6 - 20 mg/dL 13  Creatinine 0.61 - 1.24 mg/dL 0.64  Sodium 135 - 145 mmol/L 137  Potassium 3.5 - 5.1 mmol/L 3.6  Chloride 101 - 111 mmol/L 103  CO2 22 - 32 mmol/L 28  Calcium 8.9 - 10.3 mg/dL 8.5(L)  Total Protein 6.5 - 8.1 g/dL -  Total Bilirubin 0.3 - 1.2 mg/dL -  Alkaline Phos 38 - 126 U/L -  AST 15 - 41 U/L -  ALT 17 - 63 U/L -   CBC Latest Ref Rng & Units 02/20/2016  WBC  3.8 - 10.6 K/uL 3.7(L)  Hemoglobin 13.0 - 18.0 g/dL 10.9(L)  Hematocrit 40.0 - 52.0 % 32.4(L)  Platelets 150 - 440 K/uL 198    _1 @  Ct Head  Wo Contrast  Result Date: 02/19/2016 CLINICAL DATA:  Metastatic lung cancer.  Syncope versus seizure. EXAM: CT HEAD WITHOUT CONTRAST TECHNIQUE: Contiguous axial images were obtained from the base of the skull through the vertex without intravenous contrast. COMPARISON:  CT head 09/10/2015 FINDINGS: Brain: Moderate atrophy. Negative for hydrocephalus. No shift of the midline structures. Progressive low-density in the cerebral white matter bilaterally compared with the prior study. This is most prominent in the parietal lobes bilaterally where low-density extends to the subcortical white matter in the parietal lobes, left greater than right. This pattern is concerning for metastatic disease. However, no definite mass lesion on unenhanced images. Mild atrophy and chronic microvascular ischemic change in the white matter based on the prior study. Small hyperdensity right parietal cortex unchanged from the prior study most likely calcification. No evidence of hemorrhage. Vascular: No hyperdense vessel or unexpected calcification. Skull: No skull lesion identified. Sinuses/Orbits: Negative Other: None IMPRESSION: Extensive white matter hypodensity bilaterally. This has progressed significantly in the parietal lobes since the prior study of 09/10/2015 and may represent vasogenic edema. This is concerning for metastatic disease and further evaluation is suggested. MRI brain without with contrast is suggested for further evaluation. Less likely are white matter changes related to radiation/ chemotherapy. Small density in the right parietal cortex unchanged from the prior study most likely calcification. Electronically Signed   By: Franchot Gallo M.D.   On: 02/19/2016 13:54   Mr Jeri Cos NL Contrast  Result Date: 02/20/2016 CLINICAL DATA:  Multiple episodes of syncope  versus seizure. History of lung cancer with concern for brain metastases on CT. EXAM: MRI HEAD WITHOUT AND WITH CONTRAST TECHNIQUE: Multiplanar, multiecho pulse sequences of the brain and surrounding structures were obtained without and with intravenous contrast. CONTRAST:  88m MULTIHANCE GADOBENATE DIMEGLUMINE 529 MG/ML IV SOLN COMPARISON:  Head CT 02/19/2016 FINDINGS: Brain: There is no evidence of acute infarct or extra-axial fluid collection. There is mild-to-moderate cerebral atrophy. There is a 1.6 x 1.3 cm enhancing lesion in the high posterior left frontal lobe with small amount of associated chronic blood products. A 1.1 x 0.9 cm enhancing lesion is present in the anterior right parietal lobe. There is moderate vasogenic edema associated with both lesions resulting in mild regional sulcal effacement but no midline shift. Patchy to confluent T2 hyperintensities elsewhere in the cerebral white matter bilaterally and in the pons are nonspecific but compatible with moderate chronic small vessel ischemic disease. There is a chronic lacunar infarct involving the body of the left caudate nucleus. Mesial temporal lobe structures are symmetric. Vascular: Major intracranial vascular flow voids are preserved. Skull and upper cervical spine: No suspicious osseous lesion. Sinuses/Orbits: Prior right cataract extraction. Paranasal sinuses and mastoid air cells are clear. Other: None. IMPRESSION: Right parietal and left frontal lobe brain lesions consistent with metastases. Moderate vasogenic edema without midline shift. Electronically Signed   By: ALogan BoresM.D.   On: 02/20/2016 14:17    Assessment and plan- Patient is a 83y.o. male with a history of Parkinson's dementia and stage IV adenocarcinoma of the lung now admitted for syncope/seizures and found to have brain metastases  1. I spoke with Dr. FGrayland Ormondwho is patient's oncologist over the phone today since he is not available to be with the patient today.  He is in agreement with the hospice care plan. I also met with patient's wife and son at the bedside today. Patient's wife is willing to care for him at home and would like to  take him home once hospice bed becomes available. He is a DO NOT RESUSCITATE. I would recommend continuing steroids at least 4 mg twice a day given vasogenic edema noted on Ct. I would recommend getting input from pharmacy if they could streamline his medications and stop unwanted medications as the goal presently is to focus on his comfort. Given the new metastases and declining performance status is overall prognosis is likely less than 3 months.  I appreciate discussions that Dr. Bridgett Larsson has had with the patient and family to facilititate home going with hospice. Please call us with any questions or concerns   Thank you for this kind referral and the opportunity to participate in the care of this patient   Visit Diagnosis 1. Syncope, unspecified syncope type   2. Brain metastasis ([place])   3. Seizure ([place])     Dr. Randa Evens, MD, MPH [place] [facility] Pager- [phone] 02/21/2016

## 2016-02-21 NOTE — Care Management Note (Signed)
Case Management Note  Patient Details  Name: Craig Dennis. MRN: [id number] Date of Birth: 1934/01/22  Subjective/Objective:    Santiago Glad with [facility] notified of discharge.                 Action/Plan:   Expected Discharge Date:  02/21/16               Expected Discharge Plan:  Home w Hospice Care  In-House Referral:     Discharge planning Services     Post Acute Care Choice:    Choice offered to:     DME Arranged:    DME Agency:     HH Arranged:    [place] Agency:     Status of Service:  Completed, signed off  If discussed at [employer] of Stay Meetings, dates discussed:    Additional Comments:  Jolly Mango, RN 02/21/2016, 11:31 AM

## 2016-02-21 NOTE — Progress Notes (Signed)
Discharge instructions given and went over with patients wife at bedside. Prescriptions given and reviewed. All questions answered. Patient discharged home with hospice services resumed and wife via wheelchair by nursing staff. Madlyn Frankel, RN

## 2016-02-21 NOTE — Discharge Summary (Addendum)
[facility] NAME: Craig Dennis    MR#:  [id number]  DATE OF BIRTH:  04-03-1933  DATE OF ADMISSION:  02/19/2016   ADMITTING PHYSICIAN: Henreitta Leber, MD  DATE OF DISCHARGE: 02/21/2016 PRIMARY CARE PHYSICIAN: SPARKS,JEFFREY D, MD   ADMISSION DIAGNOSIS:  Brain metastasis (HCC) [C79.31] Syncope, unspecified syncope type [R55] DISCHARGE DIAGNOSIS:  Active Problems:   Seizures ([place])   Pressure injury of skin   Protein-calorie malnutrition, severe   Brain metastasis ([place])  SECONDARY DIAGNOSIS:   Past Medical History:  Diagnosis Date  . BPH (benign prostatic hyperplasia)   . CAD (coronary artery disease)   . Cancer ([place])    skin  . Colon polyps   . Dementia   . Diabetes ([place])   . Dyslipidemia   . Erectile dysfunction   . Gait disorder   . Heart murmur   . History of chest pain   . History of headache    chronic, recurrent, postconcussive, after fall from a collapsed deck, had been treated wtih prn fiorinal  . HOH (hard of hearing)   . Hypertension   . Lung cancer ([place])   . Myocardial infarction   . Parkinson's disease    Primarily right sided features  . Parkinson's disease ([place])   . Plantar fasciitis    HOSPITAL COURSE:   83 year old male with past medical history of lung cancer with metastatic disease, history of Parkinson's disease, hypertension, diabetes, dementia who presented to the hospital due to multiple episodes of passing out.   1. Syncope secondary to seizures. CT head was positive for vasogenic edema and possible evidence of metastatic disease. -Patient has been started on IV Keppra, on seizure precautions.  2. Seizures-this is the cause of patient's multiple passing out episodes. on seizure precautions, CT head positive for vasogenic edema and possible metastatic disease. Metastasis per MRI of the brain, continue Decadron and IV Keppra per neurologist, Dr. Kerney Elbe.  3. History of lung  cancer-patient is followed by Dr. Grayland Ormond. He is currently on [place] -Plan was for her to get a PET scan next week. -Now presenting with possible brain metastases. Per oncology consult, given the new metastases and declining performance status is overall prognosis is likely less than 3 months. The patient has poor prognosis, not a candidate for further chemotherapy. He needs hospice care at home.  4. History of orthostatic hypotension-on Florinef.  5. Dementia-continue Aricept.  6. Diabetes type 2 without complication-hold metformin. on sliding scale insulin.  7. Hyperlipidemia-continue simvastatin.  8. BPH-continue Flomax.  9. History of Parkinson's disease-continue Sinemet.  DISCHARGE CONDITIONS:  Poor prognosis, discharge to home with hospice care. CONSULTS OBTAINED:  Treatment Team:  Alexis Goodell, MD Leotis Pain, MD Sindy Guadeloupe, MD Cammie Sickle, MD DRUG ALLERGIES:   Allergies  Allergen Reactions  . Tramadol Other (See Comments)   DISCHARGE MEDICATIONS:   Allergies as of 02/21/2016      Reactions   Tramadol Other (See Comments)      Medication List    STOP taking these medications   fludrocortisone 0.1 MG tablet Commonly known as:  FLORINEF     TAKE these medications   aspirin EC 81 MG tablet Take 81 mg by mouth daily.   AZILECT 1 MG Tabs tablet Generic drug:  rasagiline Take 1 mg by mouth at bedtime.   carbidopa-levodopa 50-200 MG tablet Commonly known as:  SINEMET CR Take 7 tablets by mouth daily. Pt takes 3  tabs at 7am, 2 tablets at 12 pm, and 2 tabs at 4pm.   dexamethasone 4 MG tablet Commonly known as:  DECADRON Take 1 tablet (4 mg total) by mouth 2 (two) times daily.   donepezil 5 MG tablet Commonly known as:  ARICEPT Take 5 mg by mouth daily.   levETIRAcetam 500 MG tablet Commonly known as:  KEPPRA Take 1 tablet (500 mg total) by mouth 2 (two) times daily.   megestrol 40 MG tablet Commonly known as:   MEGACE Take 1 tablet (40 mg total) by mouth daily.   metFORMIN 1000 MG tablet Commonly known as:  GLUCOPHAGE Take 1,000 mg by mouth 2 (two) times daily.   potassium chloride SA 20 MEQ tablet Commonly known as:  K-DUR,KLOR-CON Take 2 tablets (40 mEq total) by mouth 2 (two) times daily.   prochlorperazine 10 MG tablet Commonly known as:  COMPAZINE Take 1 tablet (10 mg total) by mouth every 6 (six) hours as needed (Nausea or vomiting).   simvastatin 40 MG tablet Commonly known as:  ZOCOR Take 40 mg by mouth at bedtime.   tamsulosin 0.4 MG Caps capsule Commonly known as:  FLOMAX Take 0.8 mg by mouth daily at 12 noon. At 12 noon.        DISCHARGE INSTRUCTIONS:  See AVS.  If you experience worsening of your admission symptoms, develop shortness of breath, life threatening emergency, suicidal or homicidal thoughts you must seek medical attention immediately by calling 911 or calling your MD immediately  if symptoms less severe.  You Must read complete instructions/literature along with all the possible adverse reactions/side effects for all the Medicines you take and that have been prescribed to you. Take any new Medicines after you have completely understood and accpet all the possible adverse reactions/side effects.   Please note  You were cared for by a hospitalist during your hospital stay. If you have any questions about your discharge medications or the care you received while you were in the hospital after you are discharged, you can call the unit and asked to speak with the hospitalist on call if the hospitalist that took care of you is not available. Once you are discharged, your primary care physician will handle any further medical issues. Please note that NO REFILLS for any discharge medications will be authorized once you are discharged, as it is imperative that you return to your primary care physician (or establish a relationship with a primary care physician if you do not  have one) for your aftercare needs so that they can reassess your need for medications and monitor your lab values.    On the day of Discharge:  VITAL SIGNS:  Blood pressure (!) 165/82, pulse 71, temperature 97.8 F (36.6 C), temperature source Oral, resp. rate 20, height '6\' 1"'$  (1.854 m), weight 157 lb 7 oz (71.4 kg), SpO2 98 %. PHYSICAL EXAMINATION:  GENERAL:  83 y.o.-year-old patient lying in the bed with no acute distress. Malnutrition. EYES: Pupils equal, round, reactive to light and accommodation. No scleral icterus. Extraocular muscles intact.  HEENT: Head atraumatic, normocephalic.  NECK:  Supple, no jugular venous distention. No thyroid enlargement, no tenderness.  LUNGS: Normal breath sounds bilaterally, no wheezing, rales,rhonchi or crepitation. No use of accessory muscles of respiration.  CARDIOVASCULAR: S1, S2 normal. No murmurs, rubs, or gallops.  ABDOMEN: Soft, non-tender, non-distended. Bowel sounds present. No organomegaly or mass.  EXTREMITIES: No pedal edema, cyanosis, or clubbing.  NEUROLOGIC: Unable to exam. PSYCHIATRIC: The patient is Demented.  SKIN: No obvious rash, lesion, or ulcer.  DATA REVIEW:   CBC  Recent Labs Lab 02/20/16 0454  WBC 3.7*  HGB 10.9*  HCT 32.4*  PLT 198    Chemistries   Recent Labs Lab 02/20/16 0454  NA 137  K 3.6  CL 103  CO2 28  GLUCOSE 171*  BUN 13  CREATININE 0.64  CALCIUM 8.5*     Microbiology Results  No results found for this or any previous visit.  RADIOLOGY:  No results found.   Management plans discussed with the patient, His wife and they are in agreement.  CODE STATUS:     Code Status Orders        Start     Ordered   02/19/16 1805  Do not attempt resuscitation (DNR)  Continuous    Question Answer Comment  In the event of cardiac or respiratory ARREST Do not call a "code blue"   In the event of cardiac or respiratory ARREST Do not perform Intubation, CPR, defibrillation or ACLS   In the event  of cardiac or respiratory ARREST Use medication by any route, position, wound care, and other measures to relive pain and suffering. May use oxygen, suction and manual treatment of airway obstruction as needed for comfort.      02/19/16 1804    Code Status History    Date Active Date Inactive Code Status Order ID Comments User Context   02/27/2015  2:19 PM 02/28/2015  5:11 PM Full Code [id number]  Dustin Flock, MD ED    Advance Directive Documentation   Flowsheet Row Most Recent Value  Type of Advance Directive  Healthcare Power of Attorney  Pre-existing out of facility DNR order (yellow form or pink MOST form)  Yellow form placed in chart (order not valid for inpatient use)  "MOST" Form in Place?  No data      TOTAL TIME TAKING CARE OF THIS PATIENT: 36 minutes.    Demetrios Loll M.D on 02/21/2016 at 2:40 PM  Between 7am to 6pm - Pager - [phone]  After 6pm go to [email or web address] - Proofreader  [facility]  Office  [phone]  CC: Primary care physician; Idelle Crouch, MD   Note: This dictation was prepared with Dragon dictation along with smaller phrase technology. Any transcriptional errors that result from this process are unintentional.

## 2016-02-25 ENCOUNTER — Other Ambulatory Visit: Payer: Medicare Other

## 2016-02-25 ENCOUNTER — Inpatient Hospital Stay: Payer: Medicare Other

## 2016-02-25 ENCOUNTER — Inpatient Hospital Stay: Payer: Medicare Other | Admitting: Oncology

## 2016-05-03 DEATH — deceased

## 2018-06-04 IMAGING — CT CT CHEST W/ CM
2 of 3 series · 15 of 36 positions shown, 18 images · IV contrast (iopamidol)
Comparison: Radiographs 07/15/2013 and 09/10/2015.  CT 09/20/2015.

CLINICAL DATA: Follow-up necrotic right lower lobe mass versus
cavitary pneumonia. Former smoker. Patient denies new chest
complaints.

EXAM:
CT CHEST WITH CONTRAST
TECHNIQUE: Multidetector CT imaging of the chest was performed during
intravenous contrast administration.
CONTRAST:  75mL L8WI6O-W00 IOPAMIDOL (L8WI6O-W00) INJECTION 61%

[Series 2: axial st · axial · 0.76mm/px · z∈[-610,-318]mm · 12 of 172 slices shown, 15 images]
[im 13/172  mediastinal]
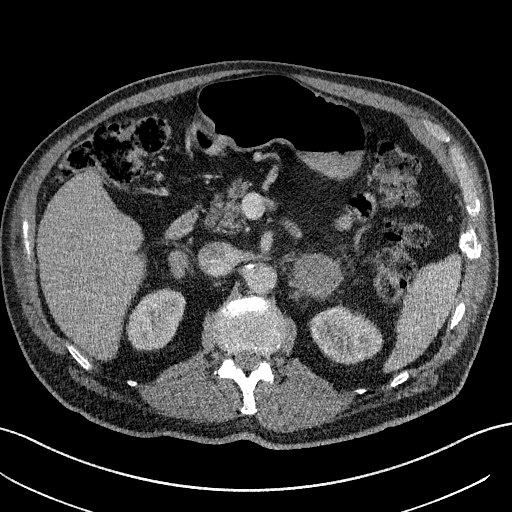
[im 13/172  lung]
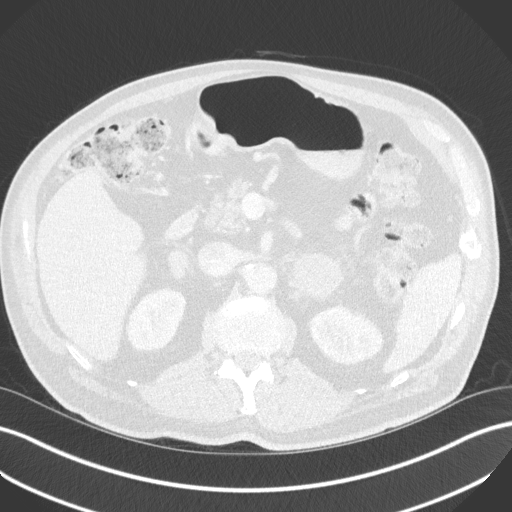
[im 26/172  lung]
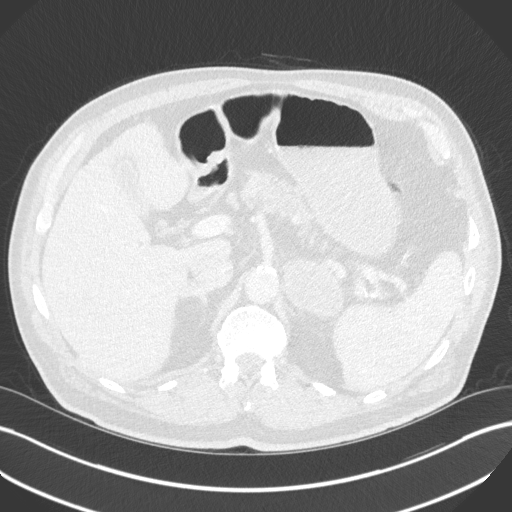
[im 39/172  lung]
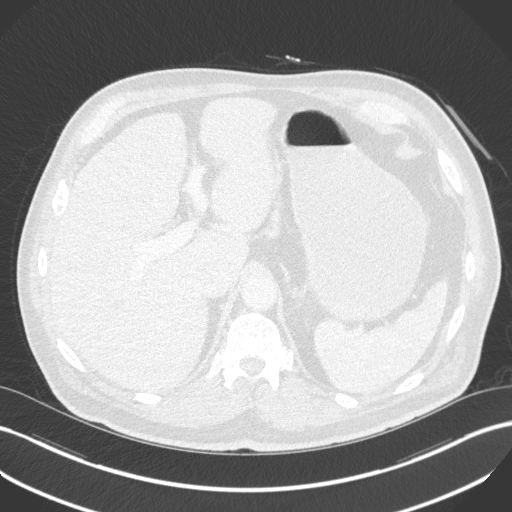
[im 51/172  lung]
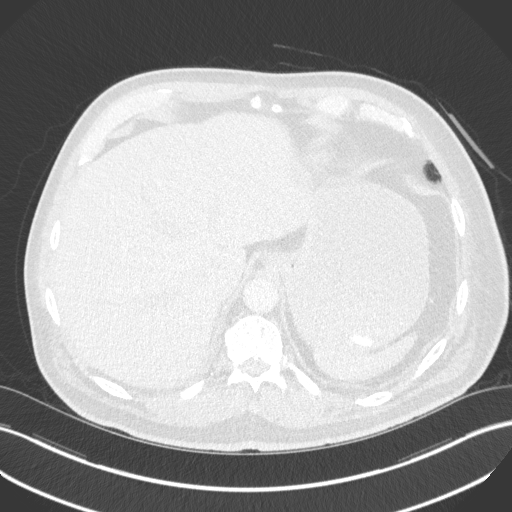
[im 64/172  mediastinal]
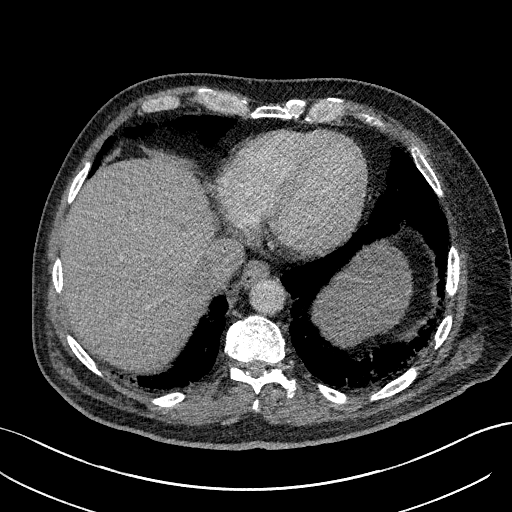
[im 64/172  lung]
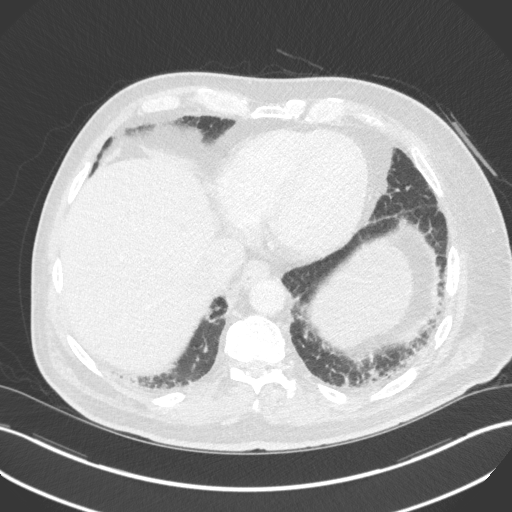
[im 77/172  lung]
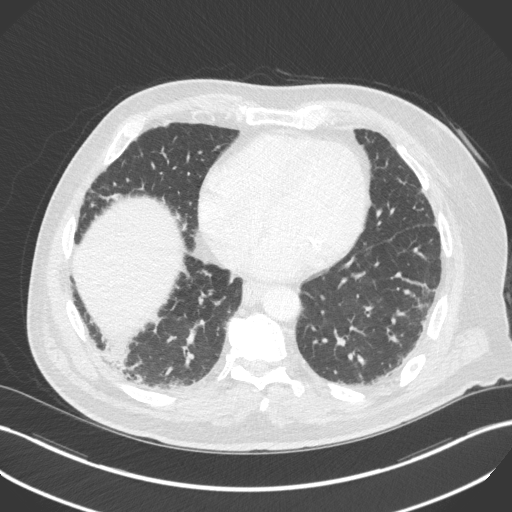
[im 96/172  lung]
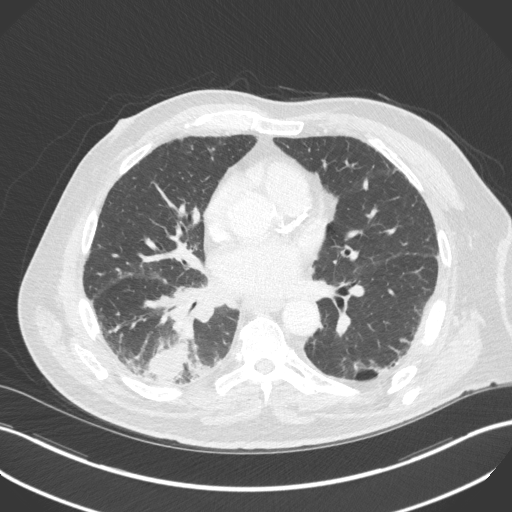
[im 108/172  lung]
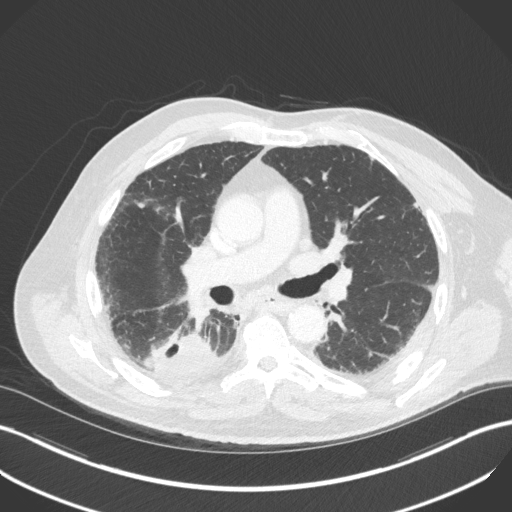
[im 121/172  mediastinal]
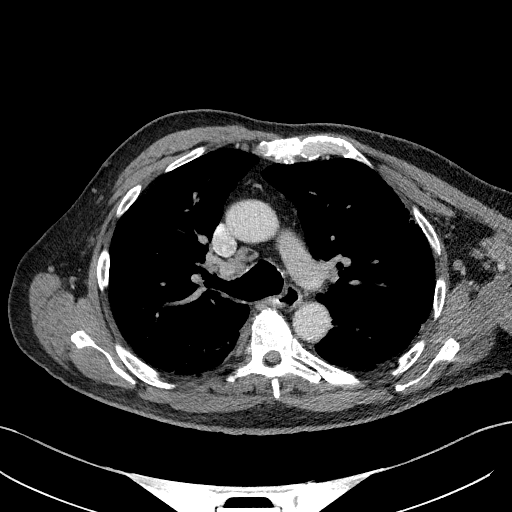
[im 121/172  lung]
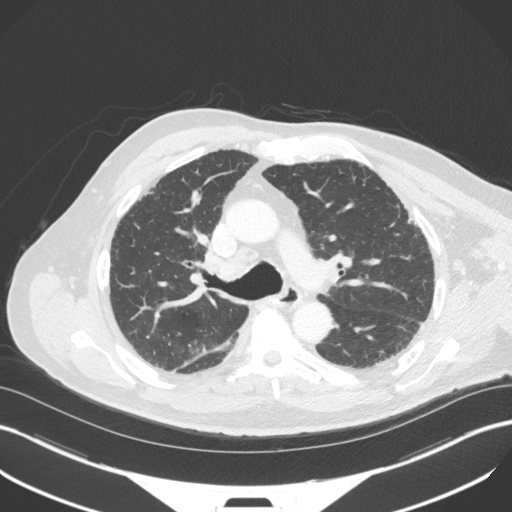
[im 134/172  lung]
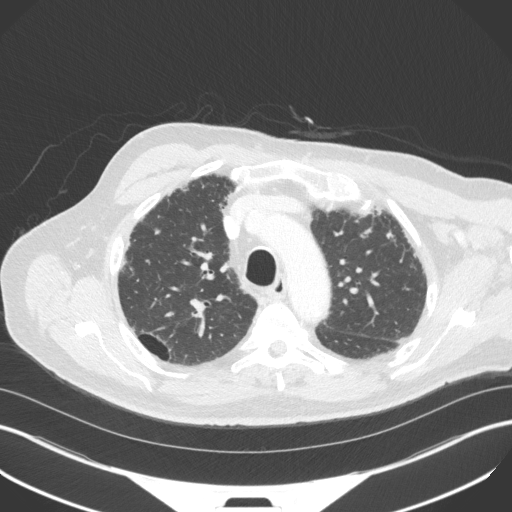
[im 146/172  lung]
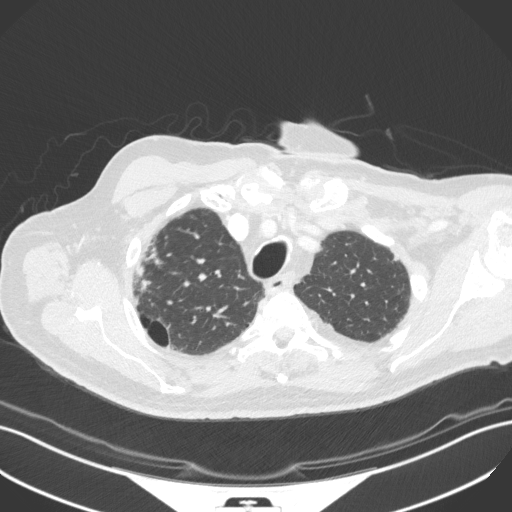
[im 159/172  lung]
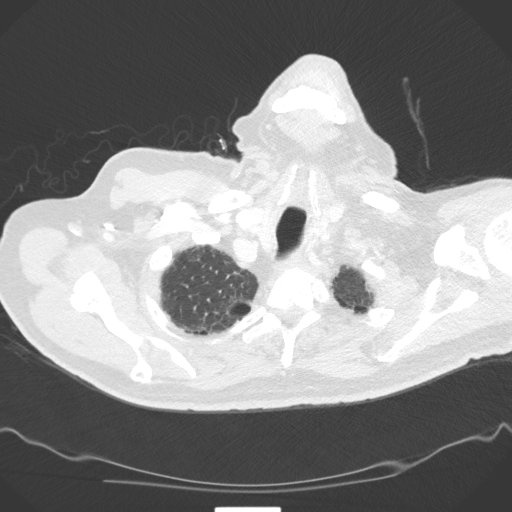

[Series 5: coronal · coronal · 0.67mm/px · 3 of 126 slices shown]
[im 26/126  lung]
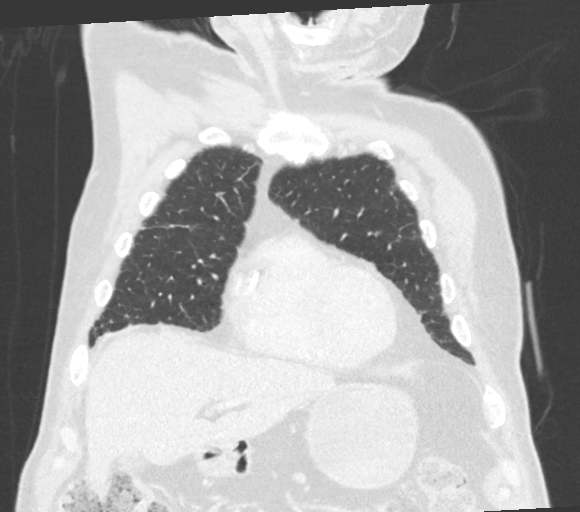
[im 51/126  lung]
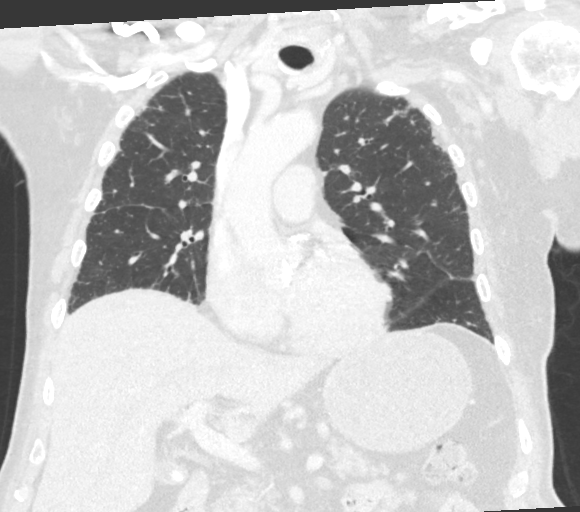
[im 76/126  lung]
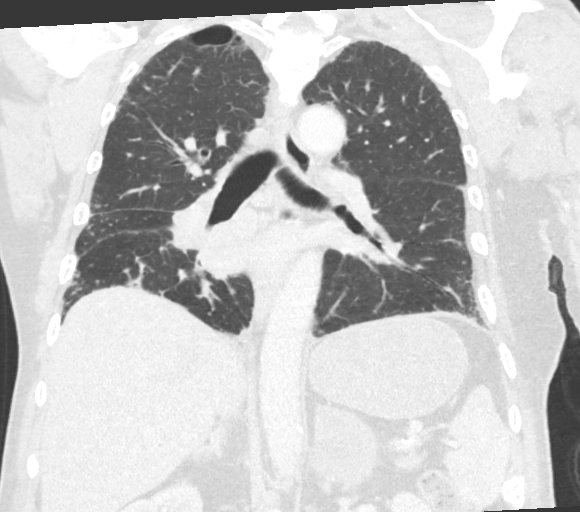

[15 of 36 positions shown; findings below may reference images not displayed]

FINDINGS: Cardiovascular: Suboptimal contrast bolus. Limited luminal
evaluation of the right lower lobe pulmonary artery. There is
diffuse atherosclerosis of the aorta, great vessels and coronary
arteries. Aortic valvular calcifications are present. The heart size
is normal. There is no pericardial effusion.

Mediastinum/Nodes: There is persistent subcarinal and right hilar
adenopathy. Subcarinal nodes measure 13 mm short axis on image 63
and 15 mm on image 69. There is confluent right hilar adenopathy,
difficult to differentiate from the poorly opacified pulmonary
vessels. The thyroid gland, trachea and esophagus demonstrate no
significant findings.

Lungs/Pleura: There is no pleural effusion. Centrilobular and
paraseptal emphysema again noted with subpleural reticulation and
scattered parenchymal scarring. There is perifissural nodularity in
both lungs. The dominant, partially cavitary mass in the superior
segment of the right lower lobe has slightly enlarged, measuring
x 4.1 cm on image 68.

Upper abdomen: There are bilateral adrenal masses worrisome for
metastatic disease. These measure 5.2 x 3.5 cm on the left (image
146) and 3.0 x 1.9 cm on the right (image 154). The adrenal glands
were not fully imaged previously. There is a calcified gallstone. No
definite hepatic abnormality, although hepatic evaluation limited by
suboptimal contrast bolus.

Musculoskeletal/Chest wall: There is no chest wall mass or
suspicious osseous finding. Old left-sided rib fractures are noted.
There is posttraumatic deformity of the left clavicle.
IMPRESSION: 1. Progressive enlargement of cavitary right lower lobe mass highly
worrisome for squamous cell carcinoma.
2. Associated right hilar and subcarinal adenopathy consistent with
metastatic disease. Bilateral adrenal masses are also worrisome for
metastatic disease. Hepatic evaluation limited.
3. Underlying chronic lung disease with emphysema and subpleural
reticulation.
4. Diffuse atherosclerosis. Aortic Atherosclerosis ([id number]).
Aortic valvular calcifications.
5. Tissue sampling of the right lower lobe mass and/or PET-CT
recommended for further evaluation.
